# Patient Record
Sex: Female | Born: 1980 | Race: Black or African American | Hispanic: No | Marital: Single | Smoking: Never smoker
Health system: Southern US, Community
[De-identification: ages and names within clinical notes are randomized; demographics above are authoritative.]

## PROBLEM LIST (undated history)

## (undated) DIAGNOSIS — R011 Cardiac murmur, unspecified: Secondary | ICD-10-CM

## (undated) DIAGNOSIS — F329 Major depressive disorder, single episode, unspecified: Secondary | ICD-10-CM

## (undated) DIAGNOSIS — O159 Eclampsia, unspecified as to time period: Secondary | ICD-10-CM

## (undated) DIAGNOSIS — I10 Essential (primary) hypertension: Secondary | ICD-10-CM

## (undated) DIAGNOSIS — E669 Obesity, unspecified: Secondary | ICD-10-CM

## (undated) DIAGNOSIS — R7989 Other specified abnormal findings of blood chemistry: Secondary | ICD-10-CM

## (undated) DIAGNOSIS — F419 Anxiety disorder, unspecified: Secondary | ICD-10-CM

## (undated) DIAGNOSIS — T8859XA Other complications of anesthesia, initial encounter: Secondary | ICD-10-CM

## (undated) DIAGNOSIS — R682 Dry mouth, unspecified: Secondary | ICD-10-CM

## (undated) DIAGNOSIS — N62 Hypertrophy of breast: Secondary | ICD-10-CM

## (undated) DIAGNOSIS — E559 Vitamin D deficiency, unspecified: Secondary | ICD-10-CM

## (undated) DIAGNOSIS — J45909 Unspecified asthma, uncomplicated: Secondary | ICD-10-CM

## (undated) DIAGNOSIS — B9689 Other specified bacterial agents as the cause of diseases classified elsewhere: Secondary | ICD-10-CM

## (undated) DIAGNOSIS — M549 Dorsalgia, unspecified: Secondary | ICD-10-CM

## (undated) DIAGNOSIS — F32A Depression, unspecified: Secondary | ICD-10-CM

## (undated) DIAGNOSIS — D649 Anemia, unspecified: Secondary | ICD-10-CM

## (undated) DIAGNOSIS — Z789 Other specified health status: Secondary | ICD-10-CM

## (undated) DIAGNOSIS — T7840XA Allergy, unspecified, initial encounter: Secondary | ICD-10-CM

## (undated) DIAGNOSIS — Z91410 Personal history of adult physical and sexual abuse: Secondary | ICD-10-CM

## (undated) DIAGNOSIS — I499 Cardiac arrhythmia, unspecified: Secondary | ICD-10-CM

## (undated) DIAGNOSIS — R945 Abnormal results of liver function studies: Secondary | ICD-10-CM

## (undated) DIAGNOSIS — N760 Acute vaginitis: Secondary | ICD-10-CM

## (undated) DIAGNOSIS — R569 Unspecified convulsions: Secondary | ICD-10-CM

## (undated) HISTORY — DX: Allergy, unspecified, initial encounter: T78.40XA

## (undated) HISTORY — DX: Obesity, unspecified: E66.9

## (undated) HISTORY — DX: Major depressive disorder, single episode, unspecified: F32.9

## (undated) HISTORY — DX: Hypertrophy of breast: N62

## (undated) HISTORY — DX: Other specified bacterial agents as the cause of diseases classified elsewhere: B96.89

## (undated) HISTORY — DX: Vitamin D deficiency, unspecified: E55.9

## (undated) HISTORY — DX: Abnormal results of liver function studies: R94.5

## (undated) HISTORY — DX: Other specified bacterial agents as the cause of diseases classified elsewhere: N76.0

## (undated) HISTORY — DX: Dry mouth, unspecified: R68.2

## (undated) HISTORY — DX: Eclampsia, unspecified as to time period: O15.9

## (undated) HISTORY — DX: Essential (primary) hypertension: I10

## (undated) HISTORY — DX: Anxiety disorder, unspecified: F41.9

## (undated) HISTORY — DX: Unspecified asthma, uncomplicated: J45.909

## (undated) HISTORY — PX: EYE SURGERY: SHX253

## (undated) HISTORY — DX: Other specified health status: Z78.9

## (undated) HISTORY — DX: Depression, unspecified: F32.A

## (undated) HISTORY — DX: Other specified abnormal findings of blood chemistry: R79.89

## (undated) HISTORY — DX: Dorsalgia, unspecified: M54.9

## (undated) HISTORY — PX: TUBAL LIGATION: SHX77

## (undated) HISTORY — DX: Cardiac murmur, unspecified: R01.1

## (undated) HISTORY — DX: Personal history of adult physical and sexual abuse: Z91.410

---

## 2002-02-10 HISTORY — PX: EYE SURGERY: SHX253

## 2004-05-08 ENCOUNTER — Emergency Department: Payer: Self-pay | Admitting: Emergency Medicine

## 2005-04-15 ENCOUNTER — Ambulatory Visit: Payer: Self-pay

## 2005-04-16 ENCOUNTER — Ambulatory Visit: Payer: Self-pay

## 2008-10-09 ENCOUNTER — Emergency Department: Payer: Self-pay | Admitting: Emergency Medicine

## 2009-03-13 ENCOUNTER — Emergency Department: Payer: Self-pay | Admitting: Emergency Medicine
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
## 2010-01-13 ENCOUNTER — Ambulatory Visit: Payer: Self-pay | Admitting: Internal Medicine

## 2010-01-16 ENCOUNTER — Ambulatory Visit: Payer: Self-pay | Admitting: Family Medicine

## 2010-01-16 IMAGING — CR DG LUMBAR SPINE COMPLETE 4+V
1 series · 5 of 5 positions shown · non-contrast
Comparison: none

REASON FOR EXAM: low back pain
COMMENTS:

[Series 2: view not recorded · 0.17mm/px · 5 of 5 slices shown]
[im 1/5]
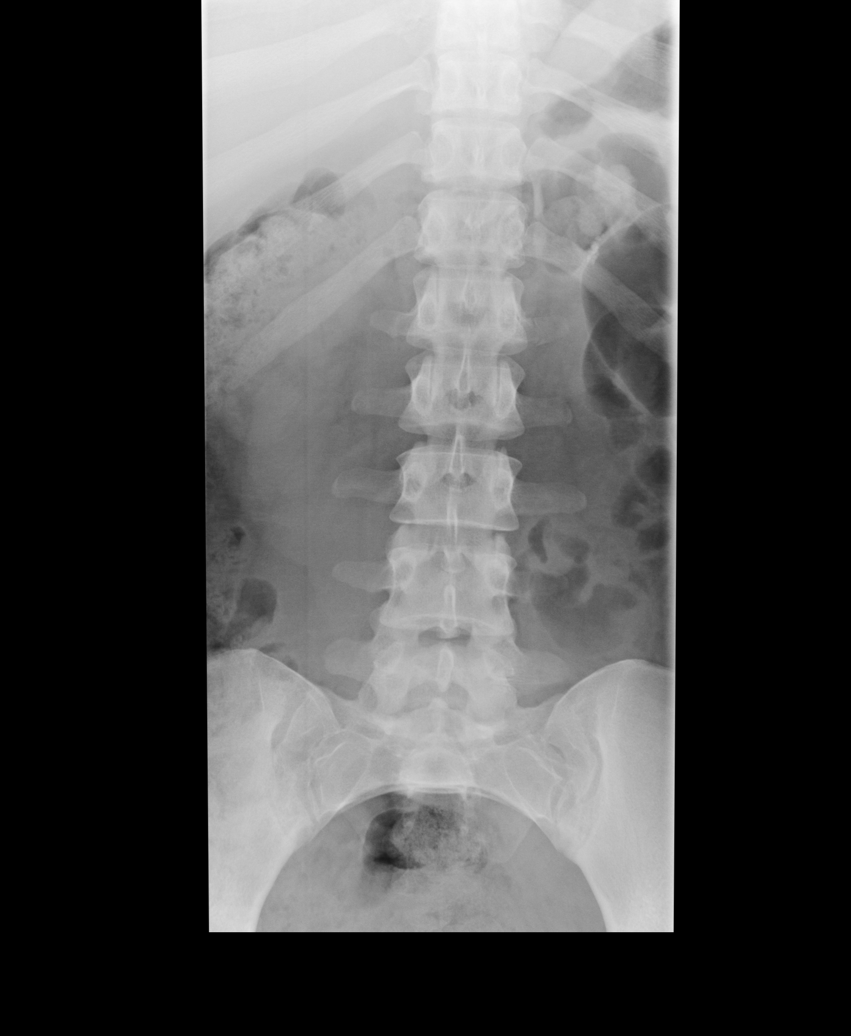
[im 2/5]
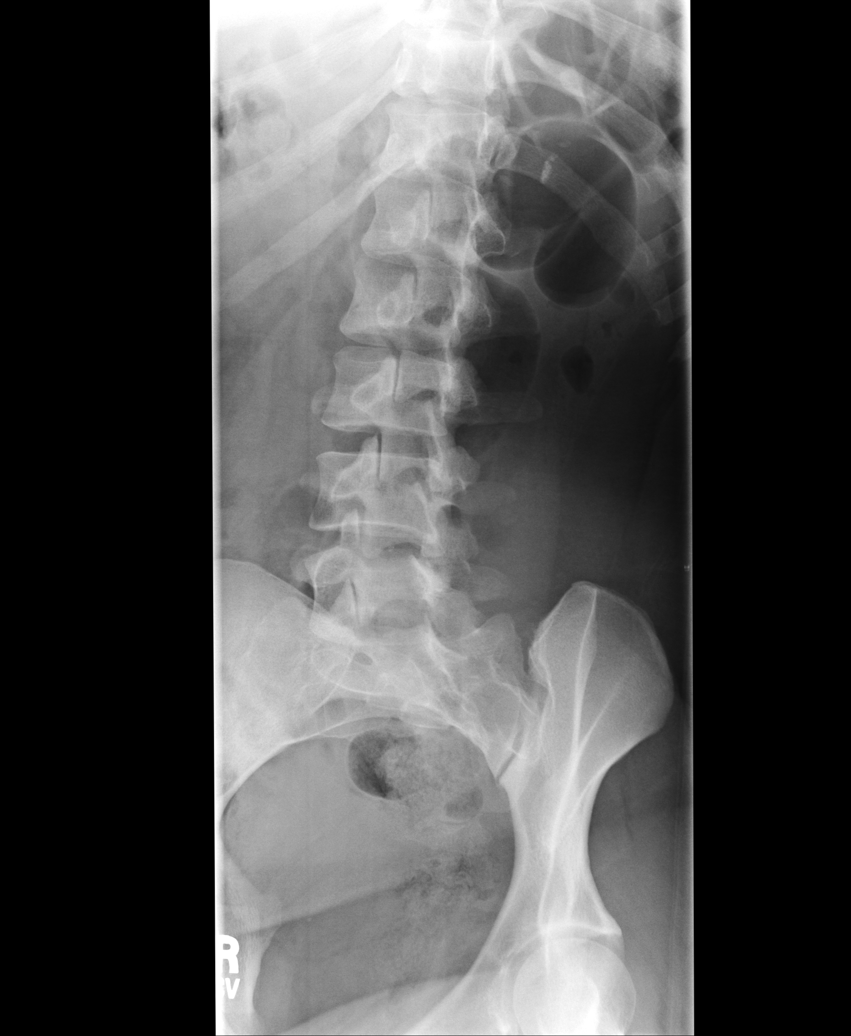
[im 3/5]
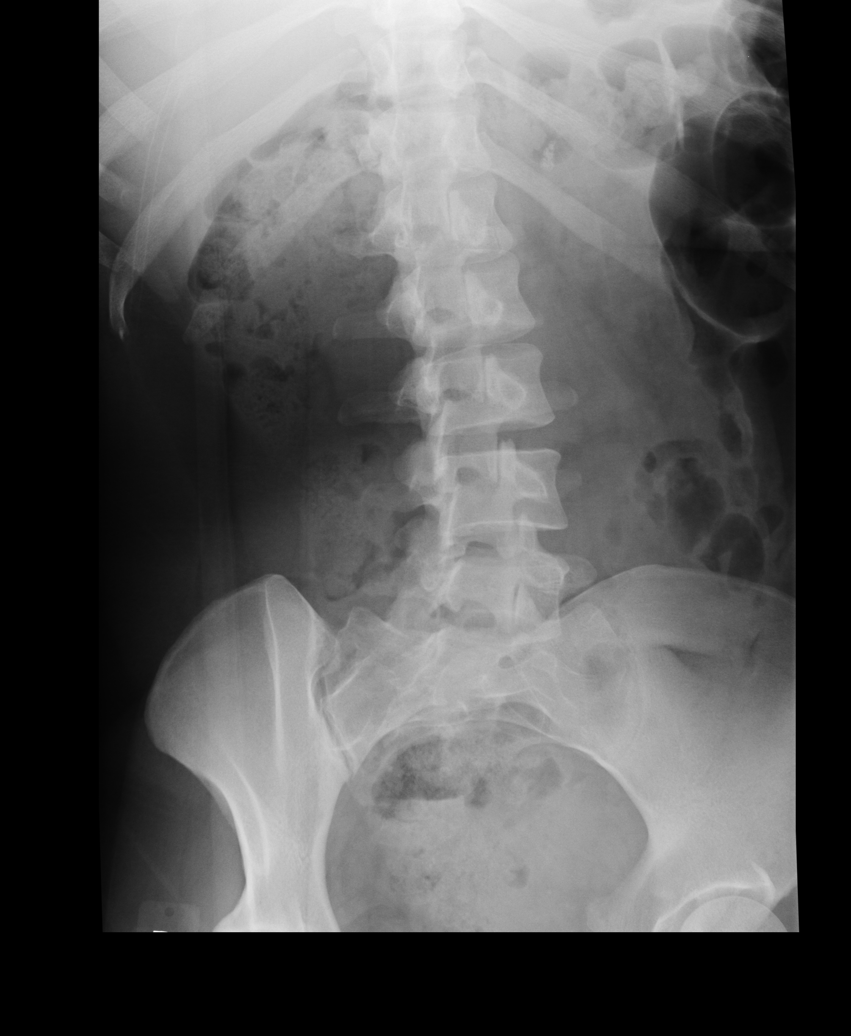
[im 4/5]
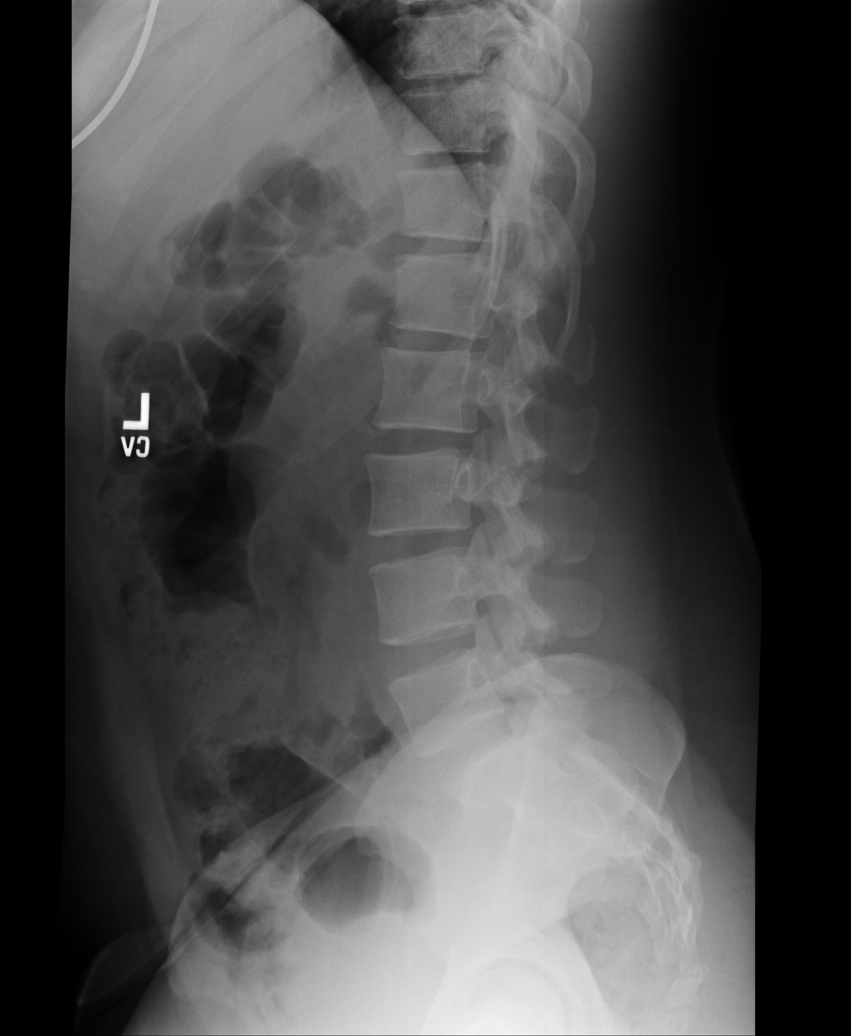
[im 5/5]
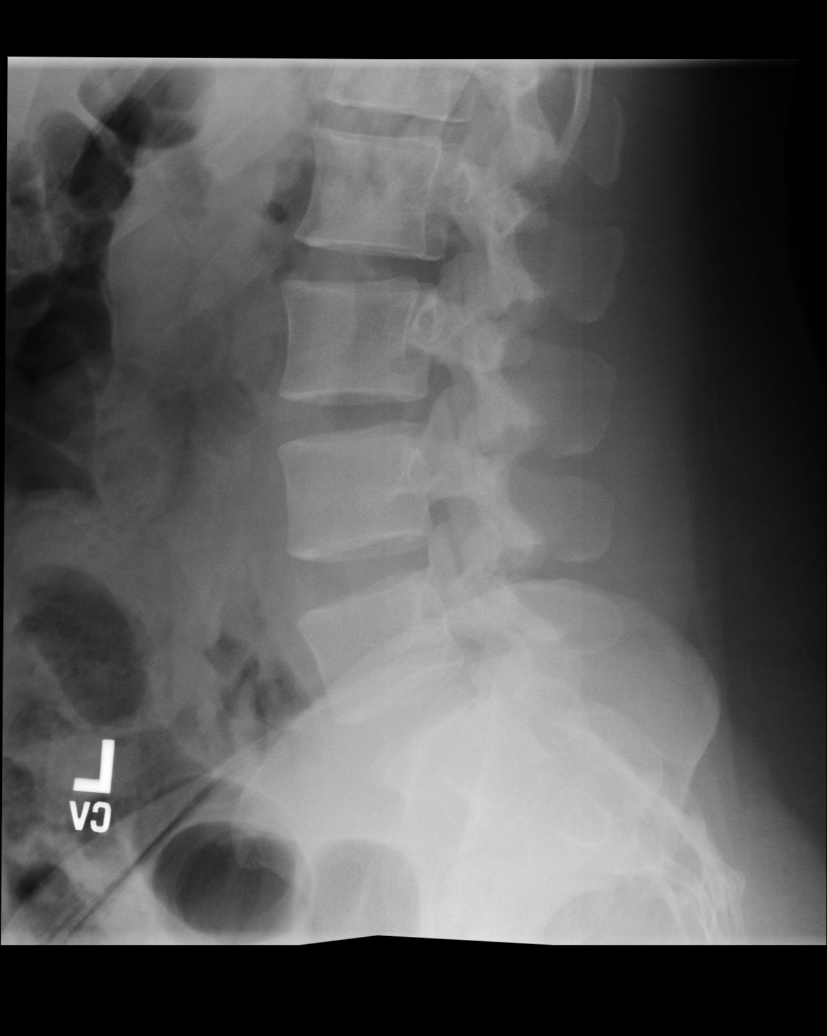

[5 of 5 positions shown; findings below may reference images not displayed]

PROCEDURE:     KDR - KDXR LUMBAR SPINE WITH OBLIQUES  - [DATE] [DATE]

RESULT:     The vertebral body heights and the intervertebral disc spaces
are well maintained. The vertebral body alignment is normal. Oblique views
show no significant abnormalities of the articular facets. The pedicles and
transverse processes are normal in appearance.
IMPRESSION: 1.     No significant abnormalities are noted.

## 2010-01-23 ENCOUNTER — Ambulatory Visit: Payer: Self-pay | Admitting: Family Medicine

## 2012-02-26 LAB — LIPID PANEL
CHOLESTEROL: 143 mg/dL (ref 0–200)
HDL: 44 mg/dL (ref 35–70)
LDL Cholesterol: 86 mg/dL
Triglycerides: 63 mg/dL (ref 40–160)

## 2012-02-26 LAB — HM PAP SMEAR: HM PAP: NORMAL

## 2012-09-29 ENCOUNTER — Emergency Department: Payer: Self-pay | Admitting: Internal Medicine
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
## 2014-12-15 ENCOUNTER — Encounter: Payer: Self-pay | Admitting: Family Medicine

## 2014-12-15 ENCOUNTER — Ambulatory Visit (INDEPENDENT_AMBULATORY_CARE_PROVIDER_SITE_OTHER): Payer: PRIVATE HEALTH INSURANCE | Admitting: Family Medicine

## 2014-12-15 VITALS — BP 132/80 | HR 98 | Temp 98.7°F | Resp 16 | Ht 60.0 in | Wt 193.5 lb

## 2014-12-15 DIAGNOSIS — N62 Hypertrophy of breast: Secondary | ICD-10-CM | POA: Diagnosis not present

## 2014-12-15 DIAGNOSIS — E669 Obesity, unspecified: Secondary | ICD-10-CM | POA: Diagnosis not present

## 2014-12-15 DIAGNOSIS — M546 Pain in thoracic spine: Secondary | ICD-10-CM

## 2014-12-15 DIAGNOSIS — R945 Abnormal results of liver function studies: Secondary | ICD-10-CM

## 2014-12-15 DIAGNOSIS — E559 Vitamin D deficiency, unspecified: Secondary | ICD-10-CM

## 2014-12-15 DIAGNOSIS — Z0000 Encounter for general adult medical examination without abnormal findings: Secondary | ICD-10-CM

## 2014-12-15 DIAGNOSIS — Z124 Encounter for screening for malignant neoplasm of cervix: Secondary | ICD-10-CM

## 2014-12-15 DIAGNOSIS — Z23 Encounter for immunization: Secondary | ICD-10-CM

## 2014-12-15 DIAGNOSIS — Z01419 Encounter for gynecological examination (general) (routine) without abnormal findings: Secondary | ICD-10-CM

## 2014-12-15 DIAGNOSIS — Z8759 Personal history of other complications of pregnancy, childbirth and the puerperium: Secondary | ICD-10-CM | POA: Insufficient documentation

## 2014-12-15 DIAGNOSIS — R7989 Other specified abnormal findings of blood chemistry: Secondary | ICD-10-CM | POA: Diagnosis not present

## 2014-12-15 DIAGNOSIS — I10 Essential (primary) hypertension: Secondary | ICD-10-CM | POA: Insufficient documentation

## 2014-12-15 NOTE — Progress Notes (Signed)
Name: Anita Newton   MRN: [id number]    DOB: May 05, 1980   Date:12/15/2014       Progress Note  Subjective  Chief Complaint  Chief Complaint  Patient presents with  . Annual Exam    HPI  Well woman : she states BV has been under control. S/p tubal ligation, no dyspareunia.   Large Breast: she is now working as a Corporate treasurer and her breast has been causing her to have a lot of back pain. Bra size is a G. She is wearing sports bra with under-wire and adjustable straps to control her symptoms, and at the end of the day she has indentation on both shoulder. Pain goes from mid back to her neck, pain is described as burning/pull sensation and feels tight.    Obesity: weight has been stable, working days and night shifts. Unable to lose weight. She tries to eat healthy.   Patient Active Problem List   Diagnosis Date Noted  . History of eclampsia 12/15/2014  . Elevated liver function tests 12/15/2014  . History of hypertension 12/15/2014  . Large breasts 12/15/2014  . Obesity (BMI 35.0-39.9 without comorbidity) ([place]) 12/15/2014  . Thoracic back pain 12/15/2014  . Vitamin D deficiency 12/15/2014    Past Surgical History  Procedure Laterality Date  . Tubal ligation    . Eye surgery      Family History  Problem Relation Age of Onset  . Cancer Mother     eye tumor  . Diabetes Mother   . Lupus Mother   . Alcohol abuse Father   . Drug abuse Father   . Eczema Daughter   . ADD / ADHD Daughter   . ADD / ADHD Son   . Allergic Disorder Son   . Lupus Sister   . Depression Sister   . Hypertension Brother     Social History   Social History  . Marital Status: Single    Spouse Name: N/A  . Number of Children: N/A  . Years of Education: N/A   Occupational History  . Not on file.   Social History Main Topics  . Smoking status: Never Smoker   . Smokeless tobacco: Never Used  . Alcohol Use: No  . Drug Use: No  . Sexual Activity: Not Currently   Other Topics  Concern  . Not on file   Social History Narrative  . No narrative on file     Current outpatient prescriptions:  Marland Kitchen  Multiple Vitamin (MULTIVITAMIN) tablet, Take 1 tablet by mouth daily., Disp: , Rfl:  .  vitamin C (ASCORBIC ACID) 500 MG tablet, Take 500 mg by mouth daily., Disp: , Rfl:   Allergies  Allergen Reactions  . Cherry Hives and Swelling     ROS  Constitutional: Negative for fever or weight change.  Respiratory: Negative for cough and shortness of breath.   Cardiovascular: Negative for chest pain or palpitations.  Gastrointestinal: Negative for abdominal pain, no bowel changes.  Musculoskeletal: Negative for gait problem or joint swelling.  Skin: Negative for rash.  Neurological: Negative for dizziness or headache.  No other specific complaints in a complete review of systems (except as listed in HPI above).  Objective  Filed Vitals:   12/15/14 1431  BP: 132/80  Pulse: 98  Temp: 98.7 F (37.1 C)  TempSrc: Oral  Resp: 16  Height: 5' (1.524 m)  Weight: 193 lb 8 oz (87.771 kg)  SpO2: 95%    Body mass index is 37.79 kg/(m^2).  Physical Exam  Constitutional: Patient appears well-developed and well-nourished, obese No distress.  HENT: Head: Normocephalic and atraumatic. Ears: B TMs ok, no erythema or effusion; Nose: Nose normal. Mouth/Throat: Oropharynx is clear and moist. No oropharyngeal exudate.  Eyes: Conjunctivae and EOM are normal. Pupils are equal, round, and reactive to light. No scleral icterus.  Neck: Normal range of motion. Neck supple. No JVD present. No thyromegaly present.  Cardiovascular: Normal rate, regular rhythm and normal heart sounds.  No murmur heard. No BLE edema. Pulmonary/Chest: Effort normal and breath sounds normal. No respiratory distress. Abdominal: Soft. Bowel sounds are normal, no distension. There is no tenderness. no masses Breast: no lumps or masses, no nipple discharge or rashes, large pendulous breast FEMALE GENITALIA:   External genitalia normal External urethra normal Vaginal vault normal without discharge or lesions Cervix normal without discharge or lesions Bimanual exam normal without masses RECTAL: no external hemorrhoids Musculoskeletal: Normal range of motion, no joint effusions. Indentation from bra on both shoulders, pain during palpation of paraspinal muscles of her thoracic spine, trapezium muscle also tender to touch Neurological: he is alert and oriented to person, place, and time. No cranial nerve deficit. Coordination, balance, strength, speech and gait are normal.  Skin: Skin is warm and dry. No rash noted. No erythema.  Psychiatric: Patient has a normal mood and affect. behavior is normal. Judgment and thought content normal.   PHQ2/9: Depression screen PHQ 2/9 12/15/2014  Decreased Interest 0  Down, Depressed, Hopeless 0  PHQ - 2 Score 0    Fall Risk: Fall Risk  12/15/2014  Falls in the past year? No    Functional Status Survey: Is the patient deaf or have difficulty hearing?: No Does the patient have difficulty seeing, even when wearing glasses/contacts?: Yes (glasses) Does the patient have difficulty concentrating, remembering, or making decisions?: No Does the patient have difficulty walking or climbing stairs?: No Does the patient have difficulty dressing or bathing?: No Does the patient have difficulty doing errands alone such as visiting a doctor's office or shopping?: No   Assessment & Plan  1. Well woman exam  Discussed importance of 150 minutes of physical activity weekly, eat two servings of fish weekly, eat one serving of tree nuts ( cashews, pistachios, pecans, almonds.Marland Kitchen) every other day, eat 6 servings of fruit/vegetables daily and drink plenty of water and avoid sweet beverages.   - HIV antibody - RPR - Comprehensive metabolic panel - CBC with Differential/Platelet - Lipid panel  2. Needs flu shot  - Flu Vaccine QUAD 36+ mos PF IM (Fluarix & Fluzone Quad  PF) -refused  3. Elevated liver function tests  Recheck labs  4. Large breasts  Discussed checking with insurance about coverage for mammoplasty, some insurances requires PT  5. Obesity (BMI 35.0-39.9 without comorbidity) ([place])  Discussed with the patient the risk posed by an increased BMI. Discussed importance of portion control, calorie counting and at least 150 minutes of physical activity weekly. Avoid sweet beverages and drink more water. Eat at least 6 servings of fruit and vegetables daily   6. Bilateral thoracic back pain  Likely from large breast, refer to PT  7. Vitamin D deficiency  - Vit D  25 hydroxy (rtn osteoporosis monitoring)  8. Cervical cancer screening  - Pap IG, CT/NG NAA, and HPV (high risk)

## 2014-12-18 ENCOUNTER — Telehealth: Payer: Self-pay | Admitting: Family Medicine

## 2014-12-18 NOTE — Telephone Encounter (Signed)
Pt needs a call back about a referral for breast reduction. Pt was told to call her insurance and that the Dr has to submit a prior approval first.

## 2014-12-18 NOTE — Telephone Encounter (Signed)
Please call patient, she will likely need to give me the name and number of the plastic surgeon  that she will see, and we can send our notes

## 2014-12-20 ENCOUNTER — Other Ambulatory Visit: Payer: Self-pay

## 2014-12-20 DIAGNOSIS — E559 Vitamin D deficiency, unspecified: Secondary | ICD-10-CM

## 2014-12-20 DIAGNOSIS — Z299 Encounter for prophylactic measures, unspecified: Secondary | ICD-10-CM

## 2014-12-20 LAB — LIPID PANEL
Cholesterol: 168 mg/dL (ref 0–200)
HDL: 55 mg/dL (ref 35–70)
LDL Cholesterol: 100 mg/dL
Triglycerides: 63 mg/dL (ref 40–160)

## 2014-12-20 NOTE — Addendum Note (Signed)
Addended by: Rudene Anda T on: 12/20/2014 08:41 AM   Modules accepted: Orders

## 2014-12-20 NOTE — Telephone Encounter (Signed)
Pt notified and will call back once she confirms who will be doing this procedure for her.

## 2014-12-20 NOTE — Progress Notes (Signed)
Patient came in to have blood drawn per her physician, Dr. Ancil Boozer.  Blood was drawn from right arm without any incident.  Pt wants results sent to Dr. Steele Sizer.

## 2014-12-20 NOTE — Telephone Encounter (Signed)
Pt called back and states her plastic surgeon Dr will be Dr Nicholaus Bloom at [facility]. Fax # is [phone]. Phone # is [phone].

## 2014-12-20 NOTE — Telephone Encounter (Signed)
Please send notes as requested. Thank you

## 2014-12-21 ENCOUNTER — Encounter: Payer: Self-pay | Admitting: Family Medicine

## 2014-12-21 DIAGNOSIS — F329 Major depressive disorder, single episode, unspecified: Secondary | ICD-10-CM | POA: Insufficient documentation

## 2014-12-21 DIAGNOSIS — F419 Anxiety disorder, unspecified: Secondary | ICD-10-CM | POA: Insufficient documentation

## 2014-12-21 LAB — CMP12+LP+TP+TSH+6AC+CBC/D/PLT
A/G RATIO: 1.4 (ref 1.1–2.5)
ALK PHOS: 73 IU/L (ref 39–117)
ALT: 24 IU/L (ref 0–32)
AST: 21 IU/L (ref 0–40)
Albumin: 4.2 g/dL (ref 3.5–5.5)
BASOS: 1 %
BILIRUBIN TOTAL: 0.3 mg/dL (ref 0.0–1.2)
BUN / CREAT RATIO: 17 (ref 8–20)
BUN: 12 mg/dL (ref 6–20)
Basophils Absolute: 0 10*3/uL (ref 0.0–0.2)
CHOL/HDL RATIO: 3.1 ratio (ref 0.0–4.4)
CREATININE: 0.72 mg/dL (ref 0.57–1.00)
Calcium: 9.2 mg/dL (ref 8.7–10.2)
Chloride: 99 mmol/L (ref 97–106)
Cholesterol, Total: 168 mg/dL (ref 100–199)
EOS (ABSOLUTE): 0.2 10*3/uL (ref 0.0–0.4)
EOS: 2 %
Estimated CHD Risk: <0.5 times avg. (ref 0.0–1.0)
Free Thyroxine Index: 2.6 (ref 1.2–4.9)
GFR calc Af Amer: 125 mL/min/{1.73_m2} (ref >59)
GFR, EST NON AFRICAN AMERICAN: 109 mL/min/{1.73_m2} (ref >59)
GGT: 30 IU/L (ref 0–60)
GLOBULIN, TOTAL: 3 g/dL (ref 1.5–4.5)
Glucose: 85 mg/dL (ref 65–99)
HDL: 55 mg/dL (ref >39)
HEMATOCRIT: 32.7 % — AB (ref 34.0–46.6)
Hemoglobin: 11 g/dL — ABNORMAL LOW (ref 11.1–15.9)
Immature Grans (Abs): 0 10*3/uL (ref 0.0–0.1)
Immature Granulocytes: 0 %
Iron: 62 ug/dL (ref 27–159)
LDH: 166 IU/L (ref 119–226)
LDL CALC: 100 mg/dL — AB (ref 0–99)
LYMPHS ABS: 2.5 10*3/uL (ref 0.7–3.1)
Lymphs: 39 %
MCH: 28.1 pg (ref 26.6–33.0)
MCHC: 33.6 g/dL (ref 31.5–35.7)
MCV: 84 fL (ref 79–97)
MONOS ABS: 0.5 10*3/uL (ref 0.1–0.9)
Monocytes: 8 %
NEUTROS PCT: 50 %
Neutrophils Absolute: 3.3 10*3/uL (ref 1.4–7.0)
PLATELETS: 365 10*3/uL (ref 150–379)
Phosphorus: 3.5 mg/dL (ref 2.5–4.5)
Potassium: 4.4 mmol/L (ref 3.5–5.2)
RBC: 3.91 x10E6/uL (ref 3.77–5.28)
RDW: 13.8 % (ref 12.3–15.4)
SODIUM: 136 mmol/L (ref 136–144)
T3 Uptake Ratio: 26 % (ref 24–39)
T4 TOTAL: 10.1 ug/dL (ref 4.5–12.0)
TRIGLYCERIDES: 63 mg/dL (ref 0–149)
TSH: 2.2 u[IU]/mL (ref 0.450–4.500)
Total Protein: 7.2 g/dL (ref 6.0–8.5)
Uric Acid: 5.6 mg/dL (ref 2.5–7.1)
VLDL Cholesterol Cal: 13 mg/dL (ref 5–40)
WBC: 6.5 10*3/uL (ref 3.4–10.8)

## 2014-12-21 LAB — VITAMIN D 25 HYDROXY (VIT D DEFICIENCY, FRACTURES): VIT D 25 HYDROXY: 16.1 ng/mL — AB (ref 30.0–100.0)

## 2014-12-21 LAB — HIV ANTIBODY (ROUTINE TESTING W REFLEX): HIV SCREEN 4TH GENERATION: NONREACTIVE

## 2014-12-21 LAB — RPR QUALITATIVE: RPR: NONREACTIVE

## 2014-12-21 NOTE — Telephone Encounter (Signed)
Notes have been faxed to the # below

## 2014-12-22 ENCOUNTER — Ambulatory Visit (INDEPENDENT_AMBULATORY_CARE_PROVIDER_SITE_OTHER): Payer: PRIVATE HEALTH INSURANCE | Admitting: Family Medicine

## 2014-12-22 ENCOUNTER — Encounter: Payer: Self-pay | Admitting: Family Medicine

## 2014-12-22 VITALS — BP 134/92 | HR 91 | Temp 98.5°F | Resp 18 | Ht 60.0 in | Wt 194.5 lb

## 2014-12-22 DIAGNOSIS — I10 Essential (primary) hypertension: Secondary | ICD-10-CM

## 2014-12-22 DIAGNOSIS — N920 Excessive and frequent menstruation with regular cycle: Secondary | ICD-10-CM

## 2014-12-22 DIAGNOSIS — D649 Anemia, unspecified: Secondary | ICD-10-CM

## 2014-12-22 DIAGNOSIS — E559 Vitamin D deficiency, unspecified: Secondary | ICD-10-CM

## 2014-12-22 LAB — PAP IG, CT-NG NAA, HPV HIGH-RISK
HPV, HIGH-RISK: NEGATIVE
PAP Smear Comment: 0

## 2014-12-22 MED ORDER — NORGESTIMATE-ETH ESTRADIOL 0.25-35 MG-MCG PO TABS: 1.0000 | ORAL_TABLET | Freq: Every day | ORAL | Status: DC

## 2014-12-22 MED ORDER — VITAMIN D (ERGOCALCIFEROL) 1.25 MG (50000 UNIT) PO CAPS: 50000.0000 [IU] | ORAL_CAPSULE | ORAL | Status: DC

## 2014-12-22 MED ORDER — FERROUS SULFATE 325 (65 FE) MG PO TABS: 325.0000 mg | ORAL_TABLET | Freq: Two times a day (BID) | ORAL | Status: DC

## 2014-12-22 NOTE — Progress Notes (Signed)
Lab results were faxed to Dr. Ancil Boozer at [facility] per patient's request.

## 2014-12-22 NOTE — Progress Notes (Signed)
Name: Anita Newton   MRN: [id number]    DOB: [birth year]   Date:12/22/2014       Progress Note  Subjective  Chief Complaint  Chief Complaint  Patient presents with  . Labs Only    discuss labs low vitamin D    HPI  Vitamin D deficiency: level was 16, she is tired all the time, no bone pains.  Anemia: hgb was 11, she has fatigue. She has an iron rich diet  Menorrhagia: she had tubal ligation in 2003 and stopped using contraception. The following year she developed heavy cycles, with clots for 5 days and light for a couple more days. She is tired of bleeding and would like to consider going back on ocp's.   Large Breast: having back pain, referral made for PT, will start on Monday, will likely benefit from breast reduction surgery  Patient Active Problem List   Diagnosis Date Noted  . Adiposity 12/21/2014  . Anxiety and depression 12/21/2014  . History of eclampsia 12/15/2014  . Hypertension, benign 12/15/2014  . Large breasts 12/15/2014  . Obesity (BMI 35.0-39.9 without comorbidity) ([place]) 12/15/2014  . Thoracic back pain 12/15/2014  . Vitamin D deficiency 12/15/2014    Past Surgical History  Procedure Laterality Date  . Tubal ligation    . Eye surgery      Family History  Problem Relation Age of Onset  . Cancer Mother     eye tumor  . Diabetes Mother   . Lupus Mother   . Alcohol abuse Father   . Drug abuse Father   . Eczema Daughter   . ADD / ADHD Daughter   . ADD / ADHD Son   . Allergic Disorder Son   . Lupus Sister   . Depression Sister   . Hypertension Brother     Social History   Social History  . Marital Status: Single    Spouse Name: N/A  . Number of Children: N/A  . Years of Education: N/A   Occupational History  . Not on file.   Social History Main Topics  . Smoking status: Never Smoker   . Smokeless tobacco: Never Used  . Alcohol Use: No  . Drug Use: No  . Sexual Activity: Not Currently   Other Topics Concern  . Not on file    Social History Narrative     Current outpatient prescriptions:  .  ibuprofen (ADVIL,MOTRIN) 600 MG tablet, Take by mouth., Disp: , Rfl:  .  ferrous sulfate 325 (65 FE) MG tablet, Take 1 tablet (325 mg total) by mouth 2 (two) times daily with a meal., Disp: 60 tablet, Rfl: 2 .  Multiple Vitamin (MULTIVITAMIN) tablet, Take 1 tablet by mouth daily., Disp: , Rfl:  .  norgestimate-ethinyl estradiol (ORTHO-CYCLEN, 28,) 0.25-35 MG-MCG tablet, Take 1 tablet by mouth daily., Disp: 1 Package, Rfl: 11 .  vitamin C (ASCORBIC ACID) 500 MG tablet, Take 500 mg by mouth daily., Disp: , Rfl:  .  Vitamin D, Ergocalciferol, (DRISDOL) 50000 UNITS CAPS capsule, Take 1 capsule (50,000 Units total) by mouth every 7 (seven) days., Disp: 12 capsule, Rfl: 0  Allergies  Allergen Reactions  . Cherry Hives and Swelling     ROS  Ten systems reviewed and is negative except as mentioned in HPI   Objective  Filed Vitals:   12/22/14 1145  BP: 136/98  Pulse: 91  Temp: 98.5 F (36.9 C)  TempSrc: Oral  Resp: 18  Height: 5' (1.524 m)  Weight: 194  lb 8 oz (88.225 kg)  SpO2: 95%    Body mass index is 37.99 kg/(m^2).  Physical Exam  Constitutional: Patient appears well-developed and well-nourished. ObeseNo distress.  HEENT: head atraumatic, normocephalic, pupils equal and reactive to light,aneck supple, but tender to palpation posteriorly , throat within normal limits Cardiovascular: Normal rate, regular rhythm and normal heart sounds.  No murmur heard. No BLE edema. Pulmonary/Chest: Effort normal and breath sounds normal. No respiratory distress. Abdominal: Soft.  There is no tenderness. Psychiatric: Patient has a normal mood and affect. behavior is normal. Judgment and thought content normal.  Recent Results (from the past 2160 hour(s))  Pap IG, CT/NG NAA, and HPV (high risk)     Status: None   Collection Time: 12/18/14 12:00 AM  Result Value Ref Range   DIAGNOSIS: Comment     Comment: NEGATIVE FOR  INTRAEPITHELIAL LESION AND MALIGNANCY.   Specimen adequacy: Comment     Comment: Satisfactory for evaluation. Endocervical and/or squamous metaplastic cells (endocervical component) are present.    CLINICIAN PROVIDED ICD10: Comment     Comment: Z12.4   Performed by: Comment     Comment: Windell Norfolk, Cytotechnologist (ASCP)   PAP SMEAR COMMENT .    Note: Comment     Comment: The Pap smear is a screening test designed to aid in the detection of premalignant and malignant conditions of the uterine cervix.  It is not a diagnostic procedure and should not be used as the sole means of detecting cervical cancer.  Both false-positive and false-negative reports do occur.    Test Methodology Comment     Comment: This liquid based ThinPrep(R) pap test was screened with the use of an image guided system.    HPV, high-risk Negative Negative    Comment: This high-risk HPV test detects thirteen high-risk types (16/18/31/33/35/39/45/51/52/56/58/59/68) without differentiation.   Executive Panel     Status: Abnormal   Collection Time: 12/20/14  8:15 AM  Result Value Ref Range   Glucose 85 65 - 99 mg/dL   Uric Acid 5.6 2.5 - 7.1 mg/dL    Comment:            Therapeutic target for gout patients: <6.0   BUN 12 6 - 20 mg/dL   Creatinine, Ser 0.72 0.57 - 1.00 mg/dL   GFR calc non Af Amer 109 >59 mL/min/1.73   GFR calc Af Amer 125 >59 mL/min/1.73   BUN/Creatinine Ratio 17 8 - 20   Sodium 136 136 - 144 mmol/L   Potassium 4.4 3.5 - 5.2 mmol/L   Chloride 99 97 - 106 mmol/L   Calcium 9.2 8.7 - 10.2 mg/dL   Phosphorus 3.5 2.5 - 4.5 mg/dL   Total Protein 7.2 6.0 - 8.5 g/dL   Albumin 4.2 3.5 - 5.5 g/dL   Globulin, Total 3.0 1.5 - 4.5 g/dL   Albumin/Globulin Ratio 1.4 1.1 - 2.5   Bilirubin Total 0.3 0.0 - 1.2 mg/dL   Alkaline Phosphatase 73 39 - 117 IU/L   LDH 166 119 - 226 IU/L   AST 21 0 - 40 IU/L   ALT 24 0 - 32 IU/L   GGT 30 0 - 60 IU/L   Iron 62 27 - 159 ug/dL   Cholesterol, Total 168 100 -  199 mg/dL   Triglycerides 63 0 - 149 mg/dL   HDL 55 >39 mg/dL    Comment: According to ATP-III Guidelines, HDL-C >59 mg/dL is considered a negative risk factor for CHD.    VLDL Cholesterol  Cal 13 5 - 40 mg/dL   LDL Calculated 100 (H) 0 - 99 mg/dL   Chol/HDL Ratio 3.1 0.0 - 4.4 ratio units    Comment:                                   T. Chol/HDL Ratio                                             Men  Women                               1/2 Avg.Risk  3.4    3.3                                   Avg.Risk  5.0    4.4                                2X Avg.Risk  9.6    7.1                                3X Avg.Risk 23.4   11.0    Estimated CHD Risk  < 0.5 0.0 - 1.0  times avg.    Comment:                                   T. Chol/HDL Ratio                                             Men  Women                               1/2 Avg.Risk  3.4    3.3                                   Avg.Risk  5.0    4.4                                2X Avg.Risk  9.6    7.1                                3X Avg.Risk 23.4   11.0 The CHD Risk is based on the T. Chol/HDL ratio.  Other factors affect CHD Risk such as hypertension, smoking, diabetes, severe obesity, and family history of pre- mature CHD.    TSH 2.200 0.450 - 4.500 uIU/mL   T4, Total 10.1 4.5 - 12.0 ug/dL   T3 Uptake Ratio 26 24 - 39 %   Free Thyroxine Index 2.6 1.2 - 4.9   WBC 6.5 3.4 -  10.8 x10E3/uL   RBC 3.91 3.77 - 5.28 x10E6/uL   Hemoglobin 11.0 (L) 11.1 - 15.9 g/dL   Hematocrit 32.7 (L) 34.0 - 46.6 %   MCV 84 79 - 97 fL   MCH 28.1 26.6 - 33.0 pg   MCHC 33.6 31.5 - 35.7 g/dL   RDW 13.8 12.3 - 15.4 %   Platelets 365 150 - 379 x10E3/uL   Neutrophils 50 %   Lymphs 39 %   Monocytes 8 %   Eos 2 %   Basos 1 %   Neutrophils Absolute 3.3 1.4 - 7.0 x10E3/uL   Lymphocytes Absolute 2.5 0.7 - 3.1 x10E3/uL   Monocytes Absolute 0.5 0.1 - 0.9 x10E3/uL   EOS (ABSOLUTE) 0.2 0.0 - 0.4 x10E3/uL   Basophils Absolute 0.0 0.0 - 0.2 x10E3/uL    Immature Granulocytes 0 %   Immature Grans (Abs) 0.0 0.0 - 0.1 x10E3/uL  Vitamin D (25 hydroxy)     Status: Abnormal   Collection Time: 12/20/14  8:15 AM  Result Value Ref Range   Vit D, 25-Hydroxy 16.1 (L) 30.0 - 100.0 ng/mL    Comment: Vitamin D deficiency has been defined by the [place] and an Endocrine Society practice guideline as a level of serum 25-OH vitamin D less than 20 ng/mL (1,2). The Endocrine Society went on to further define vitamin D insufficiency as a level between 21 and 29 ng/mL (2). 1. IOM (Institute of Medicine). 2010. Dietary reference    intakes for calcium and D. [place]: The    [employer]. 2. Holick MF, Binkley [place], Bischoff-Ferrari HA, et al.    Evaluation, treatment, and prevention of vitamin D    deficiency: an Endocrine Society clinical practice    guideline. JCEM. 2011 Jul; 96(7):1911-30.   RPR     Status: None   Collection Time: 12/20/14  8:15 AM  Result Value Ref Range   RPR Ser Ql Non Reactive Non Reactive  HIV antibody     Status: None   Collection Time: 12/20/14  8:15 AM  Result Value Ref Range   HIV Screen 4th Generation wRfx Non Reactive Non Reactive   PHQ2/9: Depression screen PHQ 2/9 12/15/2014  Decreased Interest 0  Down, Depressed, Hopeless 0  PHQ - 2 Score 0     Fall Risk: Fall Risk  12/15/2014  Falls in the past year? No     Assessment & Plan  1. Hypertension, benign  She wants to hold off on resuming medication. She will monitor her bp at home and return sooner for follow up if needed.  2. Vitamin D deficiency  - Vitamin D, Ergocalciferol, (DRISDOL) 50000 UNITS CAPS capsule; Take 1 capsule (50,000 Units total) by mouth every 7 (seven) days.  Dispense: 12 capsule; Refill: 0  3. Anemia, unspecified anemia type  - Ferritin - Iron - ferrous sulfate 325 (65 FE) MG tablet; Take 1 tablet (325 mg total) by mouth 2 (two) times daily with a meal.  Dispense: 60 tablet; Refill: 2  4. Menorrhagia  with regular cycle  - norgestimate-ethinyl estradiol (ORTHO-CYCLEN, 28,) 0.25-35 MG-MCG tablet; Take 1 tablet by mouth daily.  Dispense: 1 Package; Refill: 11

## 2014-12-25 ENCOUNTER — Ambulatory Visit: Payer: Self-pay | Admitting: Physical Therapy

## 2014-12-25 ENCOUNTER — Ambulatory Visit: Payer: PRIVATE HEALTH INSURANCE | Attending: Family Medicine | Admitting: Physical Therapy

## 2014-12-25 ENCOUNTER — Encounter: Payer: Self-pay | Admitting: Physical Therapy

## 2014-12-25 DIAGNOSIS — M2560 Stiffness of unspecified joint, not elsewhere classified: Secondary | ICD-10-CM | POA: Insufficient documentation

## 2014-12-25 DIAGNOSIS — M546 Pain in thoracic spine: Secondary | ICD-10-CM | POA: Diagnosis not present
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
## 2015-01-01 ENCOUNTER — Other Ambulatory Visit: Payer: Self-pay

## 2015-01-01 DIAGNOSIS — D649 Anemia, unspecified: Secondary | ICD-10-CM

## 2015-01-03 ENCOUNTER — Encounter: Payer: Self-pay | Admitting: Physical Therapy

## 2015-01-03 ENCOUNTER — Ambulatory Visit: Payer: PRIVATE HEALTH INSURANCE | Admitting: Physical Therapy

## 2015-01-03 DIAGNOSIS — M2560 Stiffness of unspecified joint, not elsewhere classified: Secondary | ICD-10-CM

## 2015-01-03 DIAGNOSIS — M546 Pain in thoracic spine: Secondary | ICD-10-CM | POA: Diagnosis not present
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
## 2015-01-08 ENCOUNTER — Ambulatory Visit: Payer: PRIVATE HEALTH INSURANCE | Admitting: Physical Therapy

## 2015-01-08 DIAGNOSIS — M546 Pain in thoracic spine: Secondary | ICD-10-CM

## 2015-01-08 DIAGNOSIS — M2560 Stiffness of unspecified joint, not elsewhere classified: Secondary | ICD-10-CM
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
## 2015-01-17 ENCOUNTER — Ambulatory Visit: Payer: PRIVATE HEALTH INSURANCE | Attending: Family Medicine | Admitting: Physical Therapy

## 2015-01-17 ENCOUNTER — Encounter: Payer: Self-pay | Admitting: Physical Therapy

## 2015-01-17 DIAGNOSIS — M546 Pain in thoracic spine: Secondary | ICD-10-CM | POA: Diagnosis not present

## 2015-01-17 DIAGNOSIS — M2560 Stiffness of unspecified joint, not elsewhere classified: Secondary | ICD-10-CM | POA: Insufficient documentation
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
## 2015-01-22 ENCOUNTER — Ambulatory Visit: Payer: PRIVATE HEALTH INSURANCE | Admitting: Physical Therapy

## 2015-01-22 ENCOUNTER — Encounter: Payer: PRIVATE HEALTH INSURANCE | Admitting: Physical Therapy

## 2015-01-22 ENCOUNTER — Encounter: Payer: Self-pay | Admitting: Physical Therapy

## 2015-01-22 DIAGNOSIS — M546 Pain in thoracic spine: Secondary | ICD-10-CM | POA: Diagnosis not present

## 2015-01-22 DIAGNOSIS — M2560 Stiffness of unspecified joint, not elsewhere classified: Secondary | ICD-10-CM
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
## 2015-01-28 ENCOUNTER — Emergency Department: Payer: PRIVATE HEALTH INSURANCE

## 2015-01-28 ENCOUNTER — Emergency Department
Admission: EM | Admit: 2015-01-28 | Discharge: 2015-01-29 | Disposition: A | Discharge status: Home / Self Care | Payer: PRIVATE HEALTH INSURANCE | Attending: Emergency Medicine | Admitting: Emergency Medicine

## 2015-01-28 DIAGNOSIS — I10 Essential (primary) hypertension: Secondary | ICD-10-CM | POA: Diagnosis not present

## 2015-01-28 DIAGNOSIS — J189 Pneumonia, unspecified organism: Secondary | ICD-10-CM

## 2015-01-28 DIAGNOSIS — J159 Unspecified bacterial pneumonia: Secondary | ICD-10-CM | POA: Diagnosis not present

## 2015-01-28 DIAGNOSIS — J9801 Acute bronchospasm: Secondary | ICD-10-CM | POA: Insufficient documentation

## 2015-01-28 DIAGNOSIS — Z79899 Other long term (current) drug therapy: Secondary | ICD-10-CM | POA: Diagnosis not present

## 2015-01-28 DIAGNOSIS — J069 Acute upper respiratory infection, unspecified: Secondary | ICD-10-CM | POA: Diagnosis not present

## 2015-01-28 DIAGNOSIS — Z791 Long term (current) use of non-steroidal anti-inflammatories (NSAID): Secondary | ICD-10-CM | POA: Insufficient documentation

## 2015-01-28 DIAGNOSIS — R05 Cough: Secondary | ICD-10-CM | POA: Diagnosis present

## 2015-01-28 LAB — BASIC METABOLIC PANEL
Anion gap: 7 (ref 5–15)
BUN: 8 mg/dL (ref 6–20)
CALCIUM: 9.3 mg/dL (ref 8.9–10.3)
CO2: 26 mmol/L (ref 22–32)
CREATININE: 0.9 mg/dL (ref 0.44–1.00)
Chloride: 104 mmol/L (ref 101–111)
GFR calc non Af Amer: >60 mL/min (ref >60)
Glucose, Bld: 94 mg/dL (ref 65–99)
Potassium: 3.1 mmol/L — ABNORMAL LOW (ref 3.5–5.1)
SODIUM: 137 mmol/L (ref 135–145)

## 2015-01-28 LAB — CBC
HCT: 37.5 % (ref 35.0–47.0)
Hemoglobin: 12.7 g/dL (ref 12.0–16.0)
MCH: 29.1 pg (ref 26.0–34.0)
MCHC: 33.8 g/dL (ref 32.0–36.0)
MCV: 86 fL (ref 80.0–100.0)
PLATELETS: 286 10*3/uL (ref 150–440)
RBC: 4.36 MIL/uL (ref 3.80–5.20)
RDW: 13.6 % (ref 11.5–14.5)
WBC: 6.2 10*3/uL (ref 3.6–11.0)

## 2015-01-28 LAB — INFLUENZA PANEL BY PCR (TYPE A & B)
H1N1 flu by pcr: NOT DETECTED
INFLBPCR: NEGATIVE
Influenza A By PCR: NEGATIVE

## 2015-01-28 LAB — FIBRIN DERIVATIVES D-DIMER (ARMC ONLY): Fibrin derivatives D-dimer (ARMC): 1836 — ABNORMAL HIGH (ref 0–499)

## 2015-01-28 LAB — TROPONIN I

## 2015-01-28 IMAGING — CR DG CHEST 2V
2 series · 2 of 2 positions shown · non-contrast
Comparison: Chest radiograph dated [DATE]

CLINICAL DATA: 34-year-old female with fever cough and chills x3
days

EXAM:
CHEST  2 VIEW

[chest pa]
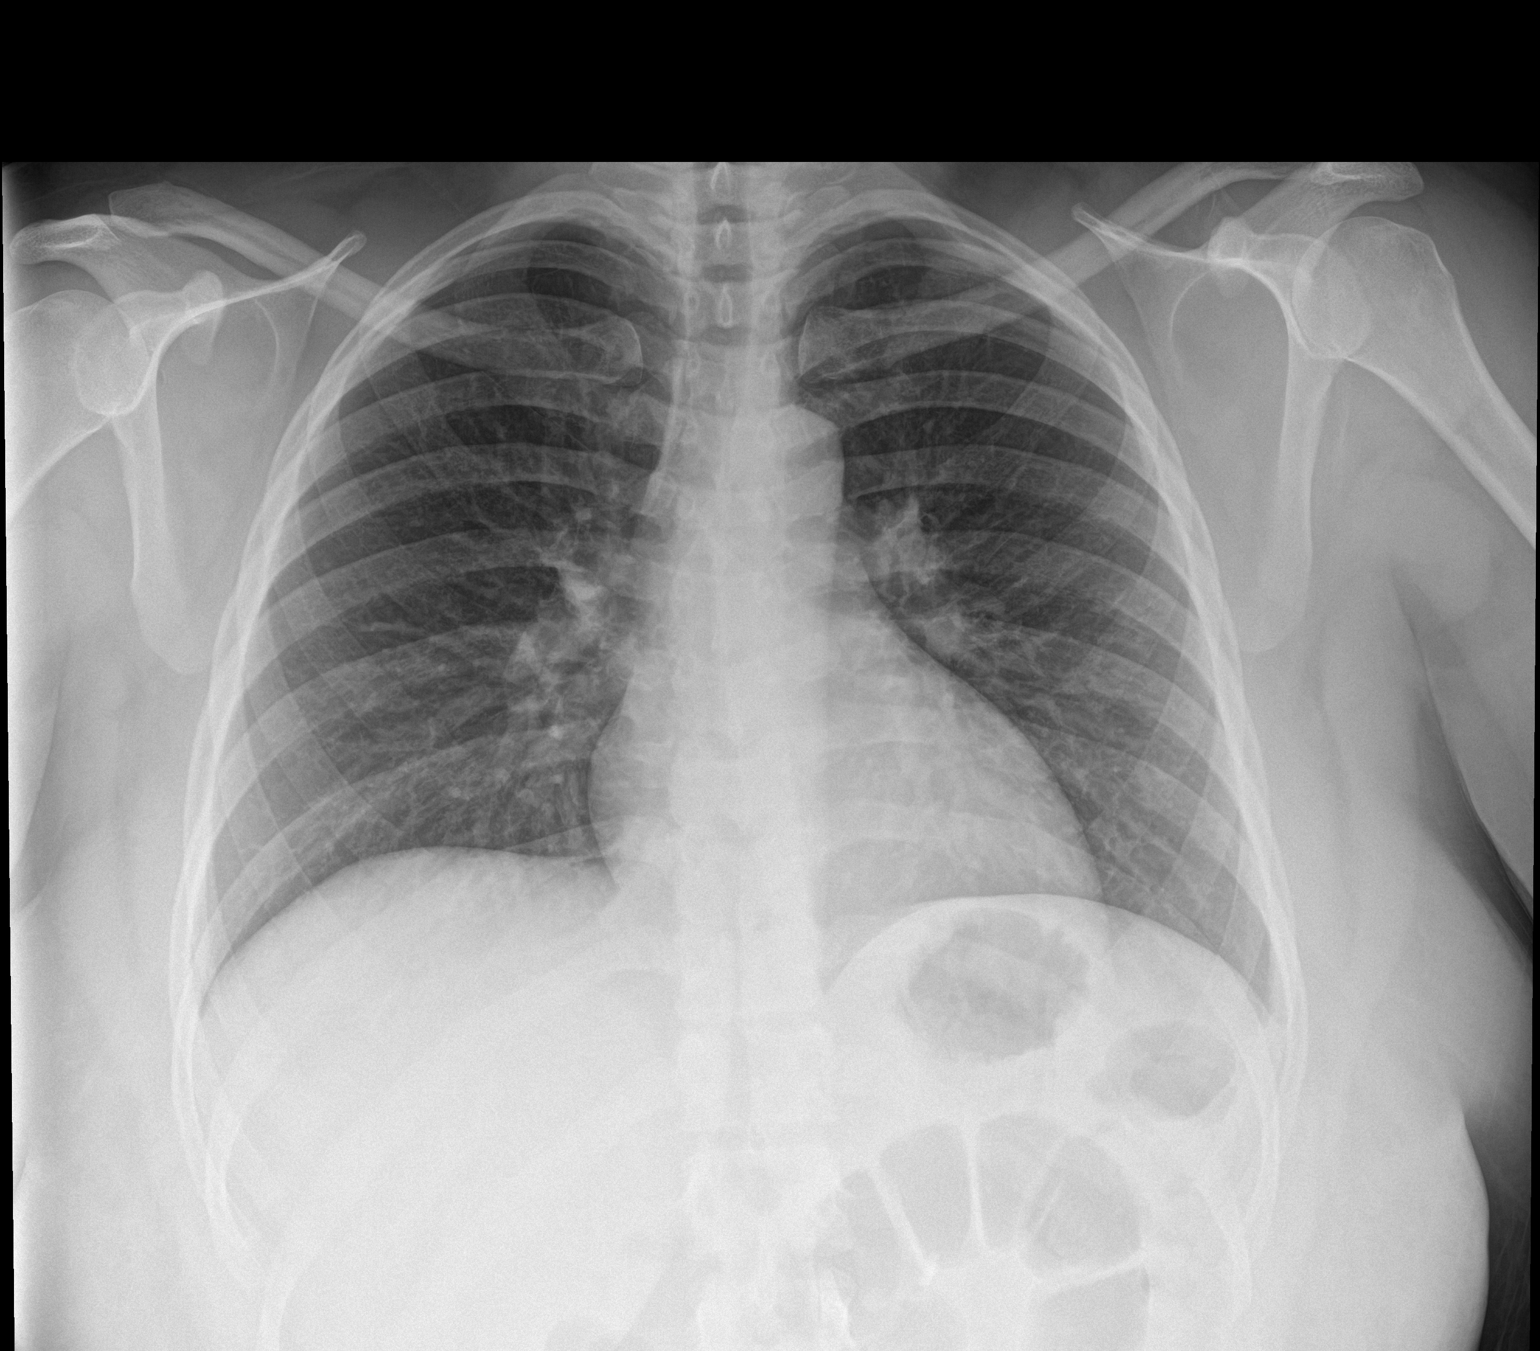

[chest lat]
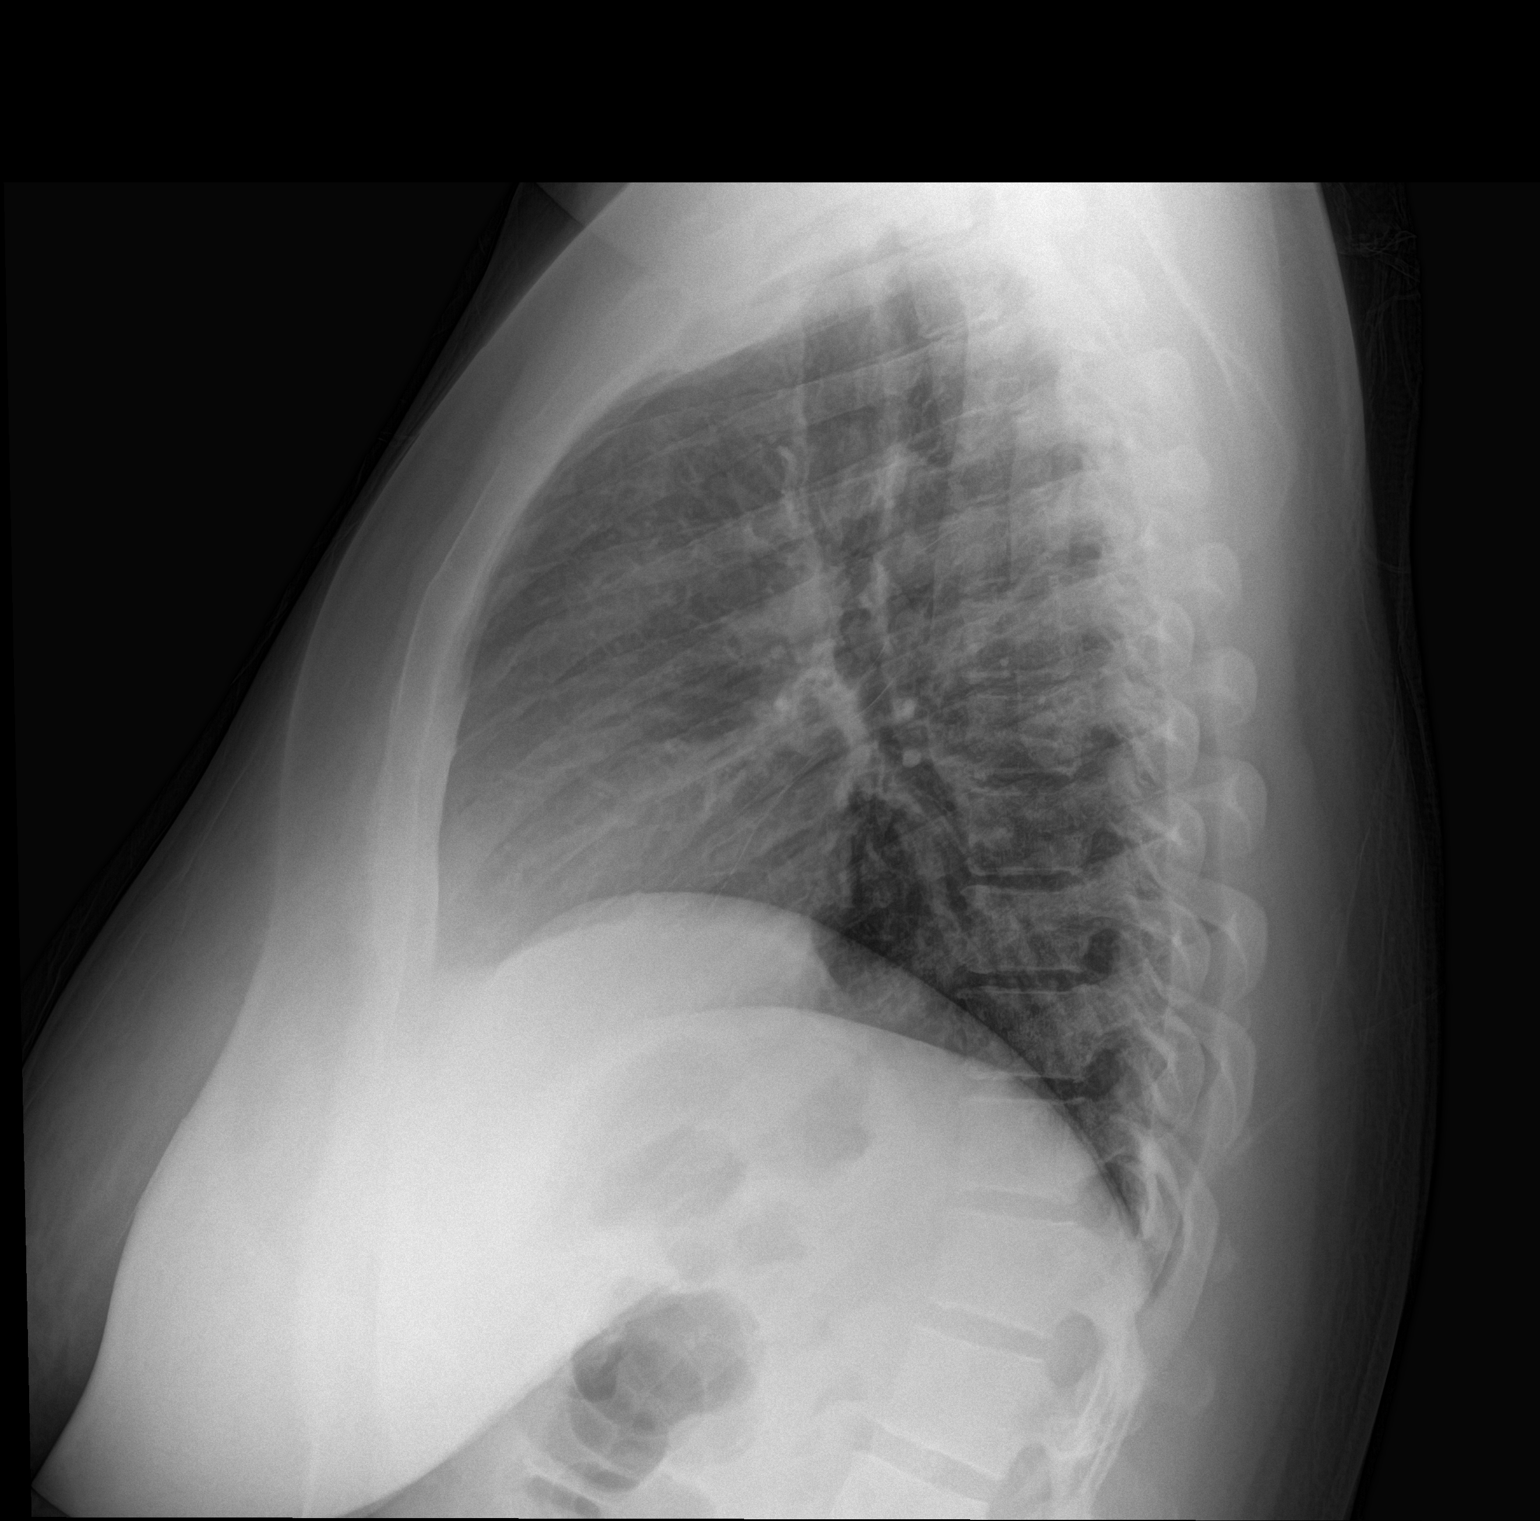

[2 of 2 positions shown; findings below may reference images not displayed]

FINDINGS: The heart size and mediastinal contours are within normal limits.
Both lungs are clear. The visualized skeletal structures are
unremarkable.
IMPRESSION: No active cardiopulmonary disease.

## 2015-01-28 MED ORDER — POTASSIUM CHLORIDE CRYS ER 20 MEQ PO TBCR
40.0000 meq | EXTENDED_RELEASE_TABLET | Freq: Once | ORAL | Status: AC
  Administered 2015-01-29: 40 meq via ORAL
  Filled 2015-01-28: qty 2

## 2015-01-28 MED ORDER — ALBUTEROL SULFATE HFA 108 (90 BASE) MCG/ACT IN AERS: 2.0000 | INHALATION_SPRAY | Freq: Four times a day (QID) | RESPIRATORY_TRACT | Status: DC | PRN

## 2015-01-28 MED ORDER — IPRATROPIUM-ALBUTEROL 0.5-2.5 (3) MG/3ML IN SOLN
3.0000 mL | Freq: Once | RESPIRATORY_TRACT | Status: AC
  Administered 2015-01-28: 3 mL via RESPIRATORY_TRACT
  Filled 2015-01-28 (×2): qty 3

## 2015-01-28 NOTE — ED Notes (Signed)
Patient transported to CT 
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
## 2015-01-28 NOTE — ED Notes (Signed)
Pt with 3 days of multiple complaints-fever, cough, congestion, shortness of breath, pain to the center of her chest, decreased appetite; talking in complete coherent sentences

## 2015-01-28 NOTE — Discharge Instructions (Signed)
You were evaluated for muscle aches, cough congestion, and found to have wheezing and treated with albuterol emergency department. You're being discharged with albuterol inhaler.  Return to the emergency department for any worsening condition including any trouble breathing, chest pain, dizziness or passing out, concern for dehydration such as not making urine, or any other symptoms concerning to you.   Bronchospasm, Adult A bronchospasm is a spasm or tightening of the airways going into the lungs. During a bronchospasm breathing becomes more difficult because the airways get smaller. When this happens there can be coughing, a whistling sound when breathing (wheezing), and difficulty breathing. Bronchospasm is often associated with asthma, but not all patients who experience a bronchospasm have asthma. CAUSES  A bronchospasm is caused by inflammation or irritation of the airways. The inflammation or irritation may be triggered by:   Allergies (such as to animals, pollen, food, or mold). Allergens that cause bronchospasm may cause wheezing immediately after exposure or many hours later.   Infection. Viral infections are believed to be the most common cause of bronchospasm.   Exercise.   Irritants (such as pollution, cigarette smoke, strong odors, aerosol sprays, and paint fumes).   Weather changes. Winds increase molds and pollens in the air. Rain refreshes the air by washing irritants out. Cold air may cause inflammation.   Stress and emotional upset.  SIGNS AND SYMPTOMS   Wheezing.   Excessive nighttime coughing.   Frequent or severe coughing with a simple cold.   Chest tightness.   Shortness of breath.  DIAGNOSIS  Bronchospasm is usually diagnosed through a history and physical exam. Tests, such as chest X-rays, are sometimes done to look for other conditions. TREATMENT   Inhaled medicines can be given to open up your airways and help you breathe. The medicines can be  given using either an inhaler or a nebulizer machine.  Corticosteroid medicines may be given for severe bronchospasm, usually when it is associated with asthma. HOME CARE INSTRUCTIONS   Always have a plan prepared for seeking medical care. Know when to call your health care provider and local emergency services (911 in the [place] Know where you can access local emergency care.  Only take medicines as directed by your health care provider.  If you were prescribed an inhaler or nebulizer machine, ask your health care provider to explain how to use it correctly. Always use a spacer with your inhaler if you were given one.  It is necessary to remain calm during an attack. Try to relax and breathe more slowly.  Control your home environment in the following ways:   Change your heating and air conditioning filter at least once a month.   Limit your use of fireplaces and wood stoves.  Do not smoke and do not allow smoking in your home.   Avoid exposure to perfumes and fragrances.   Get rid of pests (such as roaches and mice) and their droppings.   Throw away plants if you see mold on them.   Keep your house clean and dust free.   Replace carpet with wood, tile, or vinyl flooring. Carpet can trap dander and dust.   Use allergy-proof pillows, mattress covers, and box spring covers.   Wash bed sheets and blankets every week in hot water and dry them in a dryer.   Use blankets that are made of polyester or cotton.   Wash hands frequently. SEEK MEDICAL CARE IF:   You have muscle aches.   You have chest pain.  The sputum changes from clear or white to yellow, green, gray, or bloody.   The sputum you cough up gets thicker.   There are problems that may be related to the medicine you are given, such as a rash, itching, swelling, or trouble breathing.  SEEK IMMEDIATE MEDICAL CARE IF:   You have worsening wheezing and coughing even after taking your prescribed  medicines.   You have increased difficulty breathing.   You develop severe chest pain. MAKE SURE YOU:   Understand these instructions.  Will watch your condition.  Will get help right away if you are not doing well or get worse.   This information is not intended to replace advice given to you by your health care provider. Make sure you discuss any questions you have with your health care provider.   Document Released: 01/30/2003 Document Revised: 02/17/2014 Document Reviewed: 07/19/2012 Elsevier Interactive Patient Education 2016 Elsevier Inc.  Viral Infections A virus is a type of germ. Viruses can cause:  Minor sore throats.  Aches and pains.  Headaches.  Runny nose.  Rashes.  Watery eyes.  Tiredness.  Coughs.  Loss of appetite.  Feeling sick to your stomach (nausea).  Throwing up (vomiting).  Watery poop (diarrhea). HOME CARE   Only take medicines as told by your doctor.  Drink enough water and fluids to keep your pee (urine) clear or pale yellow. Sports drinks are a good choice.  Get plenty of rest and eat healthy. Soups and broths with crackers or rice are fine. GET HELP RIGHT AWAY IF:   You have a very bad headache.  You have shortness of breath.  You have chest pain or neck pain.  You have an unusual rash.  You cannot stop throwing up.  You have watery poop that does not stop.  You cannot keep fluids down.  You or your child has a temperature by mouth above 102 F (38.9 C), not controlled by medicine.  Your baby is older than 3 months with a rectal temperature of 102 F (38.9 C) or higher.  Your baby is 32 months old or younger with a rectal temperature of 100.4 F (38 C) or higher. MAKE SURE YOU:   Understand these instructions.  Will watch this condition.  Will get help right away if you are not doing well or get worse.   This information is not intended to replace advice given to you by your health care provider. Make  sure you discuss any questions you have with your health care provider.   Document Released: 01/10/2008 Document Revised: 04/21/2011 Document Reviewed: 07/05/2014 Elsevier Interactive Patient Education [employer].

## 2015-01-29 ENCOUNTER — Emergency Department: Payer: PRIVATE HEALTH INSURANCE

## 2015-01-29 IMAGING — CT CT ANGIO CHEST
1 of 2 series · 18 of 30 positions shown · IV contrast (APPLIED)
Comparison: None.

CLINICAL DATA: Upper respiratory congestion for 3 days with fever.
Tachycardia, shortness of breath, elevated D-dimer

EXAM:
CT ANGIOGRAPHY CHEST WITH CONTRAST
TECHNIQUE: Multidetector CT imaging of the chest was performed using the
standard protocol during bolus administration of intravenous
contrast. Multiplanar CT image reconstructions and MIPs were
obtained to evaluate the vascular anatomy.
CONTRAST:  100mL OMNIPAQUE IOHEXOL 350 MG/ML SOLN

[Series 5: pe 1.0 thins · axial · 0.65mm/px · z∈[-340,-142]mm · 18 of 225 slices shown]
[im 13/225  lung]
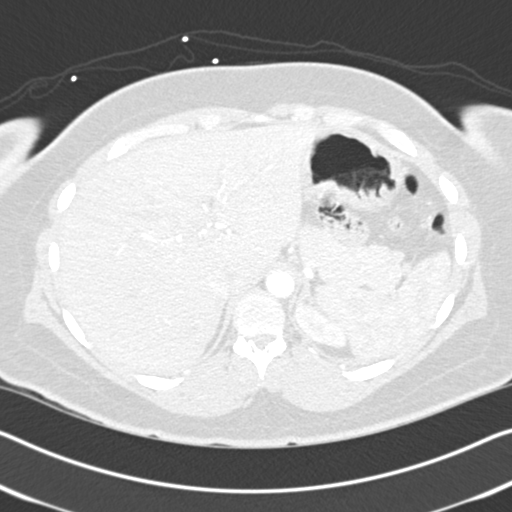
[im 25/225  mediastinal]
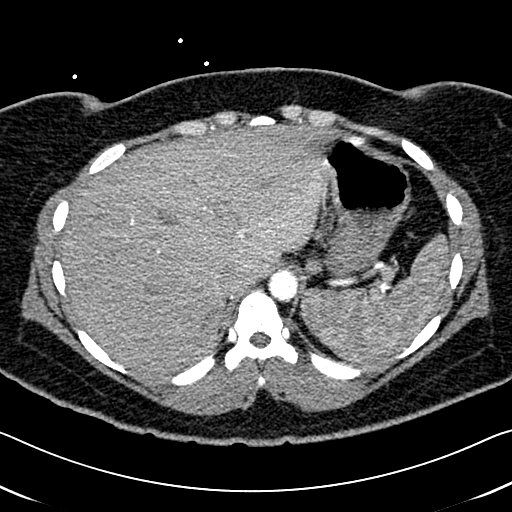
[im 38/225  lung]
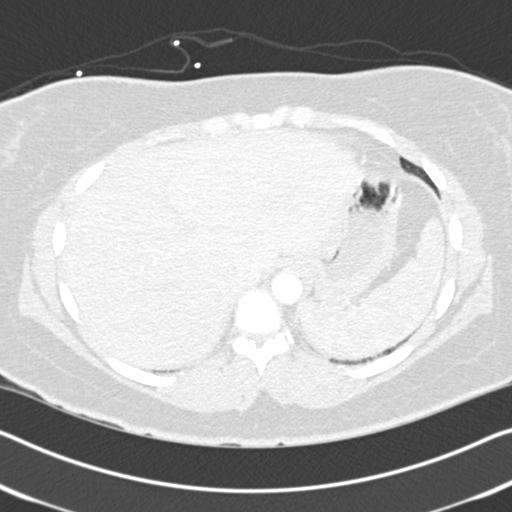
[im 50/225  mediastinal]
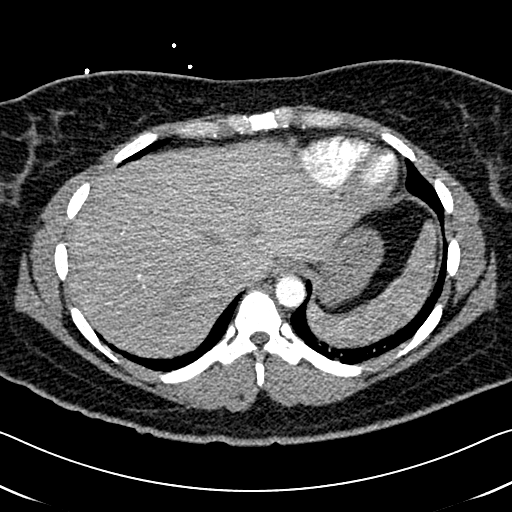
[im 63/225  lung]
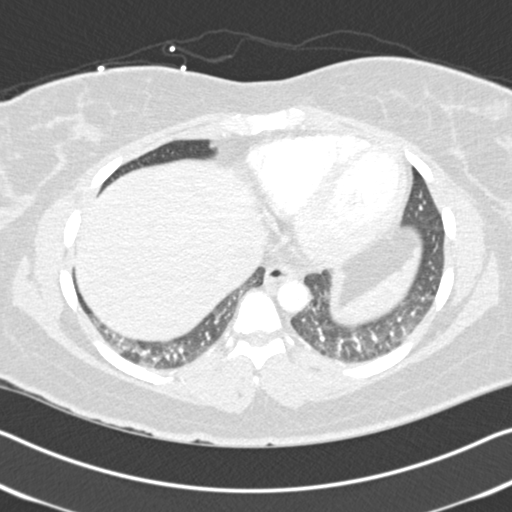
[im 75/225  mediastinal]
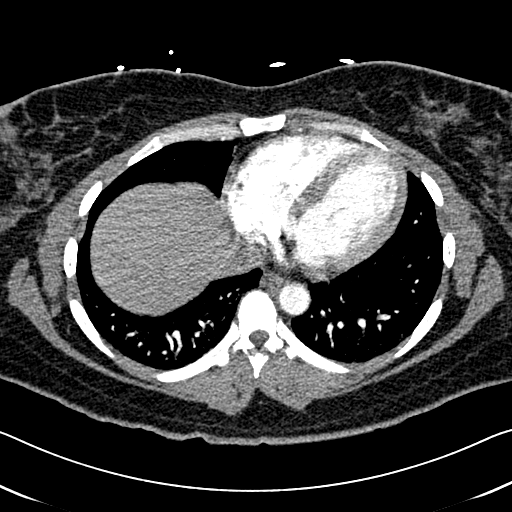
[im 88/225  lung]
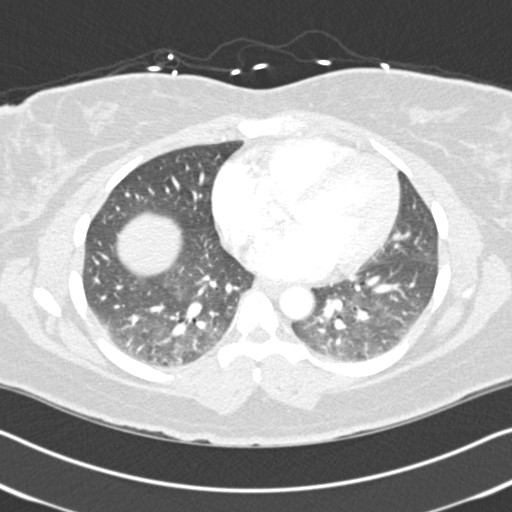
[im 100/225  mediastinal]
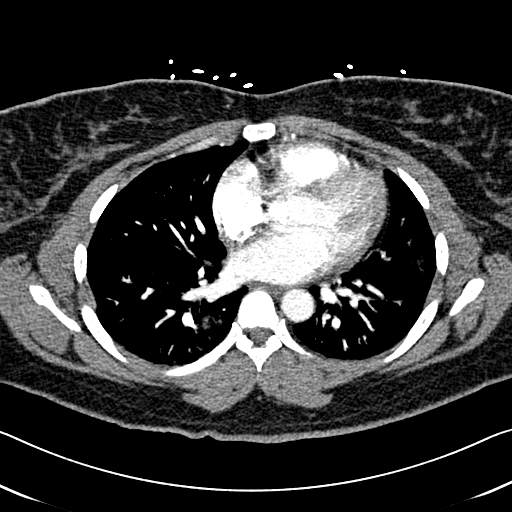
[im 103/225  lung]
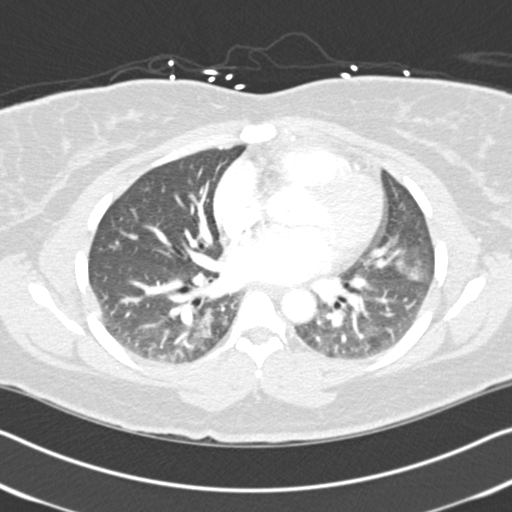
[im 113/225  mediastinal]
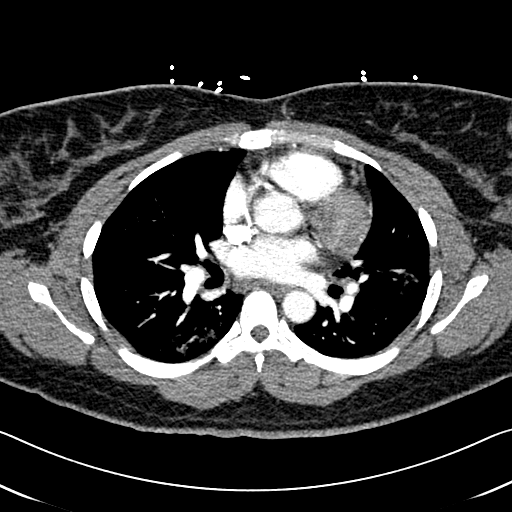
[im 125/225  lung]
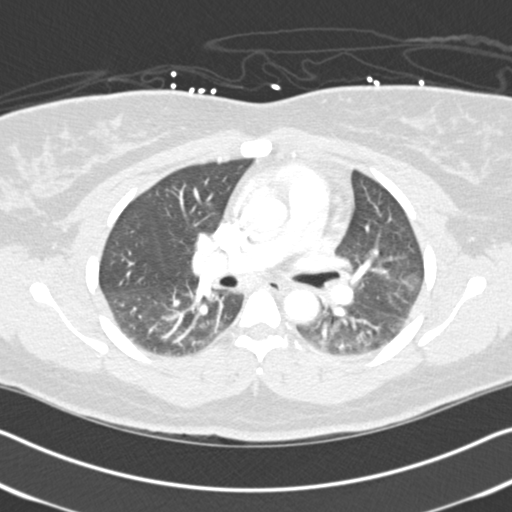
[im 137/225  mediastinal]
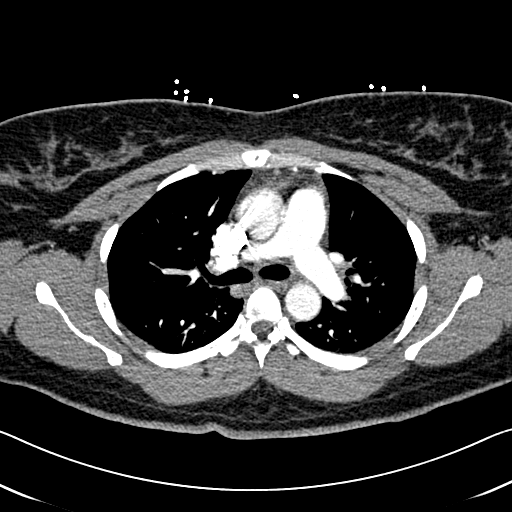
[im 150/225  lung]
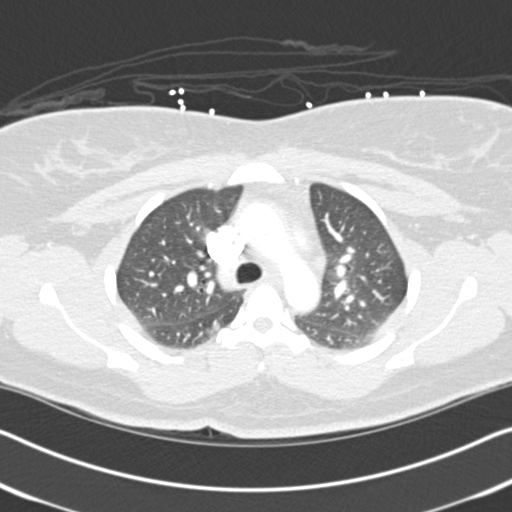
[im 162/225  mediastinal]
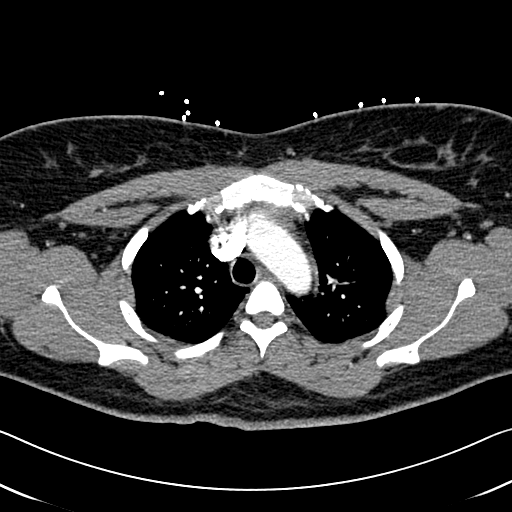
[im 175/225  lung]
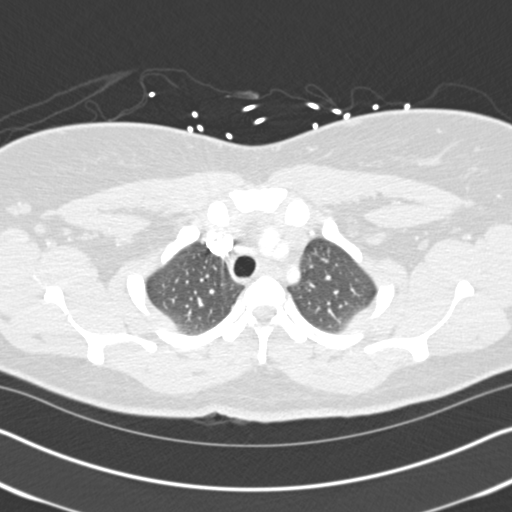
[im 187/225  mediastinal]
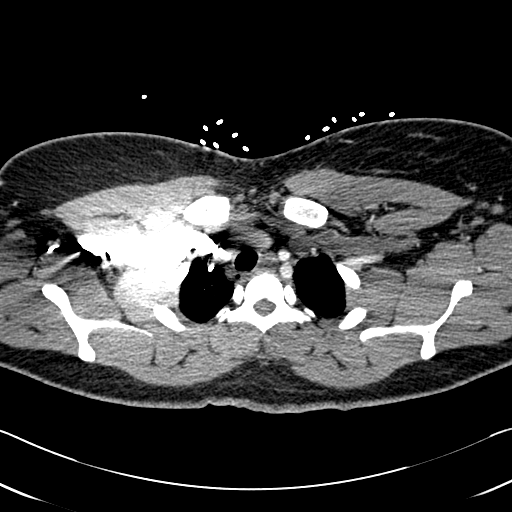
[im 200/225  lung]
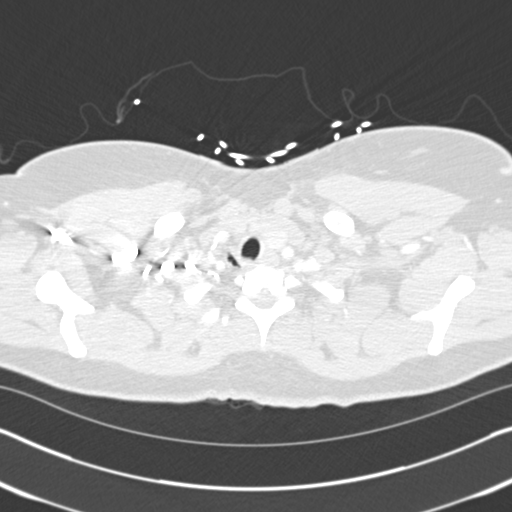
[im 212/225  mediastinal]
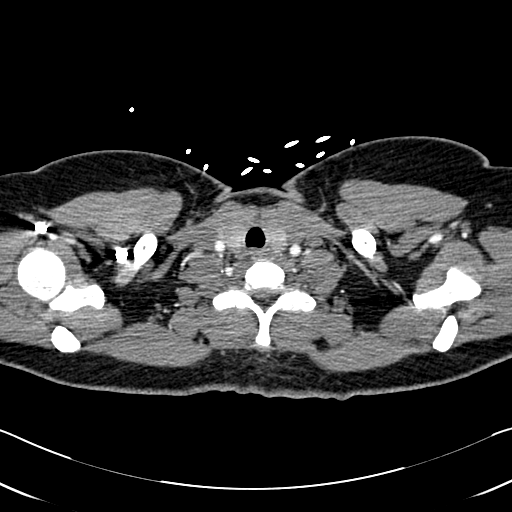

[18 of 30 positions shown; findings below may reference images not displayed]

FINDINGS: THORACIC INLET/BODY WALL:

Incidental tracheal gas-filled diverticulum on the right at the
thoracic inlet.

MEDIASTINUM:

Normal heart size. No pericardial effusion. CTA of the pulmonary
arteries is limited by intermittent respiratory motion and patient
size. No evidence of pulmonary embolism.

LUNG WINDOWS:

Patchy consolidative opacities in the lingula and apical segment
right lower lobe. No cavitation, pleural effusion, or air leak.

UPPER ABDOMEN:

No acute findings.

OSSEOUS:

No acute fracture.  No suspicious lytic or blastic lesions.

Review of the MIP images confirms the above findings.
IMPRESSION: 1. Bilateral pneumonia.
2. No evidence of pulmonary embolism.

## 2015-01-29 MED ORDER — AZITHROMYCIN 250 MG PO TABS: ORAL_TABLET | ORAL | Status: DC

## 2015-01-29 MED ORDER — IOHEXOL 350 MG/ML SOLN
75.0000 mL | Freq: Once | INTRAVENOUS | Status: AC | PRN
  Administered 2015-01-29: 100 mL via INTRAVENOUS

## 2015-01-29 NOTE — ED Provider Notes (Signed)
-----------------------------------------   1:46 AM on 01/29/2015 -----------------------------------------  CT angiogram negative for PE. Does reveal mild bilateral pneumonia. We'll start on azithromycin. Remains hemodynamically stable, vital signs are normal, patient is comfortable and not in distress. Follow-up with primary care in a week.  Carrie Mew, MD 01/29/15 [phone]

## 2015-01-31 LAB — POC HEMOCCULT BLD/STL (HOME/3-CARD/SCREEN)
Card #2 Fecal Occult Blod, POC: NEGATIVE
FECAL OCCULT BLD: NEGATIVE
FECAL OCCULT BLD: NEGATIVE

## 2015-02-01 ENCOUNTER — Encounter: Payer: Self-pay | Admitting: Physician Assistant

## 2015-02-01 ENCOUNTER — Ambulatory Visit: Payer: Self-pay | Admitting: Physician Assistant

## 2015-02-01 VITALS — BP 120/82 | HR 80 | Temp 98.1°F

## 2015-02-01 DIAGNOSIS — J189 Pneumonia, unspecified organism: Secondary | ICD-10-CM

## 2015-02-01 MED ORDER — FLUCONAZOLE 150 MG PO TABS: 150.0000 mg | ORAL_TABLET | Freq: Once | ORAL | Status: DC

## 2015-02-01 MED ORDER — LEVOFLOXACIN 500 MG PO TABS: 500.0000 mg | ORAL_TABLET | Freq: Every day | ORAL | Status: DC

## 2015-02-01 MED ORDER — HYDROCOD POLST-CPM POLST ER 10-8 MG/5ML PO SUER: 5.0000 mL | Freq: Two times a day (BID) | ORAL | Status: DC | PRN

## 2015-02-01 NOTE — Progress Notes (Signed)
S: here for recheck, was seen in ER Sunday , dx with b/l pneumonia, still feels weak and having fever/chills, cough; cough keeping her awake, ? If she can go back to work yet, denies cp/sob  O: vitals wnl, nad, pt appears tired, lungs c t a, cv rrr  A: cap  P: levaquin 500mg  qd x 10d, d/c zpack, tussionex qhs, diflucan if needed, recommend pt not return to work until after 12/26 as works in the [employer]

## 2015-02-06 ENCOUNTER — Encounter: Payer: Self-pay | Admitting: Physical Therapy

## 2015-02-06 ENCOUNTER — Ambulatory Visit: Payer: PRIVATE HEALTH INSURANCE | Admitting: Physical Therapy

## 2015-02-06 DIAGNOSIS — M2560 Stiffness of unspecified joint, not elsewhere classified: Secondary | ICD-10-CM

## 2015-02-06 DIAGNOSIS — M546 Pain in thoracic spine: Secondary | ICD-10-CM
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
## 2015-02-15 ENCOUNTER — Other Ambulatory Visit: Payer: Self-pay

## 2015-02-15 ENCOUNTER — Encounter: Payer: Self-pay | Admitting: Family Medicine

## 2015-02-15 ENCOUNTER — Ambulatory Visit (INDEPENDENT_AMBULATORY_CARE_PROVIDER_SITE_OTHER): Payer: Managed Care, Other (non HMO) | Admitting: Family Medicine

## 2015-02-15 VITALS — BP 118/88 | HR 72 | Temp 98.1°F | Resp 16 | Wt 190.5 lb

## 2015-02-15 DIAGNOSIS — N62 Hypertrophy of breast: Secondary | ICD-10-CM | POA: Diagnosis not present

## 2015-02-15 DIAGNOSIS — E559 Vitamin D deficiency, unspecified: Secondary | ICD-10-CM

## 2015-02-15 DIAGNOSIS — N920 Excessive and frequent menstruation with regular cycle: Secondary | ICD-10-CM | POA: Diagnosis not present

## 2015-02-15 DIAGNOSIS — M546 Pain in thoracic spine: Secondary | ICD-10-CM

## 2015-02-15 DIAGNOSIS — J189 Pneumonia, unspecified organism: Secondary | ICD-10-CM | POA: Diagnosis not present

## 2015-02-15 DIAGNOSIS — D509 Iron deficiency anemia, unspecified: Secondary | ICD-10-CM

## 2015-02-15 DIAGNOSIS — E876 Hypokalemia: Secondary | ICD-10-CM

## 2015-02-15 NOTE — Progress Notes (Signed)
Patient came in to have blood drawn per her physician, Dr. Ancil Boozer at [facility].  Blood was drawn from the right arm without any incident.

## 2015-02-15 NOTE — Progress Notes (Signed)
Name: Anita Newton   MRN: [id number]    DOB: 06-10-1980   Date:02/15/2015       Progress Note  Subjective  Chief Complaint  Chief Complaint  Patient presents with  . Follow-up    patient is here for a 64-monthf/u. patient has completed physical therapy. patient states that she is better. she has not had her labs done due to a recent episode fo pneumonia.    HPI  Vitamin D deficiency: level was 178 she is due for repeat test. She states since she started supplementation she still feels tired  Anemia: hgb was 11, but improved - [facility],  She still  has fatigue. She is taking ferrous sulfate as recommended  Menorrhagia: she had tubal ligation in 2003 and stopped using contraception. The following year she developed heavy cycles, with clots for 5 days and light for a couple more days. She was re-started on ocp two months ago but cycles are still heavy, she will try a couple more months before changing therapy   Large Breast: having back pain,finished PT, pain is described as a constant pain on mid back, sometimes aching, other times sharp, worse over trapezium muscle. She is ready to see a surgeon, considering breast reduction surgery.   [facility], SOB, chills, fatigue. She took levaquin and is feeling better now. D-dimer was positive but CT scan chest showed bilateral pneumonia.   Patient Active Problem List   Diagnosis Date Noted  . Adiposity 12/21/2014  . Anxiety and depression 12/21/2014  . History of eclampsia 12/15/2014  . Hypertension, benign 12/15/2014  . Large breasts 12/15/2014  . Obesity (BMI 35.0-39.9 without comorbidity) ([place] 12/15/2014  . Thoracic back pain 12/15/2014  . Vitamin D deficiency 12/15/2014    Past Surgical History  Procedure Laterality Date  . Tubal ligation    . Eye surgery      Family History  Problem Relation Age of Onset  . Cancer Mother     eye tumor  . Diabetes  Mother   . Lupus Mother   . Alcohol abuse Father   . Drug abuse Father   . Eczema Daughter   . ADD / ADHD Daughter   . ADD / ADHD Son   . Allergic Disorder Son   . Lupus Sister   . Depression Sister   . Hypertension Brother     Social History   Social History  . Marital Status: Single    Spouse Name: N/A  . Number of Children: N/A  . Years of Education: N/A   Occupational History  . Not on file.   Social History Main Topics  . Smoking status: Never Smoker   . Smokeless tobacco: Never Used  . Alcohol Use: No  . Drug Use: No  . Sexual Activity: Not Currently   Other Topics Concern  . Not on file   Social History Narrative     Current outpatient prescriptions:  .  albuterol (PROVENTIL HFA;VENTOLIN HFA) 108 (90 BASE) MCG/ACT inhaler, Inhale 2 puffs into the lungs every 6 (six) hours as needed for wheezing or shortness of breath., Disp: 1 Inhaler, Rfl: 0 .  ferrous sulfate 325 (65 FE) MG tablet, Take 1 tablet (325 mg total) by mouth 2 (two) times daily with a meal., Disp: 60 tablet, Rfl: 2 .  ibuprofen (ADVIL,MOTRIN) 600 MG tablet, Take by mouth., Disp: , Rfl:  .  Multiple Vitamin (  MULTIVITAMIN) tablet, Take 1 tablet by mouth daily., Disp: , Rfl:  .  norgestimate-ethinyl estradiol (ORTHO-CYCLEN, 28,) 0.25-35 MG-MCG tablet, Take 1 tablet by mouth daily., Disp: 1 Package, Rfl: 11 .  vitamin C (ASCORBIC ACID) 500 MG tablet, Take 500 mg by mouth daily. Reported on 02/01/2015, Disp: , Rfl:  .  Vitamin D, Ergocalciferol, (DRISDOL) 50000 UNITS CAPS capsule, Take 1 capsule (50,000 Units total) by mouth every 7 (seven) days., Disp: 12 capsule, Rfl: 0  Allergies  Allergen Reactions  . Cherry Hives and Swelling     ROS  Constitutional: Negative for fever , positive for  weight change - gain.  Respiratory: Negative for cough and shortness of breath.   Cardiovascular: Negative for chest pain or palpitations.  Gastrointestinal: Negative for abdominal pain, no bowel changes.   Musculoskeletal: Negative for gait problem or joint swelling.  Skin: Negative for rash.  Neurological: Negative for dizziness or headache.  No other specific complaints in a complete review of systems (except as listed in HPI above).  Objective  Filed Vitals:   02/15/15 0912  BP: 118/88  Pulse: 72  Temp: 98.1 F (36.7 C)  TempSrc: Oral  Resp: 16  Weight: 190 lb 8 oz (86.41 kg)  SpO2: 97%    Body mass index is 37.2 kg/(m^2).  Physical Exam  Constitutional: Patient appears well-developed and well-nourished. Obese  No distress.  HEENT: head atraumatic, normocephalic, pupils equal and reactive to light,neck supple, throat within normal limits Cardiovascular: Normal rate, regular rhythm and normal heart sounds.  No murmur heard. No BLE edema. Pulmonary/Chest: Effort normal and breath sounds normal. No respiratory distress. Abdominal: Soft.  There is no tenderness. Psychiatric: Patient has a normal mood and affect. behavior is normal. Judgment and thought content normal. Muscular Skeletal: tender during palpation of trapezium muscle  Recent Results (from the past 2160 hour(s))  Pap IG, CT/NG NAA, and HPV (high risk)     Status: None   Collection Time: 12/18/14 12:00 AM  Result Value Ref Range   DIAGNOSIS: Comment     Comment: NEGATIVE FOR INTRAEPITHELIAL LESION AND MALIGNANCY.   Specimen adequacy: Comment     Comment: Satisfactory for evaluation. Endocervical and/or squamous metaplastic cells (endocervical component) are present.    CLINICIAN PROVIDED ICD10: Comment     Comment: Z12.4   Performed by: Comment     Comment: Windell Norfolk, Cytotechnologist (ASCP)   PAP SMEAR COMMENT .    Note: Comment     Comment: The Pap smear is a screening test designed to aid in the detection of premalignant and malignant conditions of the uterine cervix.  It is not a diagnostic procedure and should not be used as the sole means of detecting cervical cancer.  Both false-positive and  false-negative reports do occur.    Test Methodology Comment     Comment: This liquid based ThinPrep(R) pap test was screened with the use of an image guided system.    HPV, high-risk Negative Negative    Comment: This high-risk HPV test detects thirteen high-risk types (16/18/31/33/35/39/45/51/52/56/58/59/68) without differentiation.   Lipid panel     Status: None   Collection Time: 12/20/14 12:00 AM  Result Value Ref Range   Triglycerides 63 40 - 160 mg/dL   Cholesterol 168 0 - 200 mg/dL   HDL 55 35 - 70 mg/dL   LDL Cholesterol 100 mg/dL  Executive Panel     Status: Abnormal   Collection Time: 12/20/14  8:15 AM  Result Value Ref Range  Glucose 85 65 - 99 mg/dL   Uric Acid 5.6 2.5 - 7.1 mg/dL    Comment:            Therapeutic target for gout patients: <6.0   BUN 12 6 - 20 mg/dL   Creatinine, Ser 0.72 0.57 - 1.00 mg/dL   GFR calc non Af Amer 109 >59 mL/min/1.73   GFR calc Af Amer 125 >59 mL/min/1.73   BUN/Creatinine Ratio 17 8 - 20   Sodium 136 136 - 144 mmol/L   Potassium 4.4 3.5 - 5.2 mmol/L   Chloride 99 97 - 106 mmol/L   Calcium 9.2 8.7 - 10.2 mg/dL   Phosphorus 3.5 2.5 - 4.5 mg/dL   Total Protein 7.2 6.0 - 8.5 g/dL   Albumin 4.2 3.5 - 5.5 g/dL   Globulin, Total 3.0 1.5 - 4.5 g/dL   Albumin/Globulin Ratio 1.4 1.1 - 2.5   Bilirubin Total 0.3 0.0 - 1.2 mg/dL   Alkaline Phosphatase 73 39 - 117 IU/L   LDH 166 119 - 226 IU/L   AST 21 0 - 40 IU/L   ALT 24 0 - 32 IU/L   GGT 30 0 - 60 IU/L   Iron 62 27 - 159 ug/dL   Cholesterol, Total 168 100 - 199 mg/dL   Triglycerides 63 0 - 149 mg/dL   HDL 55 >39 mg/dL    Comment: According to ATP-III Guidelines, HDL-C >59 mg/dL is considered a negative risk factor for CHD.    VLDL Cholesterol Cal 13 5 - 40 mg/dL   LDL Calculated 100 (H) 0 - 99 mg/dL   Chol/HDL Ratio 3.1 0.0 - 4.4 ratio units    Comment:                                   T. Chol/HDL Ratio                                             Men  Women                                1/2 Avg.Risk  3.4    3.3                                   Avg.Risk  5.0    4.4                                2X Avg.Risk  9.6    7.1                                3X Avg.Risk 23.4   11.0    Estimated CHD Risk  < 0.5 0.0 - 1.0  times avg.    Comment:                                   T. Chol/HDL Ratio  Men  Women                               1/2 Avg.Risk  3.4    3.3                                   Avg.Risk  5.0    4.4                                2X Avg.Risk  9.6    7.1                                3X Avg.Risk 23.4   11.0 The CHD Risk is based on the T. Chol/HDL ratio.  Other factors affect CHD Risk such as hypertension, smoking, diabetes, severe obesity, and family history of pre- mature CHD.    TSH 2.200 0.450 - 4.500 uIU/mL   T4, Total 10.1 4.5 - 12.0 ug/dL   T3 Uptake Ratio 26 24 - 39 %   Free Thyroxine Index 2.6 1.2 - 4.9   WBC 6.5 3.4 - 10.8 x10E3/uL   RBC 3.91 3.77 - 5.28 x10E6/uL   Hemoglobin 11.0 (L) 11.1 - 15.9 g/dL   Hematocrit 32.7 (L) 34.0 - 46.6 %   MCV 84 79 - 97 fL   MCH 28.1 26.6 - 33.0 pg   MCHC 33.6 31.5 - 35.7 g/dL   RDW 13.8 12.3 - 15.4 %   Platelets 365 150 - 379 x10E3/uL   Neutrophils 50 %   Lymphs 39 %   Monocytes 8 %   Eos 2 %   Basos 1 %   Neutrophils Absolute 3.3 1.4 - 7.0 x10E3/uL   Lymphocytes Absolute 2.5 0.7 - 3.1 x10E3/uL   Monocytes Absolute 0.5 0.1 - 0.9 x10E3/uL   EOS (ABSOLUTE) 0.2 0.0 - 0.4 x10E3/uL   Basophils Absolute 0.0 0.0 - 0.2 x10E3/uL   Immature Granulocytes 0 %   Immature Grans (Abs) 0.0 0.0 - 0.1 x10E3/uL  Vitamin D (25 hydroxy)     Status: Abnormal   Collection Time: 12/20/14  8:15 AM  Result Value Ref Range   Vit D, 25-Hydroxy 16.1 (L) 30.0 - 100.0 ng/mL    Comment: Vitamin D deficiency has been defined by the Institute of Medicine and an Endocrine Society practice guideline as a level of serum 25-OH vitamin D less than 20 ng/mL (1,2). The Endocrine Society  went on to further define vitamin D insufficiency as a level between 21 and 29 ng/mL (2). 1. IOM (Institute of Medicine). 2010. Dietary reference    intakes for calcium and D. [place]: The    [employer]. 2. Holick MF, Binkley [place], Bischoff-Ferrari HA, et al.    Evaluation, treatment, and prevention of vitamin D    deficiency: an Endocrine Society clinical practice    guideline. JCEM. 2011 Jul; 96(7):1911-30.   RPR     Status: None   Collection Time: 12/20/14  8:15 AM  Result Value Ref Range   RPR Ser Ql Non Reactive Non Reactive  HIV antibody     Status: None   Collection Time: 12/20/14  8:15 AM  Result Value Ref Range   HIV Screen 4th Generation wRfx Non Reactive Non Reactive  Fibrin derivatives D-Dimer ([facility] only)     Status: Abnormal   Collection Time: 01/27/15 10:00 PM  Result Value Ref Range   Fibrin derivatives D-dimer ([facility]) 1836 (H) 0 - 499    Comment: <> Exclusion of Venous Thromboembolism (VTE) - OUTPATIENTS ONLY        (Emergency Department or [place])             0-499 ng/ml (FEU)  : With a low to intermediate pretest                                        probability for VTE this test result                                        excludes the diagnosis of VTE.           > 499 ng/ml (FEU)  : VTE not excluded.  Additional work up                                   for VTE is required.   <>  Testing on Inpatients and Evaluation of Disseminated Intravascular        Coagulation (DIC)             Reference Range:   0-499 ng/ml (FEU)   Basic metabolic panel     Status: Abnormal   Collection Time: 01/28/15  8:21 PM  Result Value Ref Range   Sodium 137 135 - 145 mmol/L   Potassium 3.1 (L) 3.5 - 5.1 mmol/L   Chloride 104 101 - 111 mmol/L   CO2 26 22 - 32 mmol/L   Glucose, Bld 94 65 - 99 mg/dL   BUN 8 6 - 20 mg/dL   Creatinine, Ser 0.90 0.44 - 1.00 mg/dL   Calcium 9.3 8.9 - 10.3 mg/dL   GFR calc non Af Amer >60 >60 mL/min   GFR calc Af Amer >60 >60  mL/min    Comment: (NOTE) The eGFR has been calculated using the CKD EPI equation. This calculation has not been validated in all clinical situations. eGFR's persistently <60 mL/min signify possible Chronic Kidney Disease.    Anion gap 7 5 - 15  CBC     Status: None   Collection Time: 01/28/15  8:21 PM  Result Value Ref Range   WBC 6.2 3.6 - 11.0 K/uL   RBC 4.36 3.80 - 5.20 MIL/uL   Hemoglobin 12.7 12.0 - 16.0 g/dL   HCT 37.5 35.0 - 47.0 %   MCV 86.0 80.0 - 100.0 fL   MCH 29.1 26.0 - 34.0 pg   MCHC 33.8 32.0 - 36.0 g/dL   RDW 13.6 11.5 - 14.5 %   Platelets 286 150 - 440 K/uL  Troponin I     Status: None   Collection Time: 01/28/15  8:21 PM  Result Value Ref Range   Troponin I <0.03 <0.031 ng/mL    Comment:        NO INDICATION OF MYOCARDIAL INJURY.   Influenza panel by PCR (type A & B, H1N1)     Status: None   Collection Time: 01/28/15 10:05 PM  Result Value Ref Range   Influenza A By PCR NEGATIVE NEGATIVE  Influenza B By PCR NEGATIVE NEGATIVE   H1N1 flu by pcr NOT DETECTED NOT DETECTED    Comment:        The Xpert Flu assay (FDA approved for nasal aspirates or washes and nasopharyngeal swab specimens), is intended as an aid in the diagnosis of influenza and should not be used as a sole basis for treatment.   POC Hemoccult Bld/Stl (3-Cd Home Screen)     Status: Normal   Collection Time: 01/31/15  8:25 AM  Result Value Ref Range   Card #1 Date 01/29/15    Fecal Occult Blood, POC Negative Negative   Card #2 Date 01/29/15    Card #2 Fecal Occult Blod, POC Negative    Card #3 Date 01/29/15    Card #3 Fecal Occult Blood, POC Negative      PHQ2/9: Depression screen [facility] 2/9 02/15/2015 12/15/2014  Decreased Interest 0 0  Down, Depressed, Hopeless 0 0  PHQ - 2 Score 0 0    Fall Risk: Fall Risk  02/15/2015 12/15/2014  Falls in the past year? No No    Functional Status Survey: Is the patient deaf or have difficulty hearing?: No Does the patient have difficulty  seeing, even when wearing glasses/contacts?: No Does the patient have difficulty concentrating, remembering, or making decisions?: No Does the patient have difficulty walking or climbing stairs?: Yes (due to back issues) Does the patient have difficulty dressing or bathing?: No Does the patient have difficulty doing errands alone such as visiting a doctor's office or shopping?: No    Assessment & Plan  1. Bilateral thoracic back pain  Still present secondary to large breast, referring her for possible breast reduction surgery   2. Large breasts  - Consult to plastic surgery  3. Iron deficiency anemia  - Ferritin  4. Menorrhagia with regular cycle  Continue ocp  5. CAP (community acquired pneumonia)  Treated with levaquin 01/28/2015, doing well now, no cough, no wheezing , she has occasional SOB but is chronic  6. Hypokalemia  - Potassium  7. Vitamin D deficiency  - VITAMIN D 25 Hydroxy (Vit-D Deficiency, Fractures)

## 2015-02-16 LAB — SPECIMEN STATUS REPORT

## 2015-02-16 LAB — VITAMIN D 25 HYDROXY (VIT D DEFICIENCY, FRACTURES): Vit D, 25-Hydroxy: 32.6 ng/mL (ref 30.0–100.0)

## 2015-02-16 LAB — POTASSIUM: Potassium: 4.4 mmol/L (ref 3.5–5.2)

## 2015-02-16 LAB — FERRITIN: FERRITIN: 55 ng/mL (ref 15–150)

## 2015-02-22 NOTE — Progress Notes (Signed)
Lab results were faxed to Dr. Ancil Boozer at [facility] per patient's request.

## 2015-03-02 ENCOUNTER — Encounter: Payer: Self-pay | Admitting: Family Medicine

## 2015-05-16 ENCOUNTER — Ambulatory Visit (INDEPENDENT_AMBULATORY_CARE_PROVIDER_SITE_OTHER): Payer: Managed Care, Other (non HMO) | Admitting: Family Medicine

## 2015-05-16 ENCOUNTER — Encounter: Payer: Self-pay | Admitting: Family Medicine

## 2015-05-16 VITALS — BP 124/88 | HR 79 | Temp 98.0°F | Resp 16 | Ht 60.0 in | Wt 186.1 lb

## 2015-05-16 DIAGNOSIS — Z8639 Personal history of other endocrine, nutritional and metabolic disease: Secondary | ICD-10-CM

## 2015-05-16 DIAGNOSIS — I10 Essential (primary) hypertension: Secondary | ICD-10-CM

## 2015-05-16 DIAGNOSIS — J302 Other seasonal allergic rhinitis: Secondary | ICD-10-CM | POA: Diagnosis not present

## 2015-05-16 DIAGNOSIS — N62 Hypertrophy of breast: Secondary | ICD-10-CM

## 2015-05-16 DIAGNOSIS — M546 Pain in thoracic spine: Secondary | ICD-10-CM

## 2015-05-16 DIAGNOSIS — E559 Vitamin D deficiency, unspecified: Secondary | ICD-10-CM

## 2015-05-16 DIAGNOSIS — E669 Obesity, unspecified: Secondary | ICD-10-CM

## 2015-05-16 DIAGNOSIS — R062 Wheezing: Secondary | ICD-10-CM | POA: Diagnosis not present

## 2015-05-16 MED ORDER — VITAMIN D 50 MCG (2000 UT) PO CAPS: 1.0000 | ORAL_CAPSULE | Freq: Every day | ORAL | Status: DC

## 2015-05-16 MED ORDER — CETIRIZINE HCL 10 MG PO TABS: 10.0000 mg | ORAL_TABLET | Freq: Every day | ORAL | Status: DC

## 2015-05-16 NOTE — Progress Notes (Signed)
Name: Anita Newton   MRN: [id number]    DOB: November 25, 1980   Date:05/16/2015       Progress Note  Subjective  Chief Complaint  Chief Complaint  Patient presents with  . Bilateral Thoracic Back Pain    Unchanged, has done the physical therapy with no relief. Gave patient stretch bands and exercises to do at home, having knots in the back of her neck from over exerting her self during strength workouts.  . Iron Deficiency Anemia    Taking iron supplements along with Fish Oil and B12. Patient states she is still fatigue and sluggish.   . Menorrhagia    with regular cycle, still passing big clots and having heavy cycles. Start birth control and has not had a period since beginning of March 2017  . Hypokalemia  . Vitamin D Deficiency  . Breast Pain    pain prsist even after taking physical therapy. order was put in on 02/15/15, but the referral was never made. new referral was placed today    HPI   Vitamin D deficiency: level was 16, last labs were at goal, taking otc vitamin D, still feels a little tired.   Anemia: hgb was 11, but back to normal on her last labs. She still has fatigue. She is taking ferrous sulfate as recommended  Menorrhagia: she had tubal ligation in 2003 and stopped using contraception (used to be on the Depo). The following year she developed heavy cycles, with clots for 5 days and light for a couple more days. She was re-started on ocp November 2016  but cycles are still heavy with clots, but only lasting 3-4 days. Discussed other options, such as going back on Depo, Nexplanon or IUD, but she does not want to change regiment at this time.   Large Breast: having back pain,finished PT, pain is described as a constant pain on mid back, sometimes aching, other times sharp, worse over trapezium muscle. Referral was made for her to see surgeon and have breast reduction surgery. She is still using her bands and trying to lose weight to improve symptoms  Obesity: she has been  walking more, also change her diet, packing her own meals for work, or eating take out salad. Sometimes eats from the jail menu. She has lost 5 lbs since last visit  Patient Active Problem List   Diagnosis Date Noted  . Seasonal allergic rhinitis 05/16/2015  . Anxiety and depression 12/21/2014  . History of eclampsia 12/15/2014  . Hypertension, benign 12/15/2014  . Large breasts 12/15/2014  . Obesity (BMI 35.0-39.9 without comorbidity) ([place]) 12/15/2014  . Thoracic back pain 12/15/2014  . Vitamin D deficiency 12/15/2014    Past Surgical History  Procedure Laterality Date  . Tubal ligation    . Eye surgery      Family History  Problem Relation Age of Onset  . Cancer Mother     eye tumor  . Diabetes Mother   . Lupus Mother   . Alcohol abuse Father   . Drug abuse Father   . Eczema Daughter   . ADD / ADHD Daughter   . ADD / ADHD Son   . Allergic Disorder Son   . Lupus Sister   . Depression Sister   . Hypertension Brother     Social History   Social History  . Marital Status: Single    Spouse Name: N/A  . Number of Children: N/A  . Years of Education: N/A   Occupational History  .  Not on file.   Social History Main Topics  . Smoking status: Never Smoker   . Smokeless tobacco: Never Used  . Alcohol Use: No  . Drug Use: No  . Sexual Activity: Not Currently   Other Topics Concern  . Not on file   Social History Narrative     Current outpatient prescriptions:  .  albuterol (PROVENTIL HFA;VENTOLIN HFA) 108 (90 BASE) MCG/ACT inhaler, Inhale 2 puffs into the lungs every 6 (six) hours as needed for wheezing or shortness of breath., Disp: 1 Inhaler, Rfl: 0 .  ferrous sulfate 325 (65 FE) MG tablet, Take 1 tablet (325 mg total) by mouth 2 (two) times daily with a meal., Disp: 60 tablet, Rfl: 2 .  ibuprofen (ADVIL,MOTRIN) 600 MG tablet, Take by mouth., Disp: , Rfl:  .  Multiple Vitamin (MULTIVITAMIN) tablet, Take 1 tablet by mouth daily., Disp: , Rfl:  .   norgestimate-ethinyl estradiol (ORTHO-CYCLEN, 28,) 0.25-35 MG-MCG tablet, Take 1 tablet by mouth daily., Disp: 1 Package, Rfl: 11 .  Omega-3 Fatty Acids (FISH OIL) 1000 MG CAPS, Take 1 capsule by mouth once a week., Disp: , Rfl:  .  vitamin C (ASCORBIC ACID) 500 MG tablet, Take 500 mg by mouth daily. Reported on 02/01/2015, Disp: , Rfl:  .  cetirizine (ZYRTEC) 10 MG tablet, Take 1 tablet (10 mg total) by mouth daily., Disp: 30 tablet, Rfl: 0 .  Cholecalciferol (VITAMIN D) 2000 units CAPS, Take 1 capsule (2,000 Units total) by mouth daily., Disp: 30 capsule, Rfl: 0  Allergies  Allergen Reactions  . Cherry Hives and Swelling     ROS  Ten systems reviewed and is negative except as mentioned in HPI   Objective  Filed Vitals:   05/16/15 0911  BP: 124/88  Pulse: 79  Temp: 98 F (36.7 C)  TempSrc: Oral  Resp: 16  Height: 5' (1.524 m)  Weight: 186 lb 1.6 oz (84.414 kg)  SpO2: 98%    Body mass index is 36.34 kg/(m^2).  Physical Exam  Constitutional: Patient appears well-developed and well-nourished. Obese  No distress.  HEENT: head atraumatic, normocephalic, pupils equal and reactive to light, neck supple, throat within normal limits Cardiovascular: Normal rate, regular rhythm and normal heart sounds.  No murmur heard. No BLE edema. Pulmonary/Chest: Effort normal and breath sounds normal. No respiratory distress. Abdominal: Soft.  There is no tenderness. Breast: large Psychiatric: Patient has a normal mood and affect. behavior is normal. Judgment and thought content normal. Muscular Skeletal pain during palpation of trapezium bilaterally and mid upper back, pain when she rotates her neck to the right  Recent Results (from the past 2160 hour(s))  Potassium     Status: None   Collection Time: 02/15/15  1:55 PM  Result Value Ref Range   Potassium 4.4 3.5 - 5.2 mmol/L  Vitamin D (25 hydroxy)     Status: None   Collection Time: 02/15/15  1:55 PM  Result Value Ref Range   Vit D,  25-Hydroxy 32.6 30.0 - 100.0 ng/mL    Comment: Vitamin D deficiency has been defined by the [place] and an Endocrine Society practice guideline as a level of serum 25-OH vitamin D less than 20 ng/mL (1,2). The Endocrine Society went on to further define vitamin D insufficiency as a level between 21 and 29 ng/mL (2). 1. IOM (Institute of Medicine). 2010. Dietary reference    intakes for calcium and D. [place]: The    [employer]. 2. Holick MF,  [place], Bischoff-Ferrari HA, et al.    Evaluation, treatment, and prevention of vitamin D    deficiency: an Endocrine Society clinical practice    guideline. JCEM. 2011 Jul; 96(7):1911-30.   Ferritin     Status: None   Collection Time: 02/15/15  1:55 PM  Result Value Ref Range   Ferritin 55 15 - 150 ng/mL  Specimen status report     Status: None   Collection Time: 02/15/15  1:55 PM  Result Value Ref Range   specimen status report Comment     Comment: No Test Indicated A lavender top tube was received with no test indicated Dear Doctor, The requisition we received for the above patient has no test indicated on the request form for one or more of the specimens submitted.  The [place] Code of [employer] requires a written and signed request be forwarded to the testing laboratory following the verbal order of a laboratory test. Date:_________________________________ ICD-9/10 Diagnosis Code(s):___________________________________________ Physician or Authorized Designee Signature: ______________________________________________________________________ Your signature confirms your order of the test(s) listed Required test name(s):________________________________________________ Required test number(s):______________________________________________ Please provide requested information and fax to [phone] or [phone] to expedite testing.       PHQ2/9: Depression screen [facility] 2/9  05/16/2015 02/15/2015 12/15/2014  Decreased Interest 0 0 0  Down, Depressed, Hopeless 0 0 0  PHQ - 2 Score 0 0 0    Fall Risk: Fall Risk  05/16/2015 02/15/2015 12/15/2014  Falls in the past year? No No No     Functional Status Survey: Is the patient deaf or have difficulty hearing?: No Does the patient have difficulty seeing, even when wearing glasses/contacts?: No Does the patient have difficulty concentrating, remembering, or making decisions?: No Does the patient have difficulty walking or climbing stairs?: No Does the patient have difficulty dressing or bathing?: No Does the patient have difficulty doing errands alone such as visiting a doctor's office or shopping?: No   Assessment & Plan  1. Large breasts  - Ambulatory referral to Plastic Surgery  2. Bilateral thoracic back pain  Continue exercise at home, that she learned from PT and hopefully symptoms will resolve with breast reduction surgery   3. History of iron deficiency  Continue otc ferrous sulfate, but should double dose during her cycle, can also colace 100 mg daily   4. History of Hypertension, benign  BP has been at goal, it was secondary to stree  5. Obesity (BMI 35.0-39.9 without comorbidity) ([place])  Discussed with the patient the risk posed by an increased BMI. Discussed importance of portion control, calorie counting and at least 150 minutes of physical activity weekly. Avoid sweet beverages and drink more water. Eat at least 6 servings of fruit and vegetables daily   6. Vitamin D deficiency  - Cholecalciferol (VITAMIN D) 2000 units CAPS; Take 1 capsule (2,000 Units total) by mouth daily.  Dispense: 30 capsule; Refill: 0  7. Seasonal allergic rhinitis  - cetirizine (ZYRTEC) 10 MG tablet; Take 1 tablet (10 mg total) by mouth daily.  Dispense: 30 tablet; Refill: 0   8. Wheezing  It happens during Spring, also had to use inhaler during recent episode of CAP ( Dec 2016 ) -Spirometry

## 2015-07-04 ENCOUNTER — Encounter: Payer: Self-pay | Admitting: Family Medicine

## 2015-07-04 ENCOUNTER — Ambulatory Visit (INDEPENDENT_AMBULATORY_CARE_PROVIDER_SITE_OTHER): Payer: Managed Care, Other (non HMO) | Admitting: Family Medicine

## 2015-07-04 VITALS — BP 126/82 | HR 89 | Temp 98.1°F | Resp 16 | Ht 60.0 in | Wt 188.1 lb

## 2015-07-04 DIAGNOSIS — N62 Hypertrophy of breast: Secondary | ICD-10-CM

## 2015-07-04 DIAGNOSIS — Z124 Encounter for screening for malignant neoplasm of cervix: Secondary | ICD-10-CM

## 2015-07-04 DIAGNOSIS — A499 Bacterial infection, unspecified: Secondary | ICD-10-CM | POA: Diagnosis not present

## 2015-07-04 DIAGNOSIS — Z0000 Encounter for general adult medical examination without abnormal findings: Secondary | ICD-10-CM

## 2015-07-04 DIAGNOSIS — D509 Iron deficiency anemia, unspecified: Secondary | ICD-10-CM | POA: Diagnosis not present

## 2015-07-04 DIAGNOSIS — N760 Acute vaginitis: Secondary | ICD-10-CM | POA: Diagnosis not present

## 2015-07-04 DIAGNOSIS — Z01419 Encounter for gynecological examination (general) (routine) without abnormal findings: Secondary | ICD-10-CM

## 2015-07-04 DIAGNOSIS — E559 Vitamin D deficiency, unspecified: Secondary | ICD-10-CM | POA: Diagnosis not present

## 2015-07-04 DIAGNOSIS — B9689 Other specified bacterial agents as the cause of diseases classified elsewhere: Secondary | ICD-10-CM

## 2015-07-04 MED ORDER — METRONIDAZOLE 500 MG PO TABS: 500.0000 mg | ORAL_TABLET | Freq: Three times a day (TID) | ORAL | Status: DC

## 2015-07-04 NOTE — Progress Notes (Signed)
Name: Anita Newton   MRN: [id number]    DOB: 11-26-1980   Date:07/04/2015       Progress Note  Subjective  Chief Complaint  Chief Complaint  Patient presents with  . Annual Exam  . Vaginal Discharge    hx of bv    HPI   Well Woman: she has not been sexually active since her last pap smear, when sexually active she uses condoms. She needs a pap smear to get a discount with her insurance. Last one was Nov 2016 but she states she was on a different insurance. She has mild urinary frequency and urgency when off work, but while at work for 12 hours she can void only once  Menorrhagia: she had tubal ligation in 2003 and stopped using contraception. The following year she developed heavy cycles, with clots for 5 days and light for a couple more days. She is back on ocp since Nov 2016 and cycles are not as heavy, but still has intermittent clotting.   Well Woman: she has not been sexually active since her last pap smear, when sexually active she uses condoms. She needs a pap smear to get a discount with her insurance. Last one was Nov 2016 but she states she was on a different insurance. She has mild urinary frequency and urgency when off work, but while at work for 12 hours she can void only once  Large Breast: having back pain, she finished PT, she continues to have pain, we will send notes to Dr. Tula Nakayama to be evaluated for breast reduction surgery. Pain right now is 8/10  Recurrent BV: she states she has noticed some odor and increase in vaginal discharge, no itching.   Patient Active Problem List   Diagnosis Date Noted  . Seasonal allergic rhinitis 05/16/2015  . Anxiety and depression 12/21/2014  . History of eclampsia 12/15/2014  . Hypertension, benign 12/15/2014  . Large breasts 12/15/2014  . Obesity (BMI 35.0-39.9 without comorbidity) ([employer]) 12/15/2014  . Thoracic back pain 12/15/2014  . Vitamin D deficiency 12/15/2014    Past Surgical History  Procedure Laterality Date  .  Tubal ligation    . Eye surgery      Family History  Problem Relation Age of Onset  . Cancer Mother     eye tumor  . Diabetes Mother   . Lupus Mother   . Alcohol abuse Father   . Drug abuse Father   . Eczema Daughter   . ADD / ADHD Daughter   . ADD / ADHD Son   . Allergic Disorder Son   . Lupus Sister   . Depression Sister   . Hypertension Brother     Social History   Social History  . Marital Status: Single    Spouse Name: N/A  . Number of Children: N/A  . Years of Education: N/A   Occupational History  . Not on file.   Social History Main Topics  . Smoking status: Never Smoker   . Smokeless tobacco: Never Used  . Alcohol Use: No  . Drug Use: No  . Sexual Activity: Not Currently   Other Topics Concern  . Not on file   Social History Narrative     Current outpatient prescriptions:  .  albuterol (PROVENTIL HFA;VENTOLIN HFA) 108 (90 BASE) MCG/ACT inhaler, Inhale 2 puffs into the lungs every 6 (six) hours as needed for wheezing or shortness of breath., Disp: 1 Inhaler, Rfl: 0 .  cetirizine (ZYRTEC) 10 MG tablet, Take 1  tablet (10 mg total) by mouth daily., Disp: 30 tablet, Rfl: 0 .  Cholecalciferol (VITAMIN D) 2000 units CAPS, Take 1 capsule (2,000 Units total) by mouth daily., Disp: 30 capsule, Rfl: 0 .  ferrous sulfate 325 (65 FE) MG tablet, Take 1 tablet (325 mg total) by mouth 2 (two) times daily with a meal., Disp: 60 tablet, Rfl: 2 .  ibuprofen (ADVIL,MOTRIN) 600 MG tablet, Take by mouth., Disp: , Rfl:  .  metroNIDAZOLE (FLAGYL) 500 MG tablet, Take 1 tablet (500 mg total) by mouth 3 (three) times daily., Disp: 21 tablet, Rfl: 0 .  Multiple Vitamin (MULTIVITAMIN) tablet, Take 1 tablet by mouth daily., Disp: , Rfl:  .  norgestimate-ethinyl estradiol (ORTHO-CYCLEN, 28,) 0.25-35 MG-MCG tablet, Take 1 tablet by mouth daily., Disp: 1 Package, Rfl: 11 .  Omega-3 Fatty Acids (FISH OIL) 1000 MG CAPS, Take 1 capsule by mouth once a week., Disp: , Rfl:  .  vitamin C  (ASCORBIC ACID) 500 MG tablet, Take 500 mg by mouth daily. Reported on 02/01/2015, Disp: , Rfl:   Allergies  Allergen Reactions  . Cherry Hives and Swelling     ROS  Constitutional: Negative for fever or weight change.  Respiratory: Negative for cough and shortness of breath.   Cardiovascular: Negative for chest pain or palpitations.  Gastrointestinal: Negative for abdominal pain, no bowel changes.  Musculoskeletal: Negative for gait problem or joint swelling.  Skin: Negative for rash.  Neurological: Negative for dizziness or headache.  No other specific complaints in a complete review of systems (except as listed in HPI above).  Objective  Filed Vitals:   07/04/15 1127  BP: 126/82  Pulse: 89  Temp: 98.1 F (36.7 C)  TempSrc: Oral  Resp: 16  Height: 5' (1.524 m)  Weight: 188 lb 1.6 oz (85.322 kg)  SpO2: 98%    Body mass index is 36.74 kg/(m^2).  Physical Exam    Constitutional: Patient appears well-developed and obese. No distress.  HENT: Head: Normocephalic and atraumatic. Ears: B TMs ok, no erythema or effusion; Nose: Nose normal. Mouth/Throat: Oropharynx is clear and moist. No oropharyngeal exudate.  Eyes: Conjunctivae and EOM are normal. Pupils are equal, round, and reactive to light. No scleral icterus.  Neck: Normal range of motion. Neck supple. No JVD present. No thyromegaly present.  Cardiovascular: Normal rate, regular rhythm and normal heart sounds.  No murmur heard. No BLE edema. Pulmonary/Chest: Effort normal and breath sounds normal. No respiratory distress. Abdominal: Soft. Bowel sounds are normal, no distension. There is no tenderness. no masses Breast: no lumps or masses, no nipple discharge or rashes, large pendulous breast  FEMALE GENITALIA:  External genitalia normal External urethra normal Vaginal vault normal without discharge or lesions Cervix normal without discharge or lesions Bimanual exam normal without masses RECTAL: not  done Musculoskeletal: Normal range of motion, no joint effusions. No gross deformities Neurological: he is alert and oriented to person, place, and time. No cranial nerve deficit. Coordination, balance, strength, speech and gait are normal.  Skin: Skin is warm and dry. No rash noted. No erythema.  Psychiatric: Patient has a normal mood and affect. behavior is normal. Judgment and thought content normal.   PHQ2/9: Depression screen [facility] 2/9 07/04/2015 05/16/2015 02/15/2015 12/15/2014  Decreased Interest 0 0 0 0  Down, Depressed, Hopeless 0 0 0 0  PHQ - 2 Score 0 0 0 0     Fall Risk: Fall Risk  07/04/2015 05/16/2015 02/15/2015 12/15/2014  Falls in the past year? No No  No No     Functional Status Survey: Is the patient deaf or have difficulty hearing?: No Does the patient have difficulty seeing, even when wearing glasses/contacts?: No Does the patient have difficulty concentrating, remembering, or making decisions?: No Does the patient have difficulty walking or climbing stairs?: No Does the patient have difficulty dressing or bathing?: No Does the patient have difficulty doing errands alone such as visiting a doctor's office or shopping?: No    Assessment & Plan  1. Well woman exam  Discussed importance of 150 minutes of physical activity weekly, eat two servings of fish weekly, eat one serving of tree nuts ( cashews, pistachios, pecans, almonds.Marland Kitchen) every other day, eat 6 servings of fruit/vegetables daily and drink plenty of water and avoid sweet beverages.   2. Cervical cancer screening  - Pap IG, CT/NG NAA, and HPV (high risk)  3. Bacterial vaginosis  - metroNIDAZOLE (FLAGYL) 500 MG tablet; Take 1 tablet (500 mg total) by mouth 3 (three) times daily.  Dispense: 21 tablet; Refill: 0  4. Large breasts  Refer to Dr. Tula Nakayama [phone]  5. Vitamin D deficiency  - VITAMIN D 25 Hydroxy (Vit-D Deficiency, Fractures)  6. Iron deficiency anemia  - CBC with Differential/Platelet -  Iron - Iron and TIBC - Ferritin

## 2015-07-06 LAB — PAP IG, CT-NG NAA, HPV HIGH-RISK
Chlamydia, Nuc. Acid Amp: NEGATIVE
GONOCOCCUS BY NUCLEIC ACID AMP: NEGATIVE
HPV, high-risk: NEGATIVE
PAP Smear Comment: 0

## 2015-07-16 ENCOUNTER — Encounter: Payer: Managed Care, Other (non HMO) | Admitting: Family Medicine

## 2015-10-29 ENCOUNTER — Other Ambulatory Visit: Payer: Self-pay

## 2015-10-29 DIAGNOSIS — Z299 Encounter for prophylactic measures, unspecified: Secondary | ICD-10-CM

## 2015-10-29 NOTE — Progress Notes (Signed)
Patient came in to have blood drawn for testing per Dr. Ancil Boozer request.

## 2015-10-30 LAB — IRON AND TIBC
Iron Saturation: 11 % — ABNORMAL LOW (ref 15–55)
Iron: 50 ug/dL (ref 27–159)
TIBC: 442 ug/dL (ref 250–450)
UIBC: 392 ug/dL (ref 131–425)

## 2015-10-30 LAB — CBC WITH DIFFERENTIAL/PLATELET
BASOS ABS: 0 10*3/uL (ref 0.0–0.2)
Basos: 1 %
EOS (ABSOLUTE): 0.1 10*3/uL (ref 0.0–0.4)
Eos: 1 %
Hematocrit: 33.5 % — ABNORMAL LOW (ref 34.0–46.6)
Hemoglobin: 11.1 g/dL (ref 11.1–15.9)
Immature Grans (Abs): 0 10*3/uL (ref 0.0–0.1)
Immature Granulocytes: 0 %
LYMPHS ABS: 2.5 10*3/uL (ref 0.7–3.1)
Lymphs: 40 %
MCH: 29.5 pg (ref 26.6–33.0)
MCHC: 33.1 g/dL (ref 31.5–35.7)
MCV: 89 fL (ref 79–97)
MONOS ABS: 0.4 10*3/uL (ref 0.1–0.9)
Monocytes: 6 %
NEUTROS PCT: 52 %
Neutrophils Absolute: 3.3 10*3/uL (ref 1.4–7.0)
PLATELETS: 326 10*3/uL (ref 150–379)
RBC: 3.76 x10E6/uL — AB (ref 3.77–5.28)
RDW: 13.1 % (ref 12.3–15.4)
WBC: 6.3 10*3/uL (ref 3.4–10.8)

## 2015-10-30 LAB — FERRITIN: Ferritin: 17 ng/mL (ref 15–150)

## 2015-10-30 LAB — VITAMIN D 25 HYDROXY (VIT D DEFICIENCY, FRACTURES): VIT D 25 HYDROXY: 20.7 ng/mL — AB (ref 30.0–100.0)

## 2015-11-01 ENCOUNTER — Other Ambulatory Visit: Payer: Self-pay | Admitting: Family Medicine

## 2015-11-01 MED ORDER — VITAMIN D (ERGOCALCIFEROL) 1.25 MG (50000 UNIT) PO CAPS: 50000.0000 [IU] | ORAL_CAPSULE | ORAL | 0 refills | Status: DC

## 2015-11-01 NOTE — Progress Notes (Unsigned)
vit

## 2015-11-08 IMAGING — US US BREAST*L* LIMITED INC AXILLA
1 series · 13 of 18 positions shown · non-contrast
Comparison: Images from [DATE]

CLINICAL DATA: 35-year-old patient presents for delayed follow-up
after [place] mamm[place] were performed in [[place] At that time, no
suspicious findings were seen, and a six-month follow-up was
recommended, but the patient was unable to return. She is
asymptomatic currently.

EXAM:
2D DIGITAL DIAGNOSTIC BILATERAL MAMMOGRAM WITH CAD AND ADJUNCT TOMO
ULTRASOUND LEFT BREAST

[Series 1: us breast*left* limited inc axilla · 0.06mm/px · 13 of 18 slices shown]
[im 1/18]
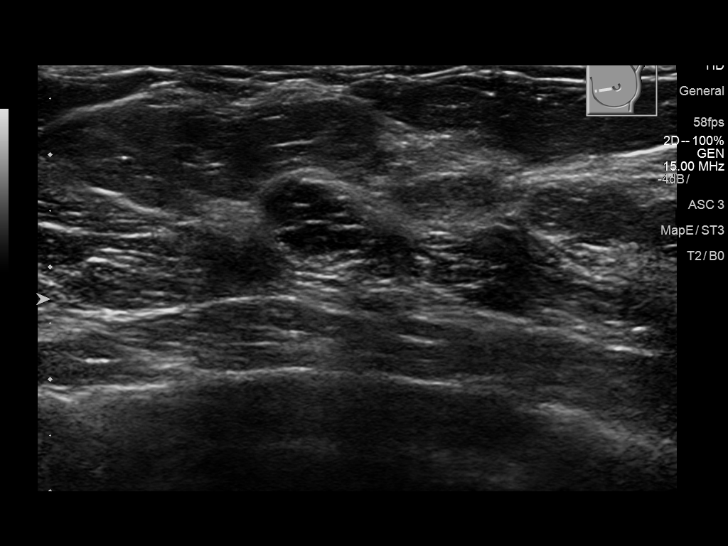
[im 3/18]
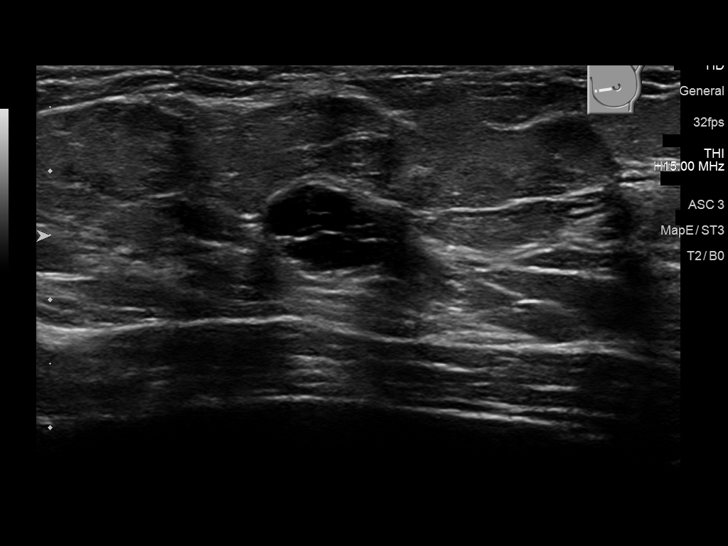
[im 4/18]
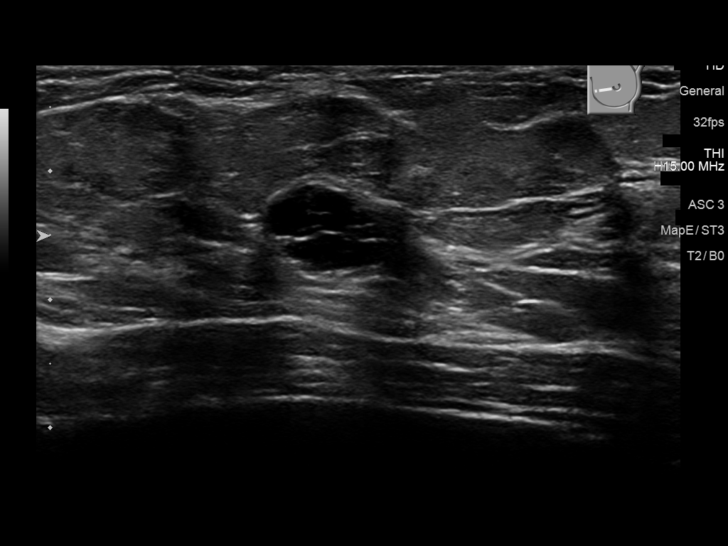
[im 5/18]
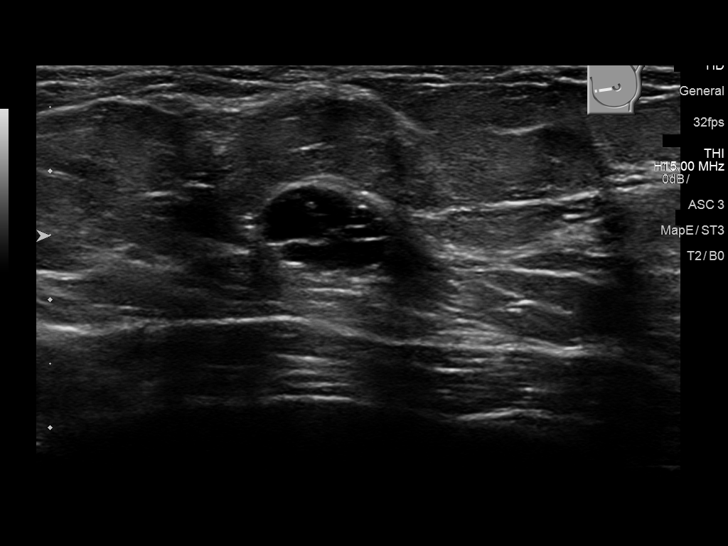
[im 7/18]
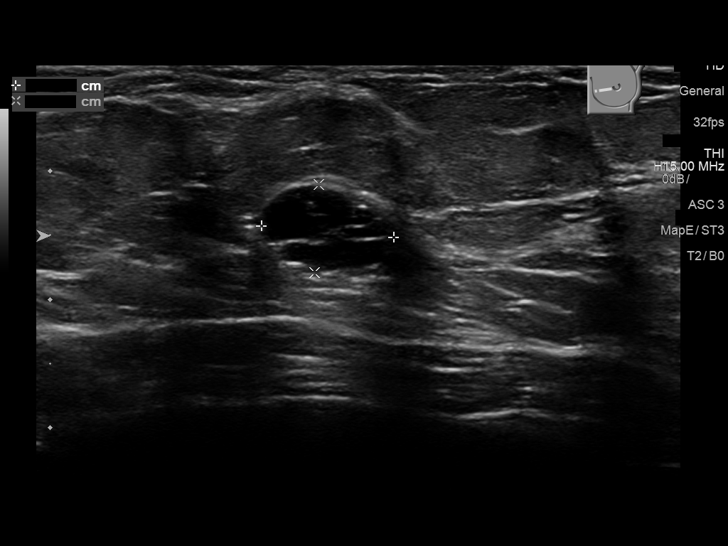
[im 8/18]
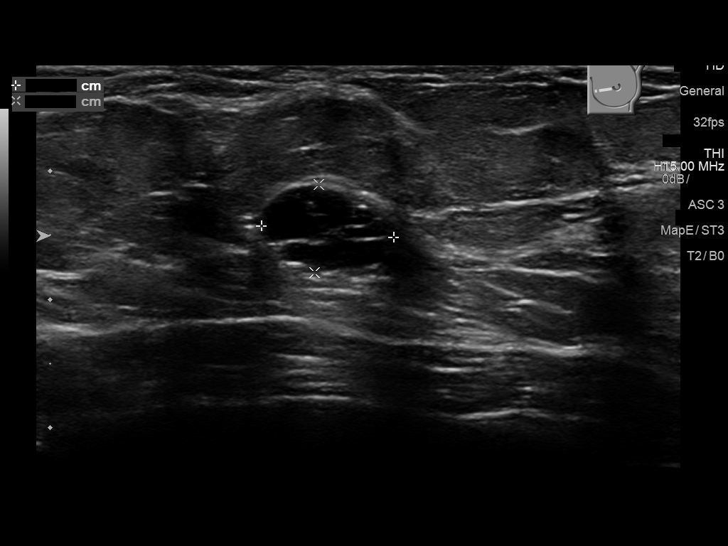
[im 10/18]
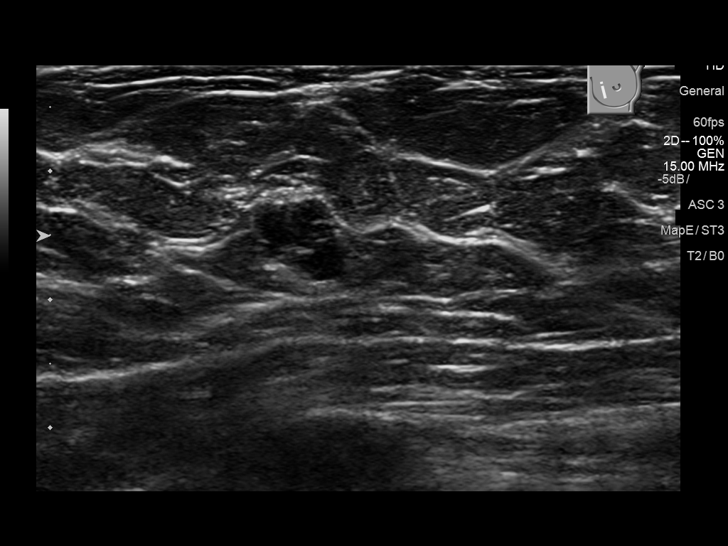
[im 11/18]
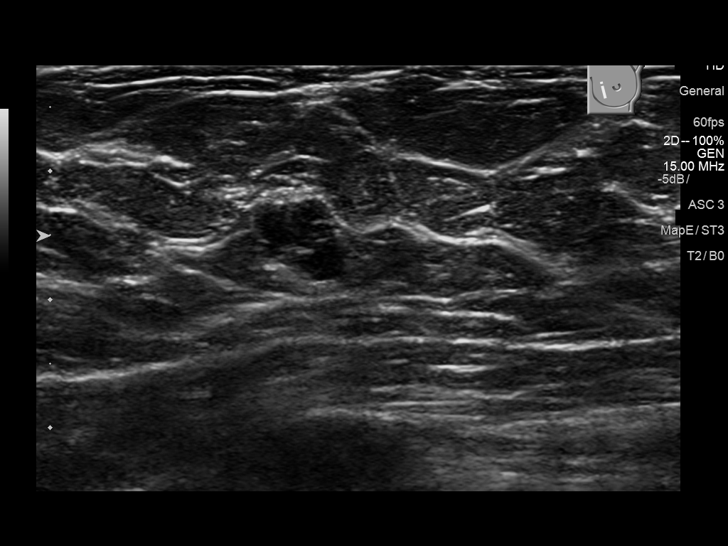
[im 12/18]
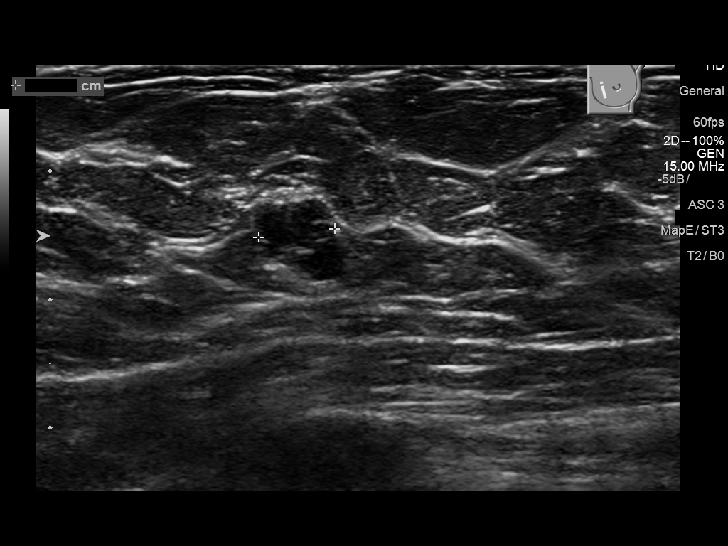
[im 14/18]
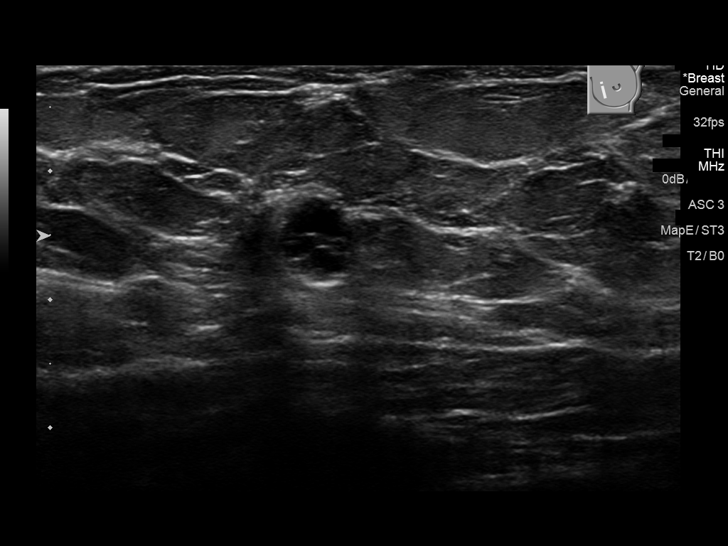
[im 15/18]
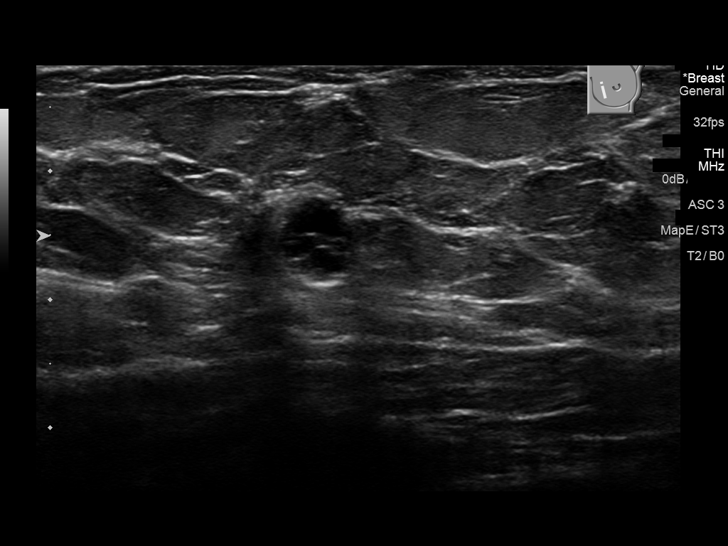
[im 16/18]
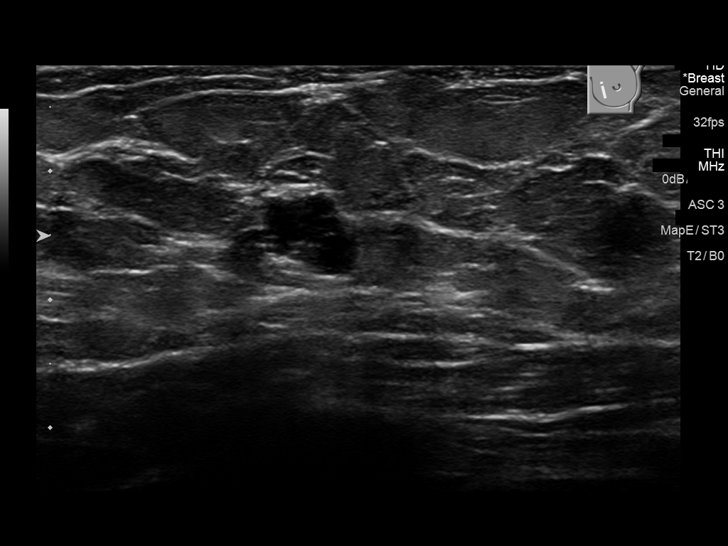
[im 18/18]
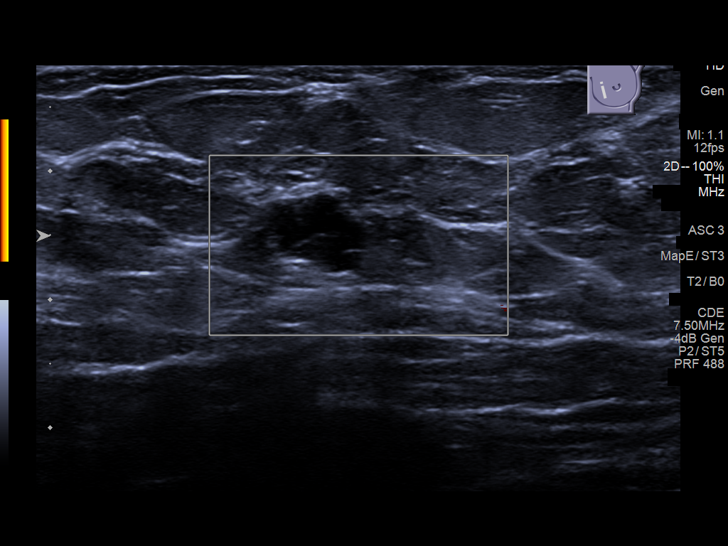

[13 of 18 positions shown; findings below may reference images not displayed]

ACR Breast Density Category c: The breast tissue is heterogeneously
dense, which may obscure small masses.
FINDINGS: There is a circumscribed oval mass in the deep upper inner left
breast. Otherwise, no suspicious mass, distortion, or suspicious
microcalcification is identified in either breast.

Mammographic images were processed with CAD.

Targeted ultrasound is performed, showing cluster of cysts at 9
o'clock position 7 cm from the nipple measuring 1.0 x 0.7 x 0.6 cm.
There is no associated vascular flow.
IMPRESSION: No evidence of malignancy in either breast. Benign cluster of cysts
in the 9 o'clock position the left breast.

RECOMMENDATION:
Screening mammogram at age 39 unless there are persistent or
intervening clinical concerns. (Code:[[place]])

I have discussed the findings and recommendations with the patient.
Results were also provided in writing at the conclusion of the
visit. If applicable, a reminder letter will be sent to the patient
regarding the next appointment.

BI-RADS CATEGORY  2: Benign.

## 2015-11-29 ENCOUNTER — Encounter: Payer: Self-pay | Admitting: Family Medicine

## 2015-11-29 ENCOUNTER — Ambulatory Visit (INDEPENDENT_AMBULATORY_CARE_PROVIDER_SITE_OTHER): Payer: Managed Care, Other (non HMO) | Admitting: Family Medicine

## 2015-11-29 VITALS — BP 138/88 | HR 99 | Temp 98.1°F | Resp 16 | Ht 60.0 in | Wt 190.1 lb

## 2015-11-29 DIAGNOSIS — I10 Essential (primary) hypertension: Secondary | ICD-10-CM | POA: Diagnosis not present

## 2015-11-29 DIAGNOSIS — F430 Acute stress reaction: Secondary | ICD-10-CM

## 2015-11-29 DIAGNOSIS — D500 Iron deficiency anemia secondary to blood loss (chronic): Secondary | ICD-10-CM | POA: Diagnosis not present

## 2015-11-29 DIAGNOSIS — N920 Excessive and frequent menstruation with regular cycle: Secondary | ICD-10-CM | POA: Diagnosis not present

## 2015-11-29 DIAGNOSIS — E559 Vitamin D deficiency, unspecified: Secondary | ICD-10-CM

## 2015-11-29 LAB — POCT URINE PREGNANCY: PREG TEST UR: NEGATIVE

## 2015-11-29 MED ORDER — MEDROXYPROGESTERONE ACETATE 150 MG/ML IM SUSP: 150.0000 mg | INTRAMUSCULAR | 3 refills | Status: DC
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
## 2015-12-06 ENCOUNTER — Ambulatory Visit (INDEPENDENT_AMBULATORY_CARE_PROVIDER_SITE_OTHER): Payer: Managed Care, Other (non HMO)

## 2015-12-06 DIAGNOSIS — N921 Excessive and frequent menstruation with irregular cycle: Secondary | ICD-10-CM

## 2015-12-06 MED ORDER — MEDROXYPROGESTERONE ACETATE 150 MG/ML IM SUSY
150.0000 mg | PREFILLED_SYRINGE | INTRAMUSCULAR | Status: AC
  Administered 2015-12-06: 150 mg via INTRAMUSCULAR

## 2016-03-03 ENCOUNTER — Ambulatory Visit (INDEPENDENT_AMBULATORY_CARE_PROVIDER_SITE_OTHER): Payer: Managed Care, Other (non HMO) | Admitting: Family Medicine

## 2016-03-03 ENCOUNTER — Encounter: Payer: Self-pay | Admitting: Family Medicine

## 2016-03-03 VITALS — BP 132/92 | HR 81 | Temp 98.0°F | Resp 16 | Ht 60.0 in | Wt 191.5 lb

## 2016-03-03 DIAGNOSIS — E559 Vitamin D deficiency, unspecified: Secondary | ICD-10-CM | POA: Diagnosis not present

## 2016-03-03 DIAGNOSIS — N921 Excessive and frequent menstruation with irregular cycle: Secondary | ICD-10-CM

## 2016-03-03 DIAGNOSIS — I10 Essential (primary) hypertension: Secondary | ICD-10-CM | POA: Diagnosis not present

## 2016-03-03 DIAGNOSIS — J302 Other seasonal allergic rhinitis: Secondary | ICD-10-CM | POA: Diagnosis not present

## 2016-03-03 DIAGNOSIS — N898 Other specified noninflammatory disorders of vagina: Secondary | ICD-10-CM

## 2016-03-03 MED ORDER — MEDROXYPROGESTERONE ACETATE 150 MG/ML IM SUSP
150.0000 mg | Freq: Once | INTRAMUSCULAR | Status: AC
  Administered 2016-03-03: 150 mg via INTRAMUSCULAR

## 2016-03-03 MED ORDER — VITAMIN D (ERGOCALCIFEROL) 1.25 MG (50000 UNIT) PO CAPS: 50000.0000 [IU] | ORAL_CAPSULE | ORAL | 0 refills | Status: DC

## 2016-03-03 MED ORDER — HYDROCHLOROTHIAZIDE 12.5 MG PO TABS: 12.5000 mg | ORAL_TABLET | Freq: Every day | ORAL | 3 refills | Status: DC

## 2016-03-03 MED ORDER — METRONIDAZOLE 500 MG PO TABS: 500.0000 mg | ORAL_TABLET | Freq: Two times a day (BID) | ORAL | 0 refills | Status: DC

## 2016-03-03 MED ORDER — MONTELUKAST SODIUM 10 MG PO TABS: 10.0000 mg | ORAL_TABLET | Freq: Every day | ORAL | 3 refills | Status: DC

## 2016-03-03 NOTE — Addendum Note (Signed)
Addended by: Lolita Rieger D on: 03/03/2016 10:06 AM   Modules accepted: Orders

## 2016-03-03 NOTE — Progress Notes (Signed)
Name: Anita Newton   MRN: [id number]    DOB: 08-May-1980   Date:03/03/2016       Progress Note  Subjective  Chief Complaint  Chief Complaint  Patient presents with  . Menorrhagia    depo shot   . Stress    geting better  . Hypertension    controlled with diet  . Vaginal Bleeding    pt stated that she is still having light bleeding and has to wear pads due to it and that she thinks that her BV is back due to that    HPI  Menorrhagia: she has a history of heavy cycles, she was on Depo and got pregnant with her son in 2002, so she went on ocp's, and got pregnant with her daughter ( now 12 yo ) and had tubal ligation, however last year went back ocp for cycle regulation and because cycles were long and heavy. She resumed Depo 11/2015, still spotting, but not heavy, second shot today  Anemia: iron deficiency, ferritin has dropped and still taking iron supplementation otc. On Depo now, we will recheck next visit  Vitamin D Deficiency: taking rx vitamin D now, we will send refill of rx vitamin D   HTN: on life style modification only now, no chest pain or palpitation, bp is higher than before, discussed importance of being treated now, she agrees.   BV: she has noticed that since she has been wearing a panty liner for spotting, she has developed vaginal odor and mild discharge - brown in color, she has a history of recurrent BV and would like to be treated.   Seasonal Allergies: she states worse during Spring and Summer, worse at work, makes her wheeze and we will try Singulair  Patient Active Problem List   Diagnosis Date Noted  . Seasonal allergic rhinitis 05/16/2015  . History of eclampsia 12/15/2014  . Hypertension, benign 12/15/2014  . Large breasts 12/15/2014  . Obesity (BMI 35.0-39.9 without comorbidity) 12/15/2014  . Thoracic back pain 12/15/2014  . Vitamin D deficiency 12/15/2014    Past Surgical History:  Procedure Laterality Date  . EYE SURGERY    . TUBAL  LIGATION      Family History  Problem Relation Age of Onset  . Cancer Mother     eye tumor  . Diabetes Mother   . Lupus Mother   . Alcohol abuse Father   . Drug abuse Father   . Eczema Daughter   . ADD / ADHD Daughter   . ADD / ADHD Son   . Allergic Disorder Son   . Lupus Sister   . Depression Sister   . Hypertension Brother     Social History   Social History  . Marital status: Single    Spouse name: N/A  . Number of children: N/A  . Years of education: N/A   Occupational History  . Not on file.   Social History Main Topics  . Smoking status: Never Smoker  . Smokeless tobacco: Never Used  . Alcohol use No  . Drug use: No  . Sexual activity: Not Currently   Other Topics Concern  . Not on file   Social History Narrative  . No narrative on file     Current Outpatient Prescriptions:  .  cetirizine (ZYRTEC) 10 MG tablet, Take 1 tablet (10 mg total) by mouth daily., Disp: 30 tablet, Rfl: 0 .  Cholecalciferol (VITAMIN D) 2000 units CAPS, Take 1 capsule (2,000 Units total) by mouth daily.,  Disp: 30 capsule, Rfl: 0 .  ferrous sulfate 325 (65 FE) MG tablet, Take 1 tablet (325 mg total) by mouth 2 (two) times daily with a meal., Disp: 60 tablet, Rfl: 2 .  hydrochlorothiazide (HYDRODIURIL) 12.5 MG tablet, Take 1 tablet (12.5 mg total) by mouth daily., Disp: 30 tablet, Rfl: 3 .  ibuprofen (ADVIL,MOTRIN) 600 MG tablet, Take by mouth., Disp: , Rfl:  .  medroxyPROGESTERone (DEPO-PROVERA) 150 MG/ML injection, Inject 1 mL (150 mg total) into the muscle every 3 (three) months., Disp: 1 mL, Rfl: 3 .  metroNIDAZOLE (FLAGYL) 500 MG tablet, Take 1 tablet (500 mg total) by mouth 2 (two) times daily., Disp: 14 tablet, Rfl: 0 .  montelukast (SINGULAIR) 10 MG tablet, Take 1 tablet (10 mg total) by mouth at bedtime., Disp: 30 tablet, Rfl: 3 .  Multiple Vitamin (MULTIVITAMIN) tablet, Take 1 tablet by mouth daily., Disp: , Rfl:  .  Omega-3 Fatty Acids (FISH OIL) 1000 MG CAPS, Take 1  capsule by mouth once a week., Disp: , Rfl:  .  vitamin C (ASCORBIC ACID) 500 MG tablet, Take 500 mg by mouth daily. Reported on 02/01/2015, Disp: , Rfl:  .  Vitamin D, Ergocalciferol, (DRISDOL) 50000 units CAPS capsule, Take 1 capsule (50,000 Units total) by mouth every 7 (seven) days., Disp: 12 capsule, Rfl: 0  Allergies  Allergen Reactions  . Cherry Hives and Swelling     ROS  Constitutional: Negative for fever or weight change.  Respiratory: Negative for cough and shortness of breath.   Cardiovascular: Negative for chest pain or palpitations.  Gastrointestinal: Negative for abdominal pain, no bowel changes.  Musculoskeletal: Negative for gait problem or joint swelling.  Skin: Negative for rash.  Neurological: Negative for dizziness or headache.  No other specific complaints in a complete review of systems (except as listed in HPI above).  Objective  Vitals:   03/03/16 0909  BP: (!) 132/92  Pulse: 81  Resp: 16  Temp: 98 F (36.7 C)  SpO2: 97%  Weight: 191 lb 8 oz (86.9 kg)  Height: 5' (1.524 m)    Body mass index is 37.4 kg/m.  Physical Exam  Constitutional: Patient appears well-developed and well-nourished. Obese  No distress.  HEENT: head atraumatic, normocephalic, pupils equal and reactive to light, neck supple, throat within normal limits Cardiovascular: Normal rate, regular rhythm and normal heart sounds.  No murmur heard. No BLE edema. Pulmonary/Chest: Effort normal and breath sounds normal. No respiratory distress. Abdominal: Soft.  There is no tenderness. Psychiatric: Patient has a normal mood and affect. behavior is normal. Judgment and thought content normal.  PHQ2/9: Depression screen [facility] 2/9 03/03/2016 11/29/2015 07/04/2015 05/16/2015 02/15/2015  Decreased Interest 0 0 0 0 0  Down, Depressed, Hopeless 0 0 0 0 0  PHQ - 2 Score 0 0 0 0 0     Fall Risk: Fall Risk  03/03/2016 11/29/2015 07/04/2015 05/16/2015 02/15/2015  Falls in the past year? No No No No No      Functional Status Survey: Is the patient deaf or have difficulty hearing?: No Does the patient have difficulty seeing, even when wearing glasses/contacts?: No Does the patient have difficulty concentrating, remembering, or making decisions?: No Does the patient have difficulty walking or climbing stairs?: No Does the patient have difficulty dressing or bathing?: No Does the patient have difficulty doing errands alone such as visiting a doctor's office or shopping?: No    Assessment & Plan  1. Menorrhagia with irregular cycle  She is getting  second Depo today, still spotting, but not heavy now, we will monitor for now  2. Vaginal odor  She is having some vaginal discharge, not sexually active, we will treat again with Flagyl and return if does not improve - metroNIDAZOLE (FLAGYL) 500 MG tablet; Take 1 tablet (500 mg total) by mouth 2 (two) times daily.  Dispense: 14 tablet; Refill: 0  3. Seasonal allergic rhinitis, unspecified chronicity, unspecified trigger  She has used Ventolin in the past at work ( [facility] ) we will try singulair  - montelukast (SINGULAIR) 10 MG tablet; Take 1 tablet (10 mg total) by mouth at bedtime.  Dispense: 30 tablet; Refill: 3  4. Hypertension, benign  bp is high again, we will resume HCTZ - hydrochlorothiazide (HYDRODIURIL) 12.5 MG tablet; Take 1 tablet (12.5 mg total) by mouth daily.  Dispense: 30 tablet; Refill: 3  5. Vitamin D deficiency  - Vitamin D, Ergocalciferol, (DRISDOL) 50000 units CAPS capsule; Take 1 capsule (50,000 Units total) by mouth every 7 (seven) days.  Dispense: 12 capsule; Refill: 0

## 2016-05-15 ENCOUNTER — Ambulatory Visit: Payer: Self-pay | Admitting: Physician Assistant

## 2016-05-15 ENCOUNTER — Encounter: Payer: Self-pay | Admitting: Physician Assistant

## 2016-05-15 ENCOUNTER — Other Ambulatory Visit: Payer: Self-pay | Admitting: Family Medicine

## 2016-05-15 VITALS — BP 139/90 | HR 106 | Temp 97.8°F

## 2016-05-15 DIAGNOSIS — J0100 Acute maxillary sinusitis, unspecified: Secondary | ICD-10-CM

## 2016-05-15 DIAGNOSIS — J301 Allergic rhinitis due to pollen: Secondary | ICD-10-CM

## 2016-05-15 DIAGNOSIS — E559 Vitamin D deficiency, unspecified: Secondary | ICD-10-CM

## 2016-05-15 MED ORDER — AZITHROMYCIN 250 MG PO TABS: ORAL_TABLET | ORAL | 0 refills | Status: DC

## 2016-05-15 MED ORDER — FLUTICASONE PROPIONATE 50 MCG/ACT NA SUSP
2.0000 | Freq: Every day | NASAL | 6 refills | Status: DC
Start: 2016-05-15 — End: 2020-07-28

## 2016-05-15 MED ORDER — LORATADINE 10 MG PO TABS: 10.0000 mg | ORAL_TABLET | Freq: Every day | ORAL | 11 refills | Status: DC

## 2016-05-15 NOTE — Progress Notes (Signed)
S: C/o 1. runny nose and congestion for 3 -4 days, no fever, chills, cp/sob, v/d; mucus is green and thick, mixed with blood,  cough is sporadic and dry, c/o of facial and dental pain. Hx of seasonal allergies and was outside a lot this weekend in the pollen, sx became worse after being outside; 2. Elevated bp over the weekend, was taking cold meds at that time, didn't feel well, had RN at the jail take her bp and it was 160/100  Using otc meds:   O: PE: vitals wnl, nad, perrl eomi, normocephalic, tms dull, nasal mucosa red and swollen, throat injected, neck supple no lymph, lungs c t a, cv rrr, neuro intact  A:  Acute sinusitis   P: drink fluids, continue regular meds , use otc meds of choice, return if not improving in 5 days, return earlier if worsening , zpack, flonase, claritin, continue rx meds, continue to monitor bp

## 2016-05-19 ENCOUNTER — Other Ambulatory Visit: Payer: Self-pay | Admitting: Family Medicine

## 2016-05-19 DIAGNOSIS — E559 Vitamin D deficiency, unspecified: Secondary | ICD-10-CM

## 2016-05-19 MED ORDER — VITAMIN D (ERGOCALCIFEROL) 1.25 MG (50000 UNIT) PO CAPS: 50000.0000 [IU] | ORAL_CAPSULE | ORAL | 0 refills | Status: DC

## 2016-06-02 ENCOUNTER — Ambulatory Visit (INDEPENDENT_AMBULATORY_CARE_PROVIDER_SITE_OTHER): Payer: Managed Care, Other (non HMO)

## 2016-06-02 DIAGNOSIS — Z3042 Encounter for surveillance of injectable contraceptive: Secondary | ICD-10-CM | POA: Diagnosis not present

## 2016-06-02 MED ORDER — MEDROXYPROGESTERONE ACETATE 150 MG/ML IM SUSY
150.0000 mg | PREFILLED_SYRINGE | INTRAMUSCULAR | Status: AC
  Administered 2016-06-02: 150 mg via INTRAMUSCULAR

## 2016-06-02 NOTE — Progress Notes (Signed)
[id number]  Lot Number: [id number]

## 2016-07-04 ENCOUNTER — Encounter: Payer: Managed Care, Other (non HMO) | Admitting: Family Medicine

## 2016-07-08 ENCOUNTER — Encounter: Payer: Self-pay | Admitting: Family Medicine

## 2016-07-08 ENCOUNTER — Other Ambulatory Visit: Payer: Self-pay | Admitting: Family Medicine

## 2016-07-08 ENCOUNTER — Other Ambulatory Visit: Payer: Self-pay

## 2016-07-08 ENCOUNTER — Ambulatory Visit (INDEPENDENT_AMBULATORY_CARE_PROVIDER_SITE_OTHER): Payer: Managed Care, Other (non HMO) | Admitting: Family Medicine

## 2016-07-08 VITALS — BP 124/72 | HR 105 | Temp 98.1°F | Resp 16 | Ht 60.0 in | Wt 191.3 lb

## 2016-07-08 DIAGNOSIS — Z6835 Body mass index (BMI) 35.0-35.9, adult: Secondary | ICD-10-CM

## 2016-07-08 DIAGNOSIS — Z299 Encounter for prophylactic measures, unspecified: Secondary | ICD-10-CM

## 2016-07-08 DIAGNOSIS — J302 Other seasonal allergic rhinitis: Secondary | ICD-10-CM | POA: Diagnosis not present

## 2016-07-08 DIAGNOSIS — D509 Iron deficiency anemia, unspecified: Secondary | ICD-10-CM | POA: Diagnosis not present

## 2016-07-08 DIAGNOSIS — I10 Essential (primary) hypertension: Secondary | ICD-10-CM

## 2016-07-08 DIAGNOSIS — E669 Obesity, unspecified: Secondary | ICD-10-CM

## 2016-07-08 DIAGNOSIS — E559 Vitamin D deficiency, unspecified: Secondary | ICD-10-CM

## 2016-07-08 DIAGNOSIS — Z1231 Encounter for screening mammogram for malignant neoplasm of breast: Secondary | ICD-10-CM

## 2016-07-08 DIAGNOSIS — E538 Deficiency of other specified B group vitamins: Secondary | ICD-10-CM

## 2016-07-08 DIAGNOSIS — Z862 Personal history of diseases of the blood and blood-forming organs and certain disorders involving the immune mechanism: Secondary | ICD-10-CM | POA: Insufficient documentation

## 2016-07-08 DIAGNOSIS — Z01419 Encounter for gynecological examination (general) (routine) without abnormal findings: Secondary | ICD-10-CM | POA: Diagnosis not present

## 2016-07-08 DIAGNOSIS — Z13220 Encounter for screening for lipoid disorders: Secondary | ICD-10-CM

## 2016-07-08 MED ORDER — LORATADINE 10 MG PO TABS: 10.0000 mg | ORAL_TABLET | Freq: Every day | ORAL | 11 refills | Status: AC | PRN

## 2016-07-08 NOTE — Progress Notes (Signed)
Patient came in to have blood drawn for testing per Raelyn Ensign, FNP orders.

## 2016-07-08 NOTE — Progress Notes (Addendum)
Name: Anita Newton   MRN: [id number]    DOB: March 11, 1980   Date:07/08/2016       Progress Note  Subjective  Chief Complaint  Chief Complaint  Patient presents with  . Annual Exam    plus paperwork    HPI  Pt presents for CPE and biometric screening for her job as a Corporate treasurer with the [employer]   Pt works rotating every two weeks nights and days, and remains fatigued, but is otherwise doing well. She needs biometric screening for her health insurance, and will obtain labs at the [facility] employment clinic.   She also presents with the following:  B12 Deficiency: She has a history of B12 deficiency, has ongoing fatigue and would like this level checked today.   Iron deficiency anemia: She takes supplements and uses Depo-Provera to control menstrual bleeding.  She would like this to be checked today.  Vitamin D deficiency:  Has history of Vitamin D deficiency and takes supplements. She endorses ongoing faitgue. Will check levels today.  Obesity: BMI 37.36 today, discussed lifestyle changes including healthy eating and exercising while working rotating shifts. Her job makes it difficult to maintain a regular schedule, but she does exercise each morning after working a night shift.   AR: Has seasonal AR and requests refill of Claritin, has been doing well on this, also uses Flonase and Singulair. No side effects.   HTN: BP at goal today, Pt takes HCTZ 12.92m and is doing well on this, no side effects. No chest pain, shortness of breath, or edema.  USPSTF grade A and B recommendations Depression:  [facility] 07/08/2016 03/03/2016 11/29/2015 07/04/2015 05/16/2015  Decreased Interest 0 0 0 0 0  Down, Depressed, Hopeless 0 0 0 0 0  PHQ - 2 Score 0 0 0 0 0  Altered sleeping 0 - - - -  Tired, decreased energy 0 - - - -  Change in appetite 0 - - - -  Feeling bad or failure about yourself  0 - - - -  Trouble concentrating 0 - - - -  Moving slowly or  fidgety/restless 0 - - - -  Suicidal thoughts 0 - - - -  PHQ-9 Score 0 - - - -   Hypertension: BP Readings from Last 3 Encounters:  07/08/16 124/72  05/15/16 139/90  03/03/16 (!) 132/92   Obesity: Wt Readings from Last 3 Encounters:  07/08/16 191 lb 5 oz (86.8 kg)  03/03/16 191 lb 8 oz (86.9 kg)  11/29/15 190 lb 2 oz (86.2 kg)   BMI Readings from Last 3 Encounters:  07/08/16 37.36 kg/m  03/03/16 37.40 kg/m  11/29/15 37.13 kg/m    Alcohol: Socially - 5 times a year, usually has 2 drinks at a time. Tobacco use: Not using, never used. HIV: Completed in 2016 STD testing and prevention (chl/gon/syphilis): Will do with Pap today.  One female partner in the last year. Intimate partner violence: Negative Breast cancer: History of dense tissue with Bi-rads Cat 3 findings in 2007, she doesn't know her family history and was recommended to have a follow up in [place] year back in 2007. Will order today. No results found for: HMMAMMO  BRCA gene screening: unknown family history Cervical cancer screening: will do Pap Today Osteoporosis: On Depo - is running/walking 3022m at the gym after work. Does light weight lifting for arms only. No results found for: HMDEXASCAN  Fall prevention/vitamin D: 2000 units once weekly Lipids: Will  recheck today. Lab Results  Component Value Date   CHOL 168 12/20/2014   CHOL 168 12/20/2014   CHOL 143 02/26/2012   Lab Results  Component Value Date   HDL 55 12/20/2014   HDL 55 12/20/2014   HDL 44 02/26/2012   Lab Results  Component Value Date   LDLCALC 100 (H) 12/20/2014   LDLCALC 100 12/20/2014   LDLCALC 86 02/26/2012   Lab Results  Component Value Date   TRIG 63 12/20/2014   TRIG 63 12/20/2014   TRIG 63 02/26/2012   Lab Results  Component Value Date   CHOLHDL 3.1 12/20/2014   No results found for: LDLDIRECT  Glucose:  Glucose  Date Value Ref Range Status  12/20/2014 85 65 - 99 mg/dL Final   Glucose, Bld  Date Value Ref Range  Status  01/28/2015 94 65 - 99 mg/dL Final   Colorectal cancer: None known Lung cancer: Non-smoker Diet: Eats between 2pm-10pm on night shift, eats small meals with a big dinner on day shift (rotates every two weeks) Exercise: 33mns cardio each day that she works night shift  Patient Active Problem List   Diagnosis Date Noted  . Seasonal allergic rhinitis 05/16/2015  . History of eclampsia 12/15/2014  . Hypertension, benign 12/15/2014  . Large breasts 12/15/2014  . Obesity (BMI 35.0-39.9 without comorbidity) 12/15/2014  . Thoracic back pain 12/15/2014  . Vitamin D deficiency 12/15/2014    Past Surgical History:  Procedure Laterality Date  . EYE SURGERY    . TUBAL LIGATION      Family History  Problem Relation Age of Onset  . Cancer Mother        eye tumor  . Diabetes Mother   . Lupus Mother   . Alcohol abuse Father   . Drug abuse Father   . Eczema Daughter   . ADD / ADHD Daughter   . ADD / ADHD Son   . Allergic Disorder Son   . Lupus Sister   . Depression Sister   . Hypertension Brother     Social History   Social History  . Marital status: Single    Spouse name: N/A  . Number of children: N/A  . Years of education: N/A   Occupational History  . Not on file.   Social History Main Topics  . Smoking status: Never Smoker  . Smokeless tobacco: Never Used  . Alcohol use No  . Drug use: No  . Sexual activity: Not Currently   Other Topics Concern  . Not on file   Social History Narrative  . No narrative on file     Current Outpatient Prescriptions:  .  cetirizine (ZYRTEC) 10 MG tablet, Take 1 tablet (10 mg total) by mouth daily., Disp: 30 tablet, Rfl: 0 .  Cholecalciferol (VITAMIN D) 2000 units CAPS, Take 1 capsule (2,000 Units total) by mouth daily., Disp: 30 capsule, Rfl: 0 .  ferrous sulfate 325 (65 FE) MG tablet, Take 1 tablet (325 mg total) by mouth 2 (two) times daily with a meal., Disp: 60 tablet, Rfl: 2 .  fluticasone (FLONASE) 50 MCG/ACT nasal  spray, Place 2 sprays into both nostrils daily., Disp: 16 g, Rfl: 6 .  hydrochlorothiazide (HYDRODIURIL) 12.5 MG tablet, Take 1 tablet (12.5 mg total) by mouth daily., Disp: 30 tablet, Rfl: 3 .  ibuprofen (ADVIL,MOTRIN) 600 MG tablet, Take by mouth., Disp: , Rfl:  .  loratadine (CLARITIN) 10 MG tablet, Take 1 tablet (10 mg total) by mouth daily., Disp: 30  tablet, Rfl: 11 .  medroxyPROGESTERone (DEPO-PROVERA) 150 MG/ML injection, Inject 1 mL (150 mg total) into the muscle every 3 (three) months., Disp: 1 mL, Rfl: 3 .  metroNIDAZOLE (FLAGYL) 500 MG tablet, Take 1 tablet (500 mg total) by mouth 2 (two) times daily., Disp: 14 tablet, Rfl: 0 .  montelukast (SINGULAIR) 10 MG tablet, Take 1 tablet (10 mg total) by mouth at bedtime., Disp: 30 tablet, Rfl: 3 .  Multiple Vitamin (MULTIVITAMIN) tablet, Take 1 tablet by mouth daily., Disp: , Rfl:  .  Omega-3 Fatty Acids (FISH OIL) 1000 MG CAPS, Take 1 capsule by mouth once a week., Disp: , Rfl:  .  vitamin C (ASCORBIC ACID) 500 MG tablet, Take 500 mg by mouth daily. Reported on 02/01/2015, Disp: , Rfl:  .  Vitamin D, Ergocalciferol, (DRISDOL) 50000 units CAPS capsule, Take 1 capsule (50,000 Units total) by mouth every 7 (seven) days., Disp: 12 capsule, Rfl: 0  Allergies  Allergen Reactions  . Cherry Hives and Swelling     ROS  Constitutional: Negative for fever or weight change.  Respiratory: Negative for cough and shortness of breath.   Cardiovascular: Negative for chest pain or palpitations.  Gastrointestinal: Negative for abdominal pain, no bowel changes.  Musculoskeletal: Negative for gait problem or joint swelling.  Skin: Negative for rash.  Neurological: Negative for dizziness or headache.  No other specific complaints in a complete review of systems (except as listed in HPI above).  Objective  Vitals:   07/08/16 1111  BP: 124/72  Pulse: (!) 124  Resp: 16  Temp: 98.1 F (36.7 C)  SpO2: 97%  Weight: 191 lb 5 oz (86.8 kg)  Height: 5'  (1.524 m)    Body mass index is 37.36 kg/m.  Physical Exam Constitutional: Patient appears well-developed and well-nourished. No distress.  Pt is obese. HENT: Head: Normocephalic and atraumatic. Ears: B TMs ok, no erythema or effusion; Nose: Nose normal. Mouth/Throat: Oropharynx is clear and moist. No oropharyngeal exudate.  Eyes: Conjunctivae and EOM are normal. Pupils are equal, round, and reactive to light. No scleral icterus.  Neck: Normal range of motion. Neck supple. No JVD present. No thyromegaly present.  Cardiovascular: Slightly elevated at 100bpm auscultated, regular rhythm and normal heart sounds.  No murmur heard. No BLE edema. Pulmonary/Chest: Effort normal and breath sounds normal. No respiratory distress. Abdominal: Soft. Bowel sounds are normal, no distension. There is no tenderness. no masses Breast: no lumps or masses, no nipple discharge or rashes FEMALE GENITALIA:  External genitalia normal External urethra normal Vaginal vault normal with small amount of brown/red discharge present; no lesions Cervix normal without discharge or lesions Bimanual exam normal without masses RECTAL: no rectal masses or hemorrhoids Musculoskeletal: Normal range of motion, no joint effusions. No gross deformities Neurological: he is alert and oriented to person, place, and time. No cranial nerve deficit. Coordination, balance, strength, speech and gait are normal.  Skin: Skin is warm and dry. No rash noted. No erythema.  Psychiatric: Patient has a normal mood and affect. behavior is normal. Judgment and thought content normal.  No results found for this or any previous visit (from the past 2160 hour(s)).  Fall Risk: Fall Risk  03/03/2016 11/29/2015 07/04/2015 05/16/2015 02/15/2015  Falls in the past year? No No No No No   Assessment & Plan  1. Well woman exam with routine gynecological exam  - Pap IG and Chlamydia/Gonococcus, NAA  2. Breast cancer screening by mammogram  - MM DIGITAL  SCREENING BILATERAL; Future  3. Vitamin D deficiency  - VITAMIN D 25 Hydroxy (Vit-D Deficiency, Fractures)  4. Obesity (BMI 35.0-39.9 without comorbidity)  - Lipid panel - Hemoglobin A1c - Discussed lifestyle modifications including healthy eating and increasing exercise.  5. Hypertension, benign  - Comprehensive metabolic panel  6. B12 deficiency  - Vitamin B12  7. Encounter for screening for lipid disorder  - Lipid panel  8. Seasonal allergic rhinitis, unspecified trigger  - loratadine (CLARITIN) 10 MG tablet; Take 1 tablet (10 mg total) by mouth daily as needed for allergies.  Dispense: 30 tablet; Refill: 11  9. Iron deficiency anemia, unspecified iron deficiency anemia type  - Iron and TIBC - CBC w/Diff/Platelet   -USPSTF grade A and B recommendations reviewed with patient; age-appropriate recommendations, preventive care, screening tests, etc discussed and encouraged; healthy living encouraged; see AVS for patient education given to patient -Discussed importance of 150 minutes of physical activity weekly, eat two servings of fish weekly, eat one serving of tree nuts ( cashews, pistachios, pecans, almonds.Marland Kitchen) every other day, eat 6 servings of fruit/vegetables daily and drink plenty of water and avoid sweet beverages.   I have reviewed this encounter including the documentation in this note and/or discussed this patient with the Johney Maine, FNP, NP-C. I am certifying that I agree with the content of this note as supervising physician.  Steele Sizer, MD [facility] 07/20/2016, 8:16 PM

## 2016-07-08 NOTE — Patient Instructions (Addendum)
Preventive Care 18-39 Years, Female Preventive care refers to lifestyle choices and visits with your health care provider that can promote health and wellness. What does preventive care include?  A yearly physical exam. This is also called an annual well check.  Dental exams once or twice a year.  Routine eye exams. Ask your health care provider how often you should have your eyes checked.  Personal lifestyle choices, including:  Daily care of your teeth and gums.  Regular physical activity.  Eating a healthy diet.  Avoiding tobacco and drug use.  Limiting alcohol use.  Practicing safe sex.  Taking vitamin and mineral supplements as recommended by your health care provider. What happens during an annual well check? The services and screenings done by your health care provider during your annual well check will depend on your age, overall health, lifestyle risk factors, and family history of disease. Counseling  Your health care provider may ask you questions about your:  Alcohol use.  Tobacco use.  Drug use.  Emotional well-being.  Home and relationship well-being.  Sexual activity.  Eating habits.  Work and work environment.  Method of birth control.  Menstrual cycle.  Pregnancy history. Screening  You may have the following tests or measurements:  Height, weight, and BMI.  Diabetes screening. This is done by checking your blood sugar (glucose) after you have not eaten for a while (fasting).  Blood pressure.  Lipid and cholesterol levels. These may be checked every 5 years starting at age 20.  Skin check.  Hepatitis C blood test.  Hepatitis B blood test.  Sexually transmitted disease (STD) testing.  BRCA-related cancer screening. This may be done if you have a family history of breast, ovarian, tubal, or peritoneal cancers.  Pelvic exam and Pap test. This may be done every 3 years starting at age 21. Starting at age 30, this may be done every 5  years if you have a Pap test in combination with an HPV test. Discuss your test results, treatment options, and if necessary, the need for more tests with your health care provider. Vaccines  Your health care provider may recommend certain vaccines, such as:  Influenza vaccine. This is recommended every year.  Tetanus, diphtheria, and acellular pertussis (Tdap, Td) vaccine. You may need a Td booster every 10 years.  Varicella vaccine. You may need this if you have not been vaccinated.  HPV vaccine. If you are 26 or younger, you may need three doses over 6 months.  Measles, mumps, and rubella (MMR) vaccine. You may need at least one dose of MMR. You may also need a second dose.  Pneumococcal 13-valent conjugate (PCV13) vaccine. You may need this if you have certain conditions and were not previously vaccinated.  Pneumococcal polysaccharide (PPSV23) vaccine. You may need one or two doses if you smoke cigarettes or if you have certain conditions.  Meningococcal vaccine. One dose is recommended if you are age 19-21 years and a first-year college student living in a residence hall, or if you have one of several medical conditions. You may also need additional booster doses.  Hepatitis A vaccine. You may need this if you have certain conditions or if you travel or work in places where you may be exposed to hepatitis A.  Hepatitis B vaccine. You may need this if you have certain conditions or if you travel or work in places where you may be exposed to hepatitis B.  Haemophilus influenzae type b (Hib) vaccine. You may need this   if you have certain risk factors. Talk to your health care provider about which screenings and vaccines you need and how often you need them. This information is not intended to replace advice given to you by your health care provider. Make sure you discuss any questions you have with your health care provider. Document Released: 03/25/2001 Document Revised: 10/17/2015  Document Reviewed: 11/28/2014 Elsevier Interactive Patient Education  2017 [employer].

## 2016-07-09 ENCOUNTER — Encounter: Payer: Managed Care, Other (non HMO) | Admitting: Family Medicine

## 2016-07-10 LAB — CMP12+LP+TP+TSH+6AC+CBC/D/PLT
A/G RATIO: 1.4 (ref 1.2–2.2)
ALK PHOS: 71 IU/L (ref 39–117)
ALT: 26 IU/L (ref 0–32)
AST: 22 IU/L (ref 0–40)
Albumin: 4.5 g/dL (ref 3.5–5.5)
BASOS ABS: 0 10*3/uL (ref 0.0–0.2)
BASOS: 0 %
BILIRUBIN TOTAL: 0.3 mg/dL (ref 0.0–1.2)
BUN / CREAT RATIO: 15 (ref 9–23)
BUN: 12 mg/dL (ref 6–20)
CHLORIDE: 101 mmol/L (ref 96–106)
CREATININE: 0.82 mg/dL (ref 0.57–1.00)
Calcium: 9.7 mg/dL (ref 8.7–10.2)
Chol/HDL Ratio: 3.9 ratio (ref 0.0–4.4)
Cholesterol, Total: 165 mg/dL (ref 100–199)
EOS (ABSOLUTE): 0.1 10*3/uL (ref 0.0–0.4)
EOS: 1 %
Estimated CHD Risk: 0.8 times avg. (ref 0.0–1.0)
Free Thyroxine Index: 2 (ref 1.2–4.9)
GFR, EST AFRICAN AMERICAN: 106 mL/min/{1.73_m2} (ref >59)
GFR, EST NON AFRICAN AMERICAN: 92 mL/min/{1.73_m2} (ref >59)
GGT: 35 IU/L (ref 0–60)
GLUCOSE: 100 mg/dL — AB (ref 65–99)
Globulin, Total: 3.2 g/dL (ref 1.5–4.5)
HDL: 42 mg/dL (ref >39)
HEMATOCRIT: 38.1 % (ref 34.0–46.6)
HEMOGLOBIN: 12.9 g/dL (ref 11.1–15.9)
IMMATURE GRANULOCYTES: 0 %
IRON: 58 ug/dL (ref 27–159)
Immature Grans (Abs): 0 10*3/uL (ref 0.0–0.1)
LDH: 191 IU/L (ref 119–226)
LDL CALC: 103 mg/dL — AB (ref 0–99)
Lymphocytes Absolute: 2.8 10*3/uL (ref 0.7–3.1)
Lymphs: 38 %
MCH: 28.8 pg (ref 26.6–33.0)
MCHC: 33.9 g/dL (ref 31.5–35.7)
MCV: 85 fL (ref 79–97)
MONOCYTES: 5 %
Monocytes Absolute: 0.4 10*3/uL (ref 0.1–0.9)
NEUTROS PCT: 56 %
Neutrophils Absolute: 4 10*3/uL (ref 1.4–7.0)
PLATELETS: 302 10*3/uL (ref 150–379)
Phosphorus: 4.2 mg/dL (ref 2.5–4.5)
Potassium: 4 mmol/L (ref 3.5–5.2)
RBC: 4.48 x10E6/uL (ref 3.77–5.28)
RDW: 13.8 % (ref 12.3–15.4)
Sodium: 138 mmol/L (ref 134–144)
T3 UPTAKE RATIO: 25 % (ref 24–39)
T4, Total: 8.1 ug/dL (ref 4.5–12.0)
TOTAL PROTEIN: 7.7 g/dL (ref 6.0–8.5)
TSH: 2.02 u[IU]/mL (ref 0.450–4.500)
Triglycerides: 101 mg/dL (ref 0–149)
URIC ACID: 6.2 mg/dL (ref 2.5–7.1)
VLDL CHOLESTEROL CAL: 20 mg/dL (ref 5–40)
WBC: 7.3 10*3/uL (ref 3.4–10.8)

## 2016-07-10 LAB — IRON AND TIBC
Iron Saturation: 16 % (ref 15–55)
Total Iron Binding Capacity: 364 ug/dL (ref 250–450)
UIBC: 306 ug/dL (ref 131–425)

## 2016-07-10 LAB — VITAMIN B12: Vitamin B-12: 886 pg/mL (ref 232–1245)

## 2016-07-10 LAB — HGB A1C W/O EAG: Hgb A1c MFr Bld: 5.6 % (ref 4.8–5.6)

## 2016-07-11 LAB — PAP IG AND CT-NG NAA
Chlamydia Probe Amp: NOT DETECTED
GC Probe Amp: NOT DETECTED

## 2016-07-11 NOTE — Progress Notes (Signed)
Hello Ms. Dalia, I am writing to let you know that I have reviewed your lab results: Overall, your lab work looks excellent. I do want to point out a few items that I'd like for you to work on over the next few months:  Glucose - 100 and HgB A1C - 5.6: These numbers have slowly crept up over the last year or so, and I would like for you to try to avoid sweets and decrease your carbohydrate intake (Sugary foods, bread, pasta, rice, etc). You do not have diabetes or prediabetes, but I'd like for you to start eating healthy now so that we can avoid this!  LDL - 103: Your "Bad" cholesterol is just slightly elevated, and I want to encourage you to try to bring this down on your own without medication. Try to get 150 minutes of physical activity weekly, eat two servings of fish weekly, eat one serving of tree nuts ( cashews, pistachios, pecans, almonds.Marland Kitchen) every other day, eat 6 servings of fruit/vegetables daily and drink plenty of water.   I messaged a representative from the clinic, and they are checking on the Vitamin D level as this was not included in these results. I will let you know when I have that result.    Please feel free to message me back or call the clinic with any questions you may have.  Thank you so much, Raelyn Ensign NP-C

## 2016-07-11 NOTE — Progress Notes (Signed)
Can you check on the vitamin d and if it wasn't ordered can you add it on ?

## 2016-07-11 NOTE — Progress Notes (Signed)
We will check with [facility].  If it wasn't ordered we will add it on and send to you

## 2016-07-11 NOTE — Progress Notes (Signed)
Hello Anita Newton, I am writing to let you know that I have reviewed your lab results:  Your Pap, Gonorrhea, and Chlamydia testing were all negative.  We will not need to repeat your pap for 3 years.  Please feel free to message me back or call the clinic with any questions you may have.  Thank you so much, Raelyn Ensign NP-C

## 2016-07-16 LAB — SPECIMEN STATUS REPORT

## 2016-07-16 LAB — VITAMIN D 25 HYDROXY (VIT D DEFICIENCY, FRACTURES): VIT D 25 HYDROXY: 27.3 ng/mL — AB (ref 30.0–100.0)

## 2016-07-16 NOTE — Progress Notes (Signed)
Hello Ms. Marts,   Your Vitamin D leve was returned to me today, and you are just slightly low. If you could please continue your Vitamin D Supplement of 2000 units daily, this will help to increase and maintain your levels.  Today's Vitamin D level is higher than the last check which is great!  Please don't hesitate to call if you have questions or concerns.  Thank you, Raelyn Ensign NP-C

## 2016-07-22 ENCOUNTER — Telehealth: Payer: Self-pay | Admitting: Family Medicine

## 2016-07-22 DIAGNOSIS — Z87898 Personal history of other specified conditions: Secondary | ICD-10-CM

## 2016-07-22 NOTE — Telephone Encounter (Signed)
Pt is asking that you please change the order for her mammogram due to her 2007 visit. It need to be changed to diagnostic digital for the left side and the bilateral for the right and left. Pt went to screening today but they where not able to do it. Call [facility] if you have any questions.

## 2016-07-23 NOTE — Telephone Encounter (Signed)
Correct orders has been placed.

## 2016-07-23 NOTE — Telephone Encounter (Signed)
Sent to [facility]

## 2016-07-23 NOTE — Telephone Encounter (Signed)
Could you call [facility] and ask what codes they need and place these or let me know? Thank you!

## 2016-07-24 ENCOUNTER — Telehealth: Payer: Self-pay

## 2016-07-24 NOTE — Telephone Encounter (Signed)
Patient was informed via voicemail that she has been scheduled to have her mammogram on Friday, August 22, 2016 at 1:20pm at the [facility]. Patient was instructed to give their office call at [phone] to reschedule if she that day and time is an inconvenience.

## 2016-07-24 NOTE — Telephone Encounter (Signed)
Erroneous Entry  

## 2016-08-22 ENCOUNTER — Ambulatory Visit
Admission: RE | Admit: 2016-08-22 | Discharge: 2016-08-22 | Disposition: A | Discharge status: Home / Self Care | Payer: Managed Care, Other (non HMO) | Source: Ambulatory Visit | Attending: Family Medicine | Admitting: Family Medicine

## 2016-08-22 DIAGNOSIS — Z87898 Personal history of other specified conditions: Secondary | ICD-10-CM | POA: Insufficient documentation

## 2016-08-22 DIAGNOSIS — N6002 Solitary cyst of left breast: Secondary | ICD-10-CM | POA: Insufficient documentation

## 2016-08-22 IMAGING — MG MM DIGITAL DIAGNOSTIC BILAT W/ TOMO W/ CAD
8 of 13 series · 8 of 29 positions shown · non-contrast
Comparison: Images from [DATE]

CLINICAL DATA: 35-year-old patient presents for delayed follow-up
after [place] mamm[place] in [place] At that time, no
suspicious findings were seen, and a six-month follow-up was
recommended, but the patient was unable to return. She is
asymptomatic currently.

EXAM:
2D DIGITAL DIAGNOSTIC BILATERAL MAMMOGRAM WITH CAD AND ADJUNCT TOMO
ULTRASOUND LEFT BREAST

[R CC]
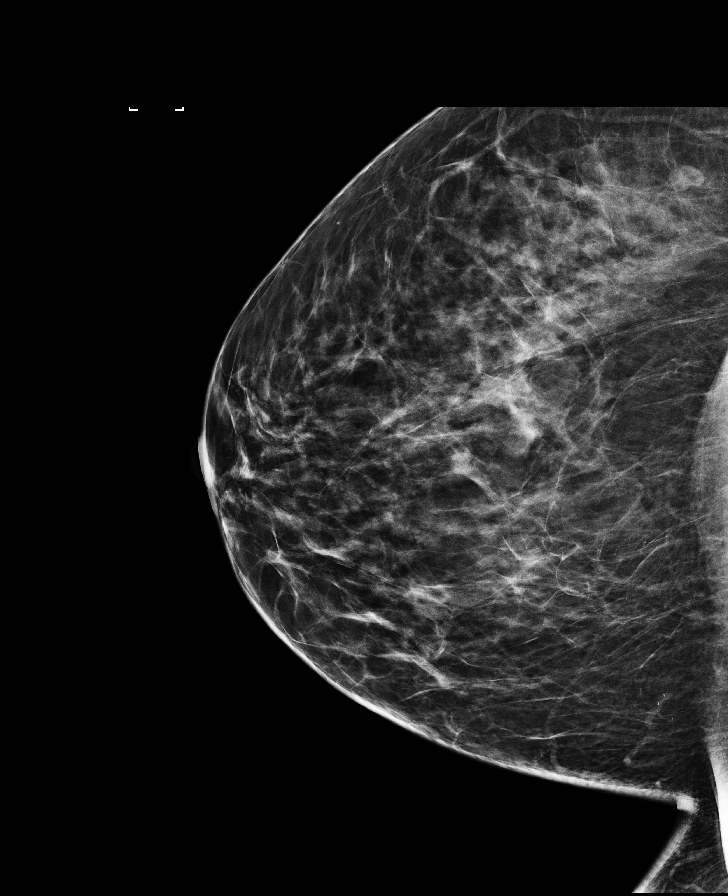

[L CC]
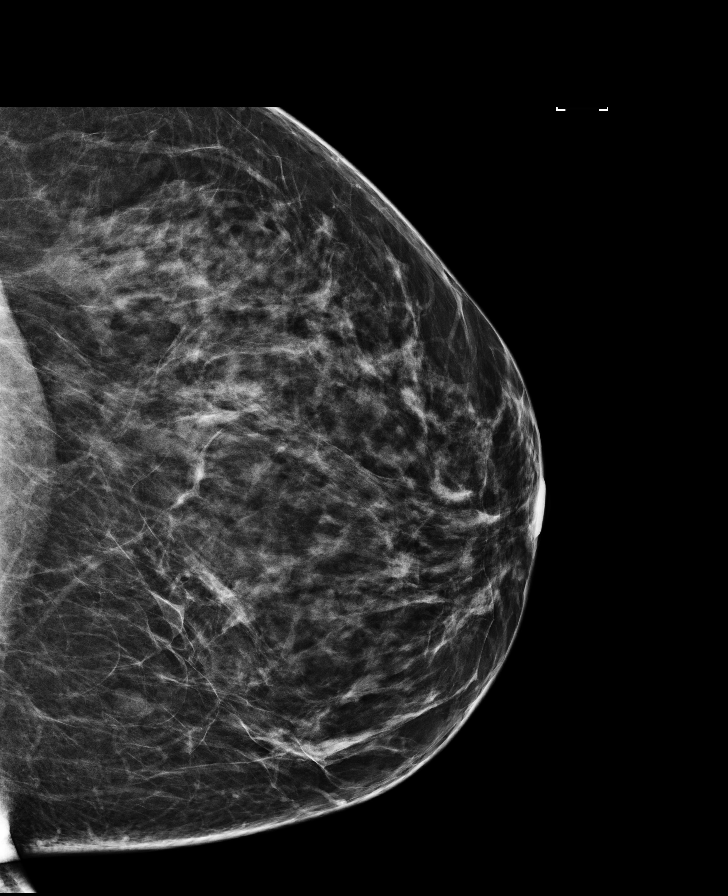

[L MLO]
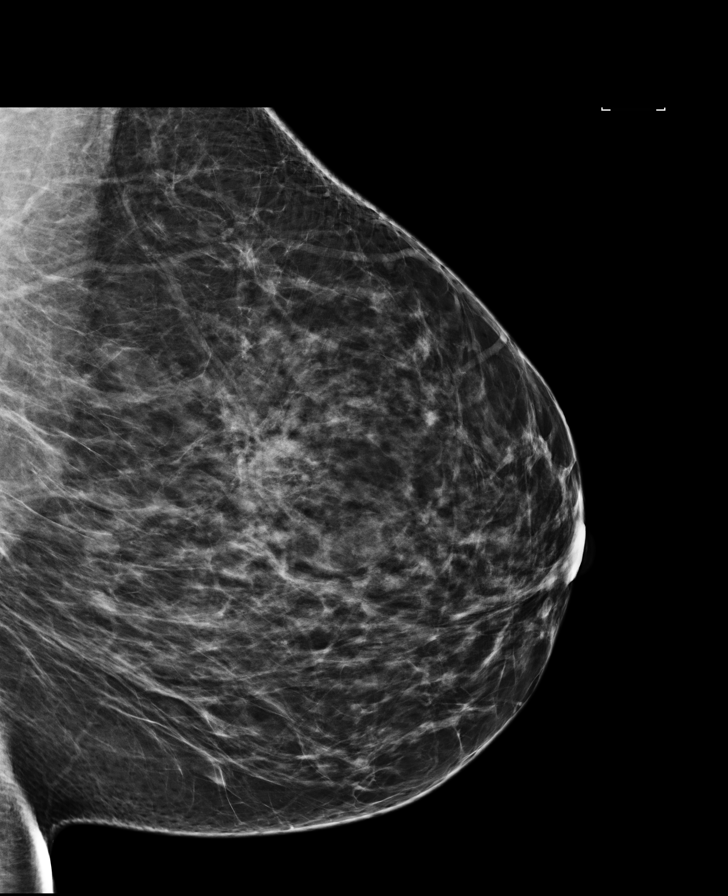

[L CC synth-2D]
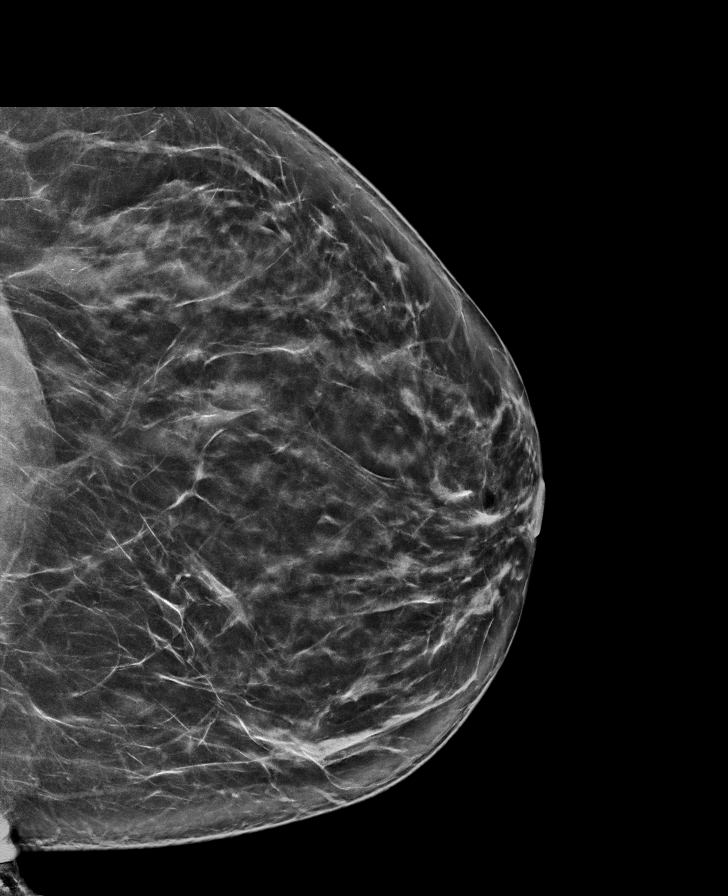

[R CC synth-2D]
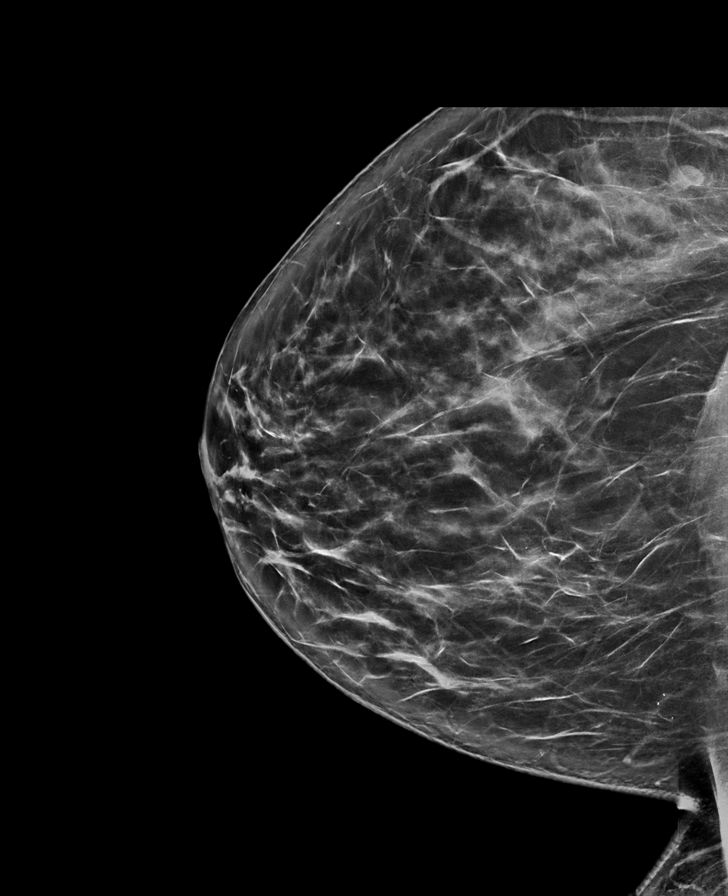

[L MLO synth-2D]
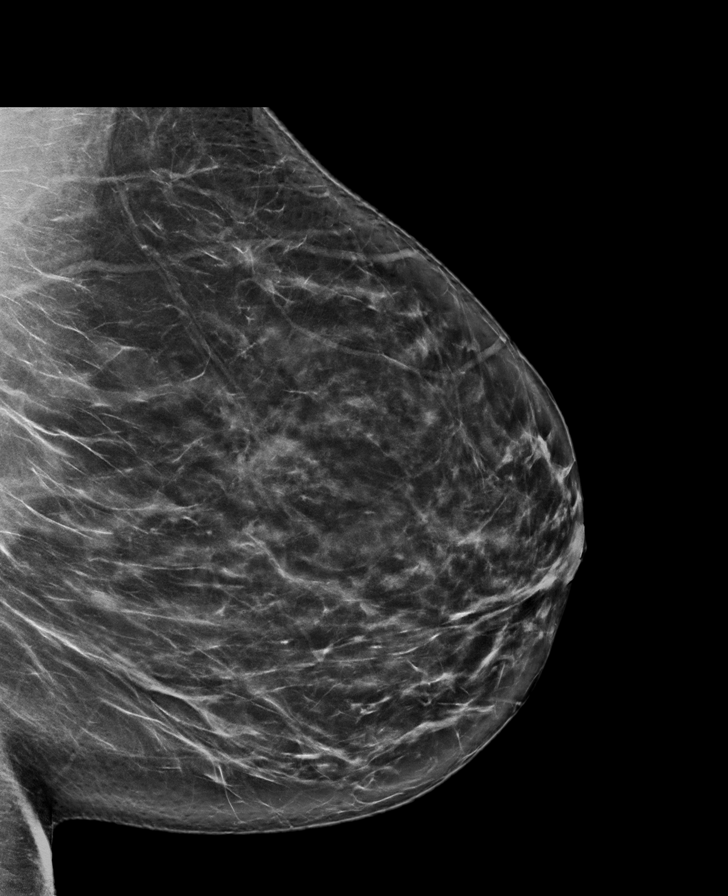

[R MLO synth-2D]
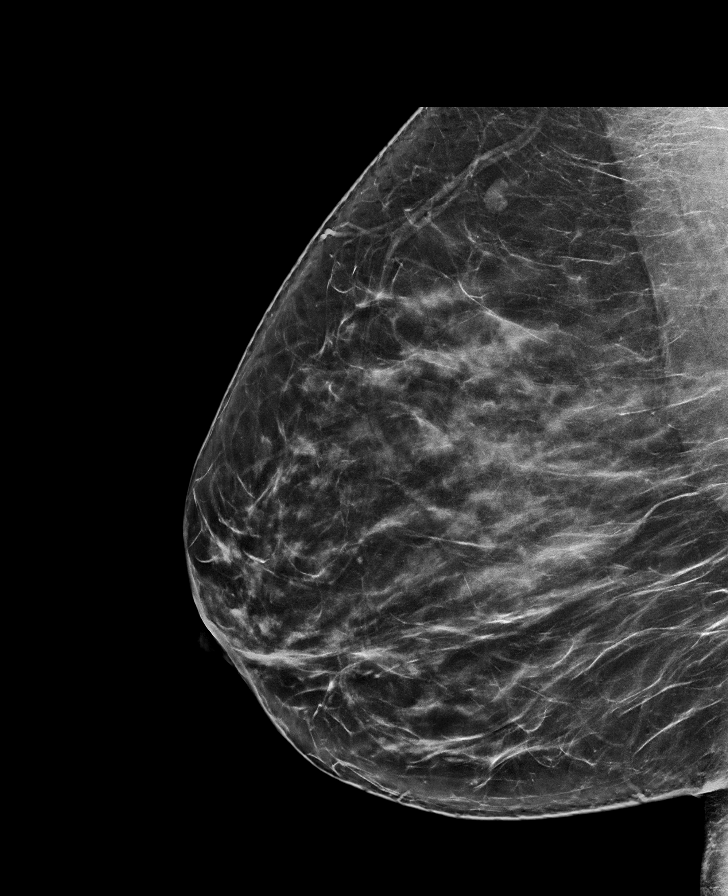

[R MLO]
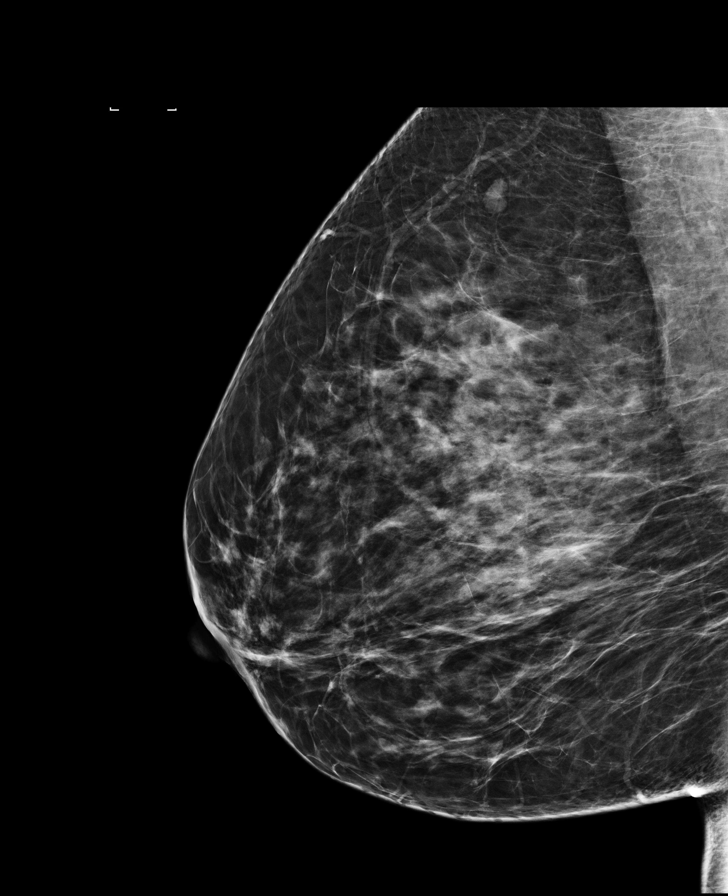

[8 of 29 positions shown; findings below may reference images not displayed]

ACR Breast Density Category c: The breast tissue is heterogeneously
dense, which may obscure small masses.
FINDINGS: There is a circumscribed oval mass in the deep upper inner left
breast. Otherwise, no suspicious mass, distortion, or suspicious
microcalcification is identified in either breast.

Mammographic images were processed with CAD.

Targeted ultrasound is performed, showing cluster of cysts at 9
o'clock position 7 cm from the nipple measuring 1.0 x 0.7 x 0.6 cm.
There is no associated vascular flow.
IMPRESSION: No evidence of malignancy in either breast. Benign cluster of cysts
in the 9 o'clock position the left breast.

RECOMMENDATION:
Screening mammogram at age 40 unless there are persistent or
intervening clinical concerns. (Code:[[place]])

I have discussed the findings and recommendations with the patient.
Results were also provided in writing at the conclusion of the
visit. If applicable, a reminder letter will be sent to the patient
regarding the next appointment.

BI-RADS CATEGORY  2: Benign.

## 2016-09-01 ENCOUNTER — Other Ambulatory Visit: Payer: Self-pay | Admitting: Family Medicine

## 2016-09-01 ENCOUNTER — Ambulatory Visit (INDEPENDENT_AMBULATORY_CARE_PROVIDER_SITE_OTHER): Payer: Managed Care, Other (non HMO)

## 2016-09-01 DIAGNOSIS — N920 Excessive and frequent menstruation with regular cycle: Secondary | ICD-10-CM

## 2016-09-01 DIAGNOSIS — Z3042 Encounter for surveillance of injectable contraceptive: Secondary | ICD-10-CM

## 2016-09-01 MED ORDER — MEDROXYPROGESTERONE ACETATE 150 MG/ML IM SUSP
150.0000 mg | Freq: Once | INTRAMUSCULAR | Status: AC
  Administered 2016-09-01: 150 mg via INTRAMUSCULAR

## 2016-09-01 NOTE — Telephone Encounter (Signed)
Patient requesting refill of Depo to [facility].

## 2016-10-20 ENCOUNTER — Other Ambulatory Visit: Payer: Self-pay | Admitting: Family Medicine

## 2016-10-20 DIAGNOSIS — E559 Vitamin D deficiency, unspecified: Secondary | ICD-10-CM

## 2016-10-20 NOTE — Telephone Encounter (Signed)
Patient requesting refill of Vitamin D to [facility].

## 2016-10-22 ENCOUNTER — Other Ambulatory Visit: Payer: Self-pay | Admitting: Family Medicine

## 2016-10-22 DIAGNOSIS — I10 Essential (primary) hypertension: Secondary | ICD-10-CM

## 2016-10-22 NOTE — Telephone Encounter (Signed)
Patient requesting refill of HCTZ to [facility].

## 2016-11-21 ENCOUNTER — Other Ambulatory Visit: Payer: Self-pay | Admitting: Family Medicine

## 2016-11-21 DIAGNOSIS — J302 Other seasonal allergic rhinitis: Secondary | ICD-10-CM

## 2016-11-21 NOTE — Telephone Encounter (Signed)
Refill request for general medication: Singulair   Last office visit: 07/08/2016-CPE and Medication Refill.  Next visit: 07/10/2017.

## 2016-12-03 ENCOUNTER — Ambulatory Visit (INDEPENDENT_AMBULATORY_CARE_PROVIDER_SITE_OTHER): Payer: Managed Care, Other (non HMO) | Admitting: Family Medicine

## 2016-12-03 DIAGNOSIS — Z3042 Encounter for surveillance of injectable contraceptive: Secondary | ICD-10-CM

## 2016-12-03 LAB — POCT URINE PREGNANCY: PREG TEST UR: NEGATIVE

## 2016-12-03 NOTE — Progress Notes (Signed)
Patient was here for 3 month Depo-Provera injection. She was 2 days overdue so urine pregnancy test was done and it was negative. She tolerated injection well.  She will follow up for next injection on Jan 9- Jan. 23.

## 2016-12-04 MED ORDER — MEDROXYPROGESTERONE ACETATE 150 MG/ML IM SUSP
150.0000 mg | Freq: Once | INTRAMUSCULAR | Status: AC
  Administered 2016-12-03: 150 mg via INTRAMUSCULAR

## 2016-12-29 ENCOUNTER — Other Ambulatory Visit: Payer: Self-pay | Admitting: Family Medicine

## 2016-12-29 DIAGNOSIS — I10 Essential (primary) hypertension: Secondary | ICD-10-CM

## 2017-01-19 ENCOUNTER — Other Ambulatory Visit: Payer: Self-pay | Admitting: Family Medicine

## 2017-01-19 DIAGNOSIS — E559 Vitamin D deficiency, unspecified: Secondary | ICD-10-CM

## 2017-03-06 ENCOUNTER — Ambulatory Visit: Payer: Managed Care, Other (non HMO)

## 2017-03-06 DIAGNOSIS — N920 Excessive and frequent menstruation with regular cycle: Secondary | ICD-10-CM | POA: Diagnosis not present

## 2017-03-06 MED ORDER — MEDROXYPROGESTERONE ACETATE 150 MG/ML IM SUSP
150.0000 mg | INTRAMUSCULAR | Status: AC
  Administered 2017-03-06 – 2018-05-21 (×5): 150 mg via INTRAMUSCULAR

## 2017-04-23 ENCOUNTER — Other Ambulatory Visit: Payer: Self-pay | Admitting: Family Medicine

## 2017-04-23 DIAGNOSIS — E559 Vitamin D deficiency, unspecified: Secondary | ICD-10-CM

## 2017-05-16 ENCOUNTER — Other Ambulatory Visit: Payer: Self-pay | Admitting: Family Medicine

## 2017-05-16 DIAGNOSIS — E559 Vitamin D deficiency, unspecified: Secondary | ICD-10-CM

## 2017-06-04 ENCOUNTER — Ambulatory Visit: Payer: Managed Care, Other (non HMO)

## 2017-06-04 DIAGNOSIS — N920 Excessive and frequent menstruation with regular cycle: Secondary | ICD-10-CM
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
## 2017-07-10 ENCOUNTER — Encounter: Payer: Self-pay | Admitting: Family Medicine

## 2017-07-10 ENCOUNTER — Other Ambulatory Visit: Payer: Self-pay

## 2017-07-10 ENCOUNTER — Ambulatory Visit (INDEPENDENT_AMBULATORY_CARE_PROVIDER_SITE_OTHER): Payer: Managed Care, Other (non HMO) | Admitting: Family Medicine

## 2017-07-10 VITALS — BP 120/90 | HR 66 | Temp 98.4°F | Resp 16 | Ht 61.0 in | Wt 205.1 lb

## 2017-07-10 DIAGNOSIS — J301 Allergic rhinitis due to pollen: Secondary | ICD-10-CM

## 2017-07-10 DIAGNOSIS — Z131 Encounter for screening for diabetes mellitus: Secondary | ICD-10-CM

## 2017-07-10 DIAGNOSIS — E559 Vitamin D deficiency, unspecified: Secondary | ICD-10-CM

## 2017-07-10 DIAGNOSIS — E669 Obesity, unspecified: Secondary | ICD-10-CM

## 2017-07-10 DIAGNOSIS — D509 Iron deficiency anemia, unspecified: Secondary | ICD-10-CM

## 2017-07-10 DIAGNOSIS — E538 Deficiency of other specified B group vitamins: Secondary | ICD-10-CM

## 2017-07-10 DIAGNOSIS — N920 Excessive and frequent menstruation with regular cycle: Secondary | ICD-10-CM

## 2017-07-10 DIAGNOSIS — Z1231 Encounter for screening mammogram for malignant neoplasm of breast: Secondary | ICD-10-CM

## 2017-07-10 DIAGNOSIS — Z01419 Encounter for gynecological examination (general) (routine) without abnormal findings: Secondary | ICD-10-CM

## 2017-07-10 DIAGNOSIS — Z124 Encounter for screening for malignant neoplasm of cervix: Secondary | ICD-10-CM

## 2017-07-10 DIAGNOSIS — Z13220 Encounter for screening for lipoid disorders: Secondary | ICD-10-CM

## 2017-07-10 DIAGNOSIS — I10 Essential (primary) hypertension: Secondary | ICD-10-CM

## 2017-07-10 DIAGNOSIS — Z3042 Encounter for surveillance of injectable contraceptive: Secondary | ICD-10-CM

## 2017-07-10 DIAGNOSIS — J302 Other seasonal allergic rhinitis: Secondary | ICD-10-CM

## 2017-07-10 DIAGNOSIS — Z113 Encounter for screening for infections with a predominantly sexual mode of transmission: Secondary | ICD-10-CM

## 2017-07-10 MED ORDER — VITAMIN D (ERGOCALCIFEROL) 1.25 MG (50000 UNIT) PO CAPS: 50000.0000 [IU] | ORAL_CAPSULE | ORAL | 1 refills | Status: DC

## 2017-07-10 MED ORDER — MEDROXYPROGESTERONE ACETATE 150 MG/ML IM SUSP: INTRAMUSCULAR | 3 refills | Status: DC

## 2017-07-10 MED ORDER — MONTELUKAST SODIUM 10 MG PO TABS: 10.0000 mg | ORAL_TABLET | Freq: Every day | ORAL | 1 refills | Status: AC

## 2017-07-10 MED ORDER — HYDROCHLOROTHIAZIDE 12.5 MG PO TABS: 12.5000 mg | ORAL_TABLET | Freq: Every day | ORAL | 1 refills | Status: DC

## 2017-07-10 NOTE — Addendum Note (Signed)
Addended by: Chilton Greathouse on: 07/10/2017 12:53 PM   Modules accepted: Orders

## 2017-07-10 NOTE — Progress Notes (Signed)
Name: Anita Newton   MRN: [id number]    DOB: 1980/10/29   Date:07/10/2017       Progress Note  Subjective  Chief Complaint  Chief Complaint  Patient presents with  . Annual Exam    HPI   Patient presents for annual CPE and follow up  HTN: she has been out of bp medication for the past month, no chest pain or headaches. We will recheck labs.   Obesity: she has gained a lot of weight since last visit, she states she is over eating and has not been to the gym lately, she will try a better diet and resume physical activity  History of iron deficiency anemia with irregular cycles: she went on depo to help with cycles and anemia, but she continues to bleed and spots frequently, discussed referral to gyn and she is willing to go, still passes clots at times.   Diet: discussed healthy diet Exercise: needs to resume  USPSTF grade A and B recommendations  Depression:  Depression screen [facility] 2/9 07/10/2017 07/08/2016 03/03/2016 11/29/2015 07/04/2015  Decreased Interest 0 0 0 0 0  Down, Depressed, Hopeless 1 0 0 0 0  PHQ - 2 Score 1 0 0 0 0  Altered sleeping 3 0 - - -  Tired, decreased energy 1 0 - - -  Change in appetite 3 0 - - -  Feeling bad or failure about yourself  0 0 - - -  Trouble concentrating 0 0 - - -  Moving slowly or fidgety/restless 0 0 - - -  Suicidal thoughts 0 0 - - -  PHQ-9 Score 8 0 - - -  Difficult doing work/chores Not difficult at all - - - -   Hypertension: BP Readings from Last 3 Encounters:  07/10/17 120/90  07/08/16 124/72  05/15/16 139/90   Obesity: Wt Readings from Last 3 Encounters:  07/10/17 205 lb 1.6 oz (93 kg)  07/08/16 191 lb 5 oz (86.8 kg)  03/03/16 191 lb 8 oz (86.9 kg)   BMI Readings from Last 3 Encounters:  07/10/17 38.75 kg/m  07/08/16 37.36 kg/m  03/03/16 37.40 kg/m     STD testing and prevention : today  Intimate partner violence: negative Sexual History/Pain during Intercourse: no pain, new sexual partner since 03/2017   Menstrual History/LMP/Abnormal Bleeding:spotting on depo, states worse when stressed  Incontinence Symptoms: none  Advanced Care Planning: A voluntary discussion about advance care planning including the explanation and discussion of advance directives as 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   Breast cancer: start age 38, had some done in the past because of breast mass BRCA gene screening: N/A Cervical cancer screening: new sexual partner needs repeat today    Lipids:  Lab Results  Component Value Date   CHOL 165 07/08/2016   CHOL 168 12/20/2014   CHOL 168 12/20/2014   Lab Results  Component Value Date   HDL 42 07/08/2016   HDL 55 12/20/2014   HDL 55 12/20/2014   Lab Results  Component Value Date   LDLCALC 103 (H) 07/08/2016   LDLCALC 100 (H) 12/20/2014   LDLCALC 100 12/20/2014   Lab Results  Component Value Date   TRIG 101 07/08/2016   TRIG 63 12/20/2014   TRIG 63 12/20/2014   Lab Results  Component Value  Date   CHOLHDL 3.9 07/08/2016   CHOLHDL 3.1 12/20/2014   No results found for: LDLDIRECT  Glucose:  Glucose  Date Value Ref Range Status  07/08/2016 100 (H) 65 - 99 mg/dL Final  12/20/2014 85 65 - 99 mg/dL Final   Glucose, Bld  Date Value Ref Range Status  01/28/2015 94 65 - 99 mg/dL Final   [id number]  Patient Active Problem List   Diagnosis Date Noted  . B12 deficiency 07/08/2016  . Iron deficiency anemia 07/08/2016  . Seasonal allergic rhinitis 05/16/2015  . History of eclampsia 12/15/2014  . Hypertension, benign 12/15/2014  . Large breasts 12/15/2014  . Obesity (BMI 35.0-39.9 without comorbidity) 12/15/2014  . Thoracic back pain 12/15/2014  . Vitamin D deficiency 12/15/2014    Past Surgical History:  Procedure Laterality Date  . EYE SURGERY    . TUBAL LIGATION      Family History   Problem Relation Age of Onset  . Cancer Mother        eye tumor  . Diabetes Mother   . Lupus Mother   . Alcohol abuse Father   . Drug abuse Father   . Eczema Daughter   . ADD / ADHD Daughter   . ADD / ADHD Son   . Allergic Disorder Son   . Lupus Sister   . Depression Sister   . Hypertension Brother     Social History   Socioeconomic History  . Marital status: Single    Spouse name: Not on file  . Number of children: 3  . Years of education: Not on file  . Highest education level: Some college, no degree  Occupational History  . Not on file  Social Needs  . Financial resource strain: Somewhat hard  . Food insecurity:    Worry: Never true    Inability: Never true  . Transportation needs:    Medical: No    Non-medical: No  Tobacco Use  . Smoking status: Never Smoker  . Smokeless tobacco: Never Used  Substance and Sexual Activity  . Alcohol use: No    Alcohol/week: 0.0 oz  . Drug use: No  . Sexual activity: Yes    Birth control/protection: Condom, Injection  Lifestyle  . Physical activity:    Days per week: 0 days    Minutes per session: 0 min  . Stress: To some extent  Relationships  . Social connections:    Talks on phone: More than three times a week    Gets together: More than three times a week    Attends religious service: Never    Active member of club or organization: No    Attends meetings of clubs or organizations: Never    Relationship status: Divorced  . Intimate partner violence:    Fear of current or ex partner: No    Emotionally abused: No    Physically abused: No    Forced sexual activity: No  Other Topics Concern  . Not on file  Social History Narrative  . Not on file     Current Outpatient Medications:  .  ferrous sulfate 325 (65 FE) MG tablet, Take 1 tablet (325 mg total) by mouth 2 (two) times daily with a meal., Disp: 60 tablet, Rfl: 2 .  fluticasone (FLONASE) 50 MCG/ACT nasal spray, Place 2 sprays into both nostrils daily.,  Disp: 16 g, Rfl: 6 .  hydrochlorothiazide (HYDRODIURIL) 12.5 MG tablet, take 1 tablet by mouth once daily, Disp: 30 tablet, Rfl: 1 .  ibuprofen (ADVIL,MOTRIN) 600 MG tablet, Take by mouth., Disp: , Rfl:  .  loratadine (CLARITIN) 10 MG tablet, Take 1 tablet (10 mg total) by mouth daily as needed for allergies., Disp: 30 tablet, Rfl: 11 .  medroxyPROGESTERone (DEPO-PROVERA) 150 MG/ML injection, use as directed at 57 office every 3 months, Disp: 1 mL, Rfl: 3 .  montelukast (SINGULAIR) 10 MG tablet, take 1 tablet by mouth at bedtime, Disp: 30 tablet, Rfl: 3 .  Multiple Vitamin (MULTIVITAMIN) tablet, Take 1 tablet by mouth daily., Disp: , Rfl:  .  Omega-3 Fatty Acids (FISH OIL) 1000 MG CAPS, Take 1 capsule by mouth once a week., Disp: , Rfl:  .  vitamin C (ASCORBIC ACID) 500 MG tablet, Take 500 mg by mouth daily. Reported on 02/01/2015, Disp: , Rfl:  .  Vitamin D, Ergocalciferol, (DRISDOL) 50000 units CAPS capsule, TAKE ONE CAPSULE BY MOUTH EVERY WEEK, Disp: 4 capsule, Rfl: 0  Current Facility-Administered Medications:  .  medroxyPROGESTERone (DEPO-PROVERA) injection 150 mg, 150 mg, Intramuscular, Q90 days, Steele Sizer, MD, 150 mg at 06/04/17 0854  Allergies  Allergen Reactions  . Cherry Hives and Swelling     ROS   Constitutional: Negative for fever or weight change.  Respiratory: Negative for cough and shortness of breath.   Cardiovascular: Negative for chest pain or palpitations.  Gastrointestinal: Negative for abdominal pain, no bowel changes.  Musculoskeletal: Negative for gait problem or joint swelling.  Skin: Negative for rash.  Neurological: Negative for dizziness or headache.  No other specific complaints in a complete review of systems (except as listed in HPI above).   Objective  Vitals:   07/10/17 0835  BP: 120/90  Pulse: 66  Resp: 16  Temp: 98.4 F (36.9 C)  TempSrc: Oral  SpO2: 98%  Weight: 205 lb 1.6 oz (93 kg)  Height: _0  (1.549 m)    Body  mass index is 38.75 kg/m.  Physical Exam  Constitutional: Patient appears well-developed and obese . No distress.  HENT: Head: Normocephalic and atraumatic. Ears: B TMs ok, no erythema or effusion; Nose: Nose normal. Mouth/Throat: Oropharynx is clear and moist. No oropharyngeal exudate.  Eyes: Conjunctivae and EOM are normal. Pupils are equal, round, and reactive to light. No scleral icterus.  Neck: Normal range of motion. Neck supple. No JVD present. No thyromegaly present.  Cardiovascular: Normal rate, regular rhythm and normal heart sounds.  No murmur heard. No BLE edema. Pulmonary/Chest: Effort normal and breath sounds normal. No respiratory distress. Abdominal: Soft. Bowel sounds are normal, no distension. There is no tenderness. no masses Breast: no lumps or masses, no nipple discharge or rashes FEMALE GENITALIA:  External genitalia normal External urethra normal Vaginal vault normal without discharge or lesions Cervix normal without discharge or lesions Bimanual exam normal without masses RECTAL: not done Musculoskeletal: Normal range of motion, no joint effusions. No gross deformities Neurological: he is alert and oriented to person, place, and time. No cranial nerve deficit. Coordination, balance, strength, speech and gait are normal.  Skin: Skin is warm and dry. No rash noted. No erythema.  Psychiatric: Patient has a normal mood and affect. behavior is normal. Judgment and thought content normal.     PHQ2/9: Depression screen [facility] 2/9 07/10/2017 07/08/2016 03/03/2016 11/29/2015 07/04/2015  Decreased Interest 0 0 0 0 0  Down, Depressed, Hopeless 1 0 0 0 0  PHQ - 2 Score 1 0 0 0 0  Altered sleeping 3 0 - - -  Tired, decreased energy 1 0 - - -  Change in appetite 3 0 - - -  Feeling bad or failure about yourself  0 0 - - -  Trouble concentrating 0 0 - - -  Moving slowly or fidgety/restless 0 0 - - -  Suicidal thoughts 0 0 - - -  PHQ-9 Score 8 0 - - -  Difficult doing  work/chores Not difficult at all - - - -     Fall Risk: Fall Risk  07/10/2017 03/03/2016 11/29/2015 07/04/2015 05/16/2015  Falls in the past year? _0      Functional Status Survey: Is the patient deaf or have difficulty hearing?: No Does the patient have difficulty seeing, even when wearing glasses/contacts?: No Does the patient have difficulty concentrating, remembering, or making decisions?: No Does the patient have difficulty walking or climbing stairs?: No Does the patient have difficulty dressing or bathing?: No Does the patient have difficulty doing errands alone such as visiting a doctor's office or shopping?: No   Assessment & Plan  1. Well woman exam with routine gynecological exam  Discussed importance of 150 minutes of physical activity weekly, eat two servings of fish weekly, eat one serving of tree nuts ( cashews, pistachios, pecans, almonds.Marland Kitchen) every other day, eat 6 servings of fruit/vegetables daily and drink plenty of water and avoid sweet beverages.    2. Encounter for surveillance of injectable contraceptive  Continue Depo  3. Vitamin D deficiency  - VITAMIN D 25 Hydroxy (Vit-D Deficiency, Fractures) - Vitamin D, Ergocalciferol, (DRISDOL) 50000 units CAPS capsule; Take 1 capsule (50,000 Units total) by mouth once a week.  Dispense: 12 capsule; Refill: 1  4. Obesity (BMI 35.0-39.9 without comorbidity)  Discussed with the patient the risk posed by an increased BMI. Discussed importance of portion control, calorie counting and at least 150 minutes of physical activity weekly. Avoid sweet beverages and drink more water. Eat at least 6 servings of fruit and vegetables daily   5. B12 deficiency  - Vitamin B12  6. Hypertension, benign  - COMPLETE METABOLIC PANEL WITH GFR - CBC with Differential/Platelet - hydrochlorothiazide (HYDRODIURIL) 12.5 MG tablet; Take 1 tablet (12.5 mg total) by mouth daily.  Dispense: 90 tablet; Refill: 1  7. Breast cancer  screening by mammogram  Back to regular follow ups, starting age 73   8. Diabetes mellitus screening  - Hemoglobin A1c  9. Routine screening for STI (sexually transmitted infection)  - HIV antibody - RPR - Pap IG, CT/NG NAA, and HPV (high risk) - Hepatitis, Acute  10. Lipid screening  - Lipid panel  11. Iron deficiency anemia, unspecified iron deficiency anemia type  - CBC with Differential/Platelet - Iron, TIBC and Ferritin Panel; Future  12. Menorrhagia with regular cycle  - medroxyPROGESTERone (DEPO-PROVERA) 150 MG/ML injection; use as directed at physician's office every 3 months  Dispense: 1 mL; Refill: 3 -referral gyn   13. Seasonal allergic rhinitis  - montelukast (SINGULAIR) 10 MG tablet; Take 1 tablet (10 mg total) by mouth at bedtime.  Dispense: 90 tablet; Refill: 1

## 2017-07-10 NOTE — Addendum Note (Signed)
Addended by: Steele Sizer F on: 07/10/2017 12:57 PM   Modules accepted: Orders

## 2017-07-10 NOTE — Patient Instructions (Signed)
Preventive Care 18-39 Years, Female Preventive care refers to lifestyle choices and visits with your health care provider that can promote health and wellness. What does preventive care include?  A yearly physical exam. This is also called an annual well check.  Dental exams once or twice a year.  Routine eye exams. Ask your health care provider how often you should have your eyes checked.  Personal lifestyle choices, including: ? Daily care of your teeth and gums. ? Regular physical activity. ? Eating a healthy diet. ? Avoiding tobacco and drug use. ? Limiting alcohol use. ? Practicing safe sex. ? Taking vitamin and mineral supplements as recommended by your health care provider. What happens during an annual well check? The services and screenings done by your health care provider during your annual well check will depend on your age, overall health, lifestyle risk factors, and family history of disease. Counseling Your health care provider may ask you questions about your:  Alcohol use.  Tobacco use.  Drug use.  Emotional well-being.  Home and relationship well-being.  Sexual activity.  Eating habits.  Work and work Statistician.  Method of birth control.  Menstrual cycle.  Pregnancy history.  Screening You may have the following tests or measurements:  Height, weight, and BMI.  Diabetes screening. This is done by checking your blood sugar (glucose) after you have not eaten for a while (fasting).  Blood pressure.  Lipid and cholesterol levels. These may be checked every 5 years starting at age 66.  Skin check.  Hepatitis C blood test.  Hepatitis B blood test.  Sexually transmitted disease (STD) testing.  BRCA-related cancer screening. This may be done if you have a family history of breast, ovarian, tubal, or peritoneal cancers.  Pelvic exam and Pap test. This may be done every 3 years starting at age 40. Starting at age 59, this may be done every 5  years if you have a Pap test in combination with an HPV test.  Discuss your test results, treatment options, and if necessary, the need for more tests with your health care provider. Vaccines Your health care provider may recommend certain vaccines, such as:  Influenza vaccine. This is recommended every year.  Tetanus, diphtheria, and acellular pertussis (Tdap, Td) vaccine. You may need a Td booster every 10 years.  Varicella vaccine. You may need this if you have not been vaccinated.  HPV vaccine. If you are 69 or younger, you may need three doses over 6 months.  Measles, mumps, and rubella (MMR) vaccine. You may need at least one dose of MMR. You may also need a second dose.  Pneumococcal 13-valent conjugate (PCV13) vaccine. You may need this if you have certain conditions and were not previously vaccinated.  Pneumococcal polysaccharide (PPSV23) vaccine. You may need one or two doses if you smoke cigarettes or if you have certain conditions.  Meningococcal vaccine. One dose is recommended if you are age 27-21 years and a first-year college student living in a residence hall, or if you have one of several medical conditions. You may also need additional booster doses.  Hepatitis A vaccine. You may need this if you have certain conditions or if you travel or work in places where you may be exposed to hepatitis A.  Hepatitis B vaccine. You may need this if you have certain conditions or if you travel or work in places where you may be exposed to hepatitis B.  Haemophilus influenzae type b (Hib) vaccine. You may need this if  you have certain risk factors.  Talk to your health care provider about which screenings and vaccines you need and how often you need them. This information is not intended to replace advice given to you by your health care provider. Make sure you discuss any questions you have with your health care provider. Document Released: 03/25/2001 Document Revised: 10/17/2015  Document Reviewed: 11/28/2014 Elsevier Interactive Patient Education  [employer].

## 2017-07-13 LAB — PAP IG AND HPV HIGH-RISK: HPV DNA High Risk: NOT DETECTED

## 2017-07-14 LAB — CBC WITH DIFFERENTIAL/PLATELET
BASOS ABS: 0 10*3/uL (ref 0.0–0.2)
Basos: 0 %
EOS (ABSOLUTE): 0.1 10*3/uL (ref 0.0–0.4)
Eos: 2 %
Hematocrit: 36.6 % (ref 34.0–46.6)
Hemoglobin: 12.3 g/dL (ref 11.1–15.9)
Immature Grans (Abs): 0 10*3/uL (ref 0.0–0.1)
Immature Granulocytes: 0 %
LYMPHS ABS: 2.6 10*3/uL (ref 0.7–3.1)
Lymphs: 45 %
MCH: 28.7 pg (ref 26.6–33.0)
MCHC: 33.6 g/dL (ref 31.5–35.7)
MCV: 85 fL (ref 79–97)
MONOS ABS: 0.4 10*3/uL (ref 0.1–0.9)
Monocytes: 7 %
Neutrophils Absolute: 2.7 10*3/uL (ref 1.4–7.0)
Neutrophils: 46 %
PLATELETS: 348 10*3/uL (ref 150–450)
RBC: 4.29 x10E6/uL (ref 3.77–5.28)
RDW: 14 % (ref 12.3–15.4)
WBC: 5.9 10*3/uL (ref 3.4–10.8)

## 2017-07-14 LAB — COMPREHENSIVE METABOLIC PANEL
ALBUMIN: 4.3 g/dL (ref 3.5–5.5)
ALK PHOS: 78 IU/L (ref 39–117)
ALT: 26 IU/L (ref 0–32)
AST: 22 IU/L (ref 0–40)
Albumin/Globulin Ratio: 1.5 (ref 1.2–2.2)
BILIRUBIN TOTAL: 0.3 mg/dL (ref 0.0–1.2)
BUN / CREAT RATIO: 11 (ref 9–23)
BUN: 11 mg/dL (ref 6–20)
CHLORIDE: 106 mmol/L (ref 96–106)
CO2: 19 mmol/L — ABNORMAL LOW (ref 20–29)
Calcium: 9.4 mg/dL (ref 8.7–10.2)
Creatinine, Ser: 1.04 mg/dL — ABNORMAL HIGH (ref 0.57–1.00)
GFR calc Af Amer: 79 mL/min/{1.73_m2} (ref >59)
GFR calc non Af Amer: 69 mL/min/{1.73_m2} (ref >59)
GLOBULIN, TOTAL: 2.9 g/dL (ref 1.5–4.5)
GLUCOSE: 91 mg/dL (ref 65–99)
Potassium: 4.6 mmol/L (ref 3.5–5.2)
SODIUM: 141 mmol/L (ref 134–144)
Total Protein: 7.2 g/dL (ref 6.0–8.5)

## 2017-07-14 LAB — LIPID PANEL
Chol/HDL Ratio: 3.4 ratio (ref 0.0–4.4)
Cholesterol, Total: 160 mg/dL (ref 100–199)
HDL: 47 mg/dL (ref >39)
LDL Calculated: 103 mg/dL — ABNORMAL HIGH (ref 0–99)
Triglycerides: 48 mg/dL (ref 0–149)
VLDL CHOLESTEROL CAL: 10 mg/dL (ref 5–40)

## 2017-07-14 LAB — HEPATITIS PANEL, ACUTE
HEP B S AG: NEGATIVE
Hep A IgM: NEGATIVE
Hep B C IgM: NEGATIVE
Hep C Virus Ab: <0.1 s/co ratio (ref 0.0–0.9)

## 2017-07-14 LAB — IRON,TIBC AND FERRITIN PANEL
FERRITIN: 104 ng/mL (ref 15–150)
Iron Saturation: 15 % (ref 15–55)
Iron: 50 ug/dL (ref 27–159)
Total Iron Binding Capacity: 341 ug/dL (ref 250–450)
UIBC: 291 ug/dL (ref 131–425)

## 2017-07-14 LAB — VITAMIN D 25 HYDROXY (VIT D DEFICIENCY, FRACTURES): Vit D, 25-Hydroxy: 32.5 ng/mL (ref 30.0–100.0)

## 2017-07-14 LAB — HIV ANTIBODY (ROUTINE TESTING W REFLEX): HIV SCREEN 4TH GENERATION: NONREACTIVE

## 2017-07-14 LAB — RPR QUALITATIVE: RPR: NONREACTIVE

## 2017-07-14 LAB — VITAMIN B12: Vitamin B-12: 477 pg/mL (ref 232–1245)

## 2017-07-14 LAB — HGB A1C W/O EAG: HEMOGLOBIN A1C: 5.6 % (ref 4.8–5.6)

## 2017-07-16 ENCOUNTER — Ambulatory Visit (INDEPENDENT_AMBULATORY_CARE_PROVIDER_SITE_OTHER): Payer: Managed Care, Other (non HMO) | Admitting: Obstetrics and Gynecology

## 2017-07-16 ENCOUNTER — Encounter: Payer: Self-pay | Admitting: Obstetrics and Gynecology

## 2017-07-16 VITALS — BP 140/96 | HR 94 | Ht 60.0 in | Wt 204.5 lb

## 2017-07-16 DIAGNOSIS — N938 Other specified abnormal uterine and vaginal bleeding: Secondary | ICD-10-CM | POA: Insufficient documentation

## 2017-07-16 DIAGNOSIS — N921 Excessive and frequent menstruation with irregular cycle: Secondary | ICD-10-CM

## 2017-07-16 NOTE — Patient Instructions (Signed)
I value your feedback and entrusting us with your care. If you get a [facility] patient survey, I would appreciate you taking the time to let us know about your experience today. Thank you! 
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
## 2017-07-17 LAB — PROLACTIN: PROLACTIN: 44.7 ng/mL — AB (ref 4.8–23.3)

## 2017-07-17 LAB — TSH+FREE T4
Free T4: 1.38 ng/dL (ref 0.82–1.77)
TSH: 3.24 u[IU]/mL (ref 0.450–4.500)

## 2017-07-20 ENCOUNTER — Telehealth: Payer: Self-pay | Admitting: Obstetrics and Gynecology

## 2017-07-20 DIAGNOSIS — R899 Unspecified abnormal finding in specimens from other organs, systems and tissues: Secondary | ICD-10-CM

## 2017-07-20 NOTE — Telephone Encounter (Signed)
Pt aware of labs. TSH WNL, prolactin slightly elevated. Pt was fasting. Rechk fasting prolactin again close to 10:00. Has GYN u/s appt 08/21/17 at 9:00, so will do labs afterwards.

## 2017-07-21 ENCOUNTER — Telehealth: Payer: Self-pay

## 2017-07-21 NOTE — Telephone Encounter (Signed)
Found form and filled out to fax to [facility] for patient. Called and left voicemail for patient to notify her.

## 2017-07-21 NOTE — Telephone Encounter (Signed)
Can we write a note statin that she had a CPE on 07/10/2017?

## 2017-07-21 NOTE — Telephone Encounter (Signed)
Copied from [place] [id number]. Topic: General - Other >> Jul 21, 2017 12:36 PM Cecelia Byars, NT wrote: Reason for CRM: Patient called  to follow up on a form  she left on 07/10/17 that was left on the the date of her visit to be faxed to her insurance ,the fax number is  the form ,please call her at [phone] with any questions , thanks   >> Jul 21, 2017  1:01 PM Synthia Innocent wrote: With form insurance is not accepting points where pt had CPE and illness visit at the same time. Needs to be resubmit. Please advise

## 2017-07-22 ENCOUNTER — Encounter: Payer: Self-pay | Admitting: Obstetrics and Gynecology

## 2017-07-22 ENCOUNTER — Ambulatory Visit (INDEPENDENT_AMBULATORY_CARE_PROVIDER_SITE_OTHER): Payer: Managed Care, Other (non HMO)

## 2017-07-22 ENCOUNTER — Ambulatory Visit (INDEPENDENT_AMBULATORY_CARE_PROVIDER_SITE_OTHER): Payer: Managed Care, Other (non HMO) | Admitting: Obstetrics and Gynecology

## 2017-07-22 VITALS — BP 150/90 | HR 90 | Ht 60.0 in | Wt 204.0 lb

## 2017-07-22 DIAGNOSIS — N921 Excessive and frequent menstruation with irregular cycle: Secondary | ICD-10-CM | POA: Diagnosis not present

## 2017-07-22 DIAGNOSIS — I10 Essential (primary) hypertension: Secondary | ICD-10-CM

## 2017-07-22 DIAGNOSIS — N938 Other specified abnormal uterine and vaginal bleeding: Secondary | ICD-10-CM

## 2017-07-22 DIAGNOSIS — R899 Unspecified abnormal finding in specimens from other organs, systems and tissues: Secondary | ICD-10-CM | POA: Diagnosis not present

## 2017-07-22 MED ORDER — ESTRADIOL 1 MG PO TABS: 1.0000 mg | ORAL_TABLET | Freq: Every day | ORAL | 0 refills | Status: DC

## 2017-07-22 NOTE — Patient Instructions (Signed)
I value your feedback and entrusting us with your care. If you get a [facility] patient survey, I would appreciate you taking the time to let us know about your experience today. Thank you! 
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
## 2017-07-23 LAB — PROLACTIN: Prolactin: 7.8 ng/mL (ref 4.8–23.3)

## 2017-07-31 NOTE — Telephone Encounter (Signed)
Pt. Called the insurance called and they received the biometric form but they need to have a corrected coding for CPE separate form the illness.  and then the illness would need to be sent separate.    Please call patient on this.

## 2017-08-03 ENCOUNTER — Telehealth: Payer: Self-pay

## 2017-08-03 NOTE — Telephone Encounter (Signed)
Sent billing issue over to our claims department to further assist the patient and they will contact patient.

## 2017-08-03 NOTE — Telephone Encounter (Signed)
lmtrc

## 2017-08-03 NOTE — Telephone Encounter (Signed)
Estradiol is not meant to be long term medication. Was used to stop bleeding. RN to see if she is still bleeding vs no more bleeding.

## 2017-08-03 NOTE — Telephone Encounter (Signed)
Pt states estradiol is working and would like to continue it.  Please send in refill.  [phone]

## 2017-08-04 ENCOUNTER — Other Ambulatory Visit: Payer: Self-pay | Admitting: Obstetrics and Gynecology

## 2017-08-04 DIAGNOSIS — N921 Excessive and frequent menstruation with irregular cycle: Secondary | ICD-10-CM

## 2017-08-04 NOTE — Telephone Encounter (Signed)
Please advise 

## 2017-08-04 NOTE — Telephone Encounter (Signed)
Pt called back and stated she was still having some bleeding

## 2017-08-04 NOTE — Telephone Encounter (Signed)
Pls see telephone note from yesterday. JP LMTRC. Pls notify pt of info from yesterday. Thx.

## 2017-08-04 NOTE — Telephone Encounter (Signed)
Spoke with pt. On estradiol 1 mg daily for 14 days (today is last pill). BTB stopped about 1 wk ago. Pt very happy. Aware it's not long term med and no need to continue. F/u if sx recur. Also has possible tiny polyp. May need D&C if BTB cont.

## 2017-08-05 NOTE — Telephone Encounter (Signed)
Pt spoke with ABC yesterday

## 2017-08-14 ENCOUNTER — Encounter: Payer: Self-pay | Admitting: Obstetrics and Gynecology

## 2017-09-03 ENCOUNTER — Telehealth: Payer: Self-pay

## 2017-09-03 ENCOUNTER — Ambulatory Visit: Payer: Managed Care, Other (non HMO)

## 2017-09-03 DIAGNOSIS — N920 Excessive and frequent menstruation with regular cycle: Secondary | ICD-10-CM

## 2017-09-03 DIAGNOSIS — N938 Other specified abnormal uterine and vaginal bleeding: Secondary | ICD-10-CM

## 2017-09-03 DIAGNOSIS — J189 Pneumonia, unspecified organism: Secondary | ICD-10-CM

## 2017-09-03 NOTE — Telephone Encounter (Signed)
Last gyn visit was done by [facility], last rx of Diflucan was 2016 by Ashok Cordia. Your symptoms could be from BV or yeast. You need to call them back. You may need to be seen

## 2017-09-03 NOTE — Telephone Encounter (Signed)
Left message for patient on voicemail to call GYN or [facility] Cordia since last prescription was in 2016 .

## 2017-09-03 NOTE — Telephone Encounter (Signed)
Patient states every summer she will get BV due to the heat and having to wear a uniform. She is having yellowish discharge, itchiness and Dr. Ancil Boozer will give her Diflucan to help symptoms. Symptoms have been going on for 1 week. Could you please send in treatment to [facility] in [place].

## 2017-09-14 ENCOUNTER — Telehealth: Payer: Self-pay | Admitting: Family Medicine

## 2017-09-14 NOTE — Telephone Encounter (Signed)
erroneous

## 2017-09-29 ENCOUNTER — Ambulatory Visit: Payer: Managed Care, Other (non HMO) | Admitting: Obstetrics and Gynecology

## 2017-10-02 ENCOUNTER — Telehealth: Payer: Self-pay | Admitting: Obstetrics and Gynecology

## 2017-10-02 ENCOUNTER — Encounter: Payer: Self-pay | Admitting: Obstetrics and Gynecology

## 2017-10-02 ENCOUNTER — Ambulatory Visit: Payer: Managed Care, Other (non HMO) | Admitting: Obstetrics and Gynecology

## 2017-10-02 VITALS — BP 118/82 | Ht 60.0 in | Wt 204.0 lb

## 2017-10-02 DIAGNOSIS — Z6839 Body mass index (BMI) 39.0-39.9, adult: Secondary | ICD-10-CM

## 2017-10-02 DIAGNOSIS — N938 Other specified abnormal uterine and vaginal bleeding: Secondary | ICD-10-CM | POA: Diagnosis not present

## 2017-10-02 DIAGNOSIS — D251 Intramural leiomyoma of uterus: Secondary | ICD-10-CM | POA: Diagnosis not present

## 2017-10-02 DIAGNOSIS — N8003 Adenomyosis of the uterus: Secondary | ICD-10-CM

## 2017-10-02 DIAGNOSIS — N840 Polyp of corpus uteri: Secondary | ICD-10-CM | POA: Diagnosis not present

## 2017-10-02 DIAGNOSIS — N800 Endometriosis of uterus: Secondary | ICD-10-CM

## 2017-10-02 DIAGNOSIS — E669 Obesity, unspecified: Secondary | ICD-10-CM

## 2017-10-02 DIAGNOSIS — N809 Endometriosis, unspecified: Secondary | ICD-10-CM

## 2017-10-02 NOTE — Telephone Encounter (Signed)
Patient will watch for Pre-admit Testing phone interview notification on MyChart. Patient is aware of OR on 10/06/17. Patient is aware she may receive calls from the [facility]. Ext given.

## 2017-10-02 NOTE — Telephone Encounter (Signed)
Patient is aware of OR on 10/06/17 @ 2:30pm, and that she should arrive at [facility] [employer], 1st office on right) at 12:30pm. Patient is aware no food after midnight, and water is okay until 10:30am. Patient is aware (per [facility] @ Pre-admit Testing) she needs to continue the hydrochlorothiazide but do not take morning of surgery, continue iron but do not take morning of surgery, stop all supplements, stop ibuprofren and NSAIDS, claritin and flonase okay day of surgery if needed, and Vitamin D okay except day of surgery (patient confirmed she takes Vit D on Sunday). Patient is aware I will not be here next week, and that if she has a question on Monday to call the nurse line.

## 2017-10-02 NOTE — Telephone Encounter (Signed)
-----   Message from Homero Fellers, MD sent at 10/02/2017  9:36 AM EDT ----- Regarding: surgery Surgery Booking Request Patient Full Name:  Anita Newton  MRN: [id number]  DOB: 10-09-1980  Surgeon: Homero Fellers, MD  Requested Surgery Date and Time: ASAP Primary Diagnosis AND Code: abnormal uterine bleeding Secondary Diagnosis and Code: Uterine fibroid, uterine polyp Surgical Procedure: Hysteroscopy D&C possible myosure possible  polypectomy L&D Notification: No Admission Status: same day surgery Length of Surgery: 1 hour Special Case Needs: none H&P:   (date) Phone Interview???: ok Interpreter: Language:  Medical Clearance: no Special Scheduling Instructions: consents signed 10/02/17
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
## 2017-10-06 ENCOUNTER — Encounter: Payer: Self-pay | Admitting: *Deleted

## 2017-10-06 ENCOUNTER — Encounter
Admission: RE | Disposition: A | Discharge status: Home / Self Care | Payer: Self-pay | Source: Ambulatory Visit | Attending: Obstetrics and Gynecology

## 2017-10-06 ENCOUNTER — Ambulatory Visit: Payer: Managed Care, Other (non HMO) | Admitting: Anesthesiology

## 2017-10-06 ENCOUNTER — Other Ambulatory Visit: Payer: Self-pay

## 2017-10-06 ENCOUNTER — Ambulatory Visit
Admission: RE | Admit: 2017-10-06 | Discharge: 2017-10-06 | Disposition: A | Discharge status: Home / Self Care | Payer: Managed Care, Other (non HMO) | Source: Ambulatory Visit | Attending: Obstetrics and Gynecology | Admitting: Obstetrics and Gynecology

## 2017-10-06 DIAGNOSIS — Z6839 Body mass index (BMI) 39.0-39.9, adult: Secondary | ICD-10-CM | POA: Insufficient documentation

## 2017-10-06 DIAGNOSIS — N840 Polyp of corpus uteri: Secondary | ICD-10-CM | POA: Diagnosis not present

## 2017-10-06 DIAGNOSIS — E669 Obesity, unspecified: Secondary | ICD-10-CM | POA: Insufficient documentation

## 2017-10-06 DIAGNOSIS — E559 Vitamin D deficiency, unspecified: Secondary | ICD-10-CM | POA: Insufficient documentation

## 2017-10-06 DIAGNOSIS — F329 Major depressive disorder, single episode, unspecified: Secondary | ICD-10-CM | POA: Diagnosis not present

## 2017-10-06 DIAGNOSIS — I10 Essential (primary) hypertension: Secondary | ICD-10-CM | POA: Insufficient documentation

## 2017-10-06 DIAGNOSIS — N938 Other specified abnormal uterine and vaginal bleeding: Secondary | ICD-10-CM | POA: Diagnosis not present

## 2017-10-06 DIAGNOSIS — N72 Inflammatory disease of cervix uteri: Secondary | ICD-10-CM | POA: Diagnosis not present

## 2017-10-06 DIAGNOSIS — J45909 Unspecified asthma, uncomplicated: Secondary | ICD-10-CM | POA: Diagnosis not present

## 2017-10-06 DIAGNOSIS — Z79899 Other long term (current) drug therapy: Secondary | ICD-10-CM | POA: Diagnosis not present

## 2017-10-06 DIAGNOSIS — F419 Anxiety disorder, unspecified: Secondary | ICD-10-CM | POA: Insufficient documentation

## 2017-10-06 DIAGNOSIS — N939 Abnormal uterine and vaginal bleeding, unspecified: Secondary | ICD-10-CM | POA: Insufficient documentation

## 2017-10-06 HISTORY — PX: DILATATION & CURETTAGE/HYSTEROSCOPY WITH MYOSURE: SHX6511

## 2017-10-06 LAB — CBC
HEMATOCRIT: 37.9 % (ref 35.0–47.0)
HEMOGLOBIN: 13.1 g/dL (ref 12.0–16.0)
MCH: 29.8 pg (ref 26.0–34.0)
MCHC: 34.5 g/dL (ref 32.0–36.0)
MCV: 86.3 fL (ref 80.0–100.0)
Platelets: 341 10*3/uL (ref 150–440)
RBC: 4.38 MIL/uL (ref 3.80–5.20)
RDW: 13.2 % (ref 11.5–14.5)
WBC: 6.2 10*3/uL (ref 3.6–11.0)

## 2017-10-06 LAB — POCT PREGNANCY, URINE: PREG TEST UR: NEGATIVE

## 2017-10-06 SURGERY — DILATATION & CURETTAGE/HYSTEROSCOPY WITH MYOSURE
Anesthesia: General

## 2017-10-06 MED ORDER — MIDAZOLAM HCL 2 MG/2ML IJ SOLN
INTRAMUSCULAR | Status: AC
  Filled 2017-10-06: qty 2

## 2017-10-06 MED ORDER — SILVER NITRATE-POT NITRATE 75-25 % EX MISC
CUTANEOUS | Status: AC
  Filled 2017-10-06: qty 1

## 2017-10-06 MED ORDER — FENTANYL CITRATE (PF) 100 MCG/2ML IJ SOLN
25.0000 ug | INTRAMUSCULAR | Status: DC | PRN
  Administered 2017-10-06 (×4): 25 ug via INTRAVENOUS

## 2017-10-06 MED ORDER — DEXAMETHASONE SODIUM PHOSPHATE 10 MG/ML IJ SOLN
INTRAMUSCULAR | Status: DC | PRN
  Administered 2017-10-06: 10 mg via INTRAVENOUS

## 2017-10-06 MED ORDER — MIDAZOLAM HCL 2 MG/2ML IJ SOLN
INTRAMUSCULAR | Status: DC | PRN
  Administered 2017-10-06: 2 mg via INTRAVENOUS

## 2017-10-06 MED ORDER — FENTANYL CITRATE (PF) 100 MCG/2ML IJ SOLN
INTRAMUSCULAR | Status: AC
  Filled 2017-10-06: qty 2

## 2017-10-06 MED ORDER — GLYCOPYRROLATE 0.2 MG/ML IJ SOLN
INTRAMUSCULAR | Status: DC | PRN
  Administered 2017-10-06: .2 mg via INTRAVENOUS

## 2017-10-06 MED ORDER — OXYCODONE HCL 5 MG PO TABS: 5.0000 mg | ORAL_TABLET | Freq: Once | ORAL | Status: DC | PRN

## 2017-10-06 MED ORDER — IBUPROFEN 600 MG PO TABS: 600.0000 mg | ORAL_TABLET | Freq: Four times a day (QID) | ORAL | 0 refills | Status: DC | PRN

## 2017-10-06 MED ORDER — ACETAMINOPHEN 500 MG PO TABS: 1000.0000 mg | ORAL_TABLET | Freq: Four times a day (QID) | ORAL | 0 refills | Status: DC | PRN

## 2017-10-06 MED ORDER — LIDOCAINE HCL (CARDIAC) PF 100 MG/5ML IV SOSY
PREFILLED_SYRINGE | INTRAVENOUS | Status: DC | PRN
  Administered 2017-10-06: 80 mg via INTRAVENOUS

## 2017-10-06 MED ORDER — OXYCODONE HCL 5 MG/5ML PO SOLN: 5.0000 mg | Freq: Once | ORAL | Status: DC | PRN

## 2017-10-06 MED ORDER — LACTATED RINGERS IV SOLN
INTRAVENOUS | Status: DC
  Administered 2017-10-06 (×2): via INTRAVENOUS

## 2017-10-06 MED ORDER — PROPOFOL 10 MG/ML IV BOLUS
INTRAVENOUS | Status: DC | PRN
  Administered 2017-10-06: 200 mg via INTRAVENOUS

## 2017-10-06 MED ORDER — PROPOFOL 10 MG/ML IV BOLUS
INTRAVENOUS | Status: AC
  Filled 2017-10-06: qty 20

## 2017-10-06 MED ORDER — FENTANYL CITRATE (PF) 100 MCG/2ML IJ SOLN
INTRAMUSCULAR | Status: AC
  Administered 2017-10-06: 25 ug via INTRAVENOUS
  Filled 2017-10-06: qty 2

## 2017-10-06 MED ORDER — FENTANYL CITRATE (PF) 100 MCG/2ML IJ SOLN
INTRAMUSCULAR | Status: DC | PRN
  Administered 2017-10-06: 25 ug via INTRAVENOUS
  Administered 2017-10-06: 50 ug via INTRAVENOUS

## 2017-10-06 MED ORDER — FAMOTIDINE 20 MG PO TABS
ORAL_TABLET | ORAL | Status: AC
  Administered 2017-10-06: 14:00:00
  Filled 2017-10-06: qty 1

## 2017-10-06 MED ORDER — PHENYLEPHRINE HCL 10 MG/ML IJ SOLN
INTRAMUSCULAR | Status: DC | PRN
  Administered 2017-10-06 (×3): 100 ug via INTRAVENOUS

## 2017-10-06 SURGICAL SUPPLY — 23 items
BAG COUNTER SPONGE EZ (MISCELLANEOUS) ×2 IMPLANT
BAG SPNG 4X4 CLR HAZ (MISCELLANEOUS) ×1
CANISTER SUC SOCK COL 7IN (MISCELLANEOUS) ×2 IMPLANT
CATH ROBINSON RED A/P 16FR (CATHETERS) ×2 IMPLANT
DEVICE MYOSURE LITE (MISCELLANEOUS) IMPLANT
DEVICE MYOSURE REACH (MISCELLANEOUS) IMPLANT
ELECT REM PT RETURN 9FT ADLT (ELECTROSURGICAL) ×2
ELECTRODE REM PT RTRN 9FT ADLT (ELECTROSURGICAL) ×1 IMPLANT
GLOVE BIO SURGEON STRL SZ 6.5 (GLOVE) ×2 IMPLANT
GLOVE BIOGEL PI IND STRL 6.5 (GLOVE) ×1 IMPLANT
GLOVE BIOGEL PI INDICATOR 6.5 (GLOVE) ×1
GOWN STRL REUS W/ TWL LRG LVL3 (GOWN DISPOSABLE) ×2 IMPLANT
GOWN STRL REUS W/TWL LRG LVL3 (GOWN DISPOSABLE) ×4
PACK DNC HYST (MISCELLANEOUS) ×2 IMPLANT
PAD OB MATERNITY 4.3X12.25 (PERSONAL CARE ITEMS) ×2 IMPLANT
PAD PREP 24X41 OB/GYN DISP (PERSONAL CARE ITEMS) ×2 IMPLANT
SOL .9 NS 3000ML IRR  AL (IV SOLUTION) ×1
SOL .9 NS 3000ML IRR AL (IV SOLUTION) ×1
SOL .9 NS 3000ML IRR UROMATIC (IV SOLUTION) ×1 IMPLANT
STRAP SAFETY 5IN WIDE (MISCELLANEOUS) ×2 IMPLANT
TOWEL OR 17X26 4PK STRL BLUE (TOWEL DISPOSABLE) ×2 IMPLANT
TUBING CONNECTING 10 (TUBING) ×2 IMPLANT
TUBING HYSTEROSCOPY DOLPHIN (MISCELLANEOUS) ×2 IMPLANT

## 2017-10-06 NOTE — Op Note (Signed)
Operative Note  10/06/2017  PRE-OP DIAGNOSIS: Abnormal uterine bleeding, Endometrial polyp  POST-OP DIAGNOSIS: Abnormal uterine bleeding  SURGEON: Adrian Prows MD  PROCEDURE: Procedure(s): DILATATION & CURETTAGE /HYSTEROSCOPY   ANESTHESIA: Choice   ESTIMATED BLOOD LOSS: 25 cc   SPECIMENS:  Endometrial curettage  FLUID DEFICIT: none  COMPLICATIONS: None  DISPOSITION: PACU - hemodynamically stable.  CONDITION: stable  FINDINGS: Exam under anesthesia revealed small, mobile 11 uterus with no masses and bilateral adnexa without masses or fullness. Hysteroscopy revealed a  grossly normal appearing uterine cavity with bilateral tubal ostia and normal appearing endocervical canal. There was no evidence of a fibroid or polyp inside the uterine cavity. Uterus bulky and enlarged on bimanual exam.   PROCEDURE IN DETAIL: After informed consent was obtained, the patient was taken to the operating room where anesthesia was obtained without difficulty. The patient was positioned in the dorsal lithotomy position in [place]. The patient's bladder was catheterized with an in and out foley catheter. The patient was examined under anesthesia, with the above noted findings. The weighted speculum was placed inside the patient's vagina, and the the anterior lip of the cervix was seen and grasped with the tenaculum. The uterine cavity was sounded to 10cm, and then the cervix was progressively dilated to a 16 French-Pratt dilator. The 0 degree hysteroscope was introduced, with saline fluid used to distend the intrauterine cavity, with the above noted findings.  The hystersocope was removed and the uterine cavity was curetted until a gritty texture was noted, yielding endometrial curettings. Excellent hemostasis was noted, and all instruments were removed, with excellent hemostasis noted throughout. She was then taken out of dorsal lithotomy. Minimal discrepancy in fluid was noted.  The patient  tolerated the procedure well. Sponge, lap and needle counts were correct x2. The patient was taken to recovery room in excellent condition.  Adrian Prows MD [facility], [facility] 10/06/17 5:47 PM

## 2017-10-06 NOTE — Discharge Instructions (Signed)
Hysteroscopy, Care After Refer to this sheet in the next few weeks. These instructions provide you with information on caring for yourself after your procedure. Your health care provider may also give you more specific instructions. Your treatment has been planned according to current medical practices, but problems sometimes occur. Call your health care provider if you have any problems or questions after your procedure. What can I expect after the procedure? After your procedure, it is typical to have the following:  You may have some cramping. This normally lasts for a couple days.  You may have bleeding. This can vary from light spotting for a few days to menstrual-like bleeding for 3-7 days.  Follow these instructions at home:  Rest for the first 1-2 days after the procedure.  Only take over-the-counter or prescription medicines as directed by your health care provider. Do not take aspirin. It can increase the chances of bleeding.  Take showers instead of baths for 2 weeks or as directed by your health care provider.  Do not drive for 24 hours or as directed.  Do not drink alcohol while taking pain medicine.  Do not use tampons, douche, or have sexual intercourse for 2 weeks or until your health care provider says it is okay.  Take your temperature twice a day for 4-5 days. Write it down each time.  Follow your health care provider's advice about diet, exercise, and lifting.  If you develop constipation, you may: ? Take a mild laxative if your health care provider approves. ? Add bran foods to your diet. ? Drink enough fluids to keep your urine clear or pale yellow.  Try to have someone with you or available to you for the first 24-48 hours, especially if you were given a general anesthetic.  Follow up with your health care provider as directed. Contact a health care provider if:  You feel dizzy or lightheaded.  You feel sick to your stomach (nauseous).  You have  abnormal vaginal discharge.  You have a rash.  You have pain that is not controlled with medicine. Get help right away if:  You have bleeding that is heavier than a normal menstrual period.  You have a fever.  You have increasing cramps or pain, not controlled with medicine.  You have new belly (abdominal) pain.  You pass out.  You have pain in the tops of your shoulders (shoulder strap areas).  You have shortness of breath. This information is not intended to replace advice given to you by your health care provider. Make sure you discuss any questions you have with your health care provider. Document Released: 11/17/2012 Document Revised: 07/05/2015 Document Reviewed: 08/26/2012 Elsevier Interactive Patient Education  2017 [place]   1) The drugs that you were given will stay in your system until tomorrow so for the next 24 hours you should not:  A) Drive an automobile B) Make any legal decisions C) Drink any alcoholic beverage   2) You may resume regular meals tomorrow.  Today it is better to start with liquids and gradually work up to solid foods.  You may eat anything you prefer, but it is better to start with liquids, then soup and crackers, and gradually work up to solid foods.   3) Please notify your doctor immediately if you have any unusual bleeding, trouble breathing, redness and pain at the surgery site, drainage, fever, or pain not relieved by medication.    4) Additional Instructions:  Please contact your physician with any problems or Same Day Surgery at [phone], Monday through Friday 6 am to 4 pm, or [place] [facility] [place] Main number at [phone]. ° °

## 2017-10-06 NOTE — Interval H&P Note (Signed)
History and Physical Interval Note:  10/06/2017 4:43 PM  Anita Newton  has presented today for surgery, with the diagnosis of ABNORMAL UTERINE BLEEDING,UTERINE FIBROID,UTERINE POLYP  The various methods of treatment have been discussed with the patient and family. After consideration of risks, benefits and other options for treatment, the patient has consented to  Procedure(s): DILATATION & CURETTAGE/HYSTEROSCOPY WITH POSSIBLE MYOSURE,POSSIBLE POLYPECTOMY (N/A) as a surgical intervention .  The patient's history has been reviewed, patient examined, no change in status, stable for surgery.  I have reviewed the patient's chart and labs.  Questions were answered to the patient's satisfaction.     [name]

## 2017-10-06 NOTE — Anesthesia Procedure Notes (Signed)
Procedure Name: LMA Insertion Date/Time: 10/06/2017 5:13 PM Performed by: Allean Found, CRNA Pre-anesthesia Checklist: Patient identified, Patient being monitored, Timeout performed, Emergency Drugs available and Suction available Patient Re-evaluated:Patient Re-evaluated prior to induction Oxygen Delivery Method: Circle system utilized Preoxygenation: Pre-oxygenation with 100% oxygen Induction Type: IV induction Ventilation: Mask ventilation without difficulty LMA: LMA inserted LMA Size: 4.0 Grade View: Grade I Tube type: Oral Number of attempts: 1 Placement Confirmation: positive ETCO2 and breath sounds checked- equal and bilateral Tube secured with: Tape Dental Injury: Teeth and Oropharynx as per pre-operative assessment

## 2017-10-06 NOTE — Anesthesia Post-op Follow-up Note (Signed)
Anesthesia QCDR form completed.        
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
## 2017-10-06 NOTE — Transfer of Care (Signed)
Immediate Anesthesia Transfer of Care Note  Patient: Anita Newton  Procedure(s) Performed: DILATATION & CURETTAGE /HYSTEROSCOPY (N/A )  Patient Location: PACU  Anesthesia Type:General  Level of Consciousness: awake, alert  and oriented  Airway & Oxygen Therapy: Patient Spontanous Breathing and Patient connected to face mask oxygen  Post-op Assessment: Report given to RN and Post -op Vital signs reviewed and stable  Post vital signs: Reviewed and stable  Last Vitals:  Vitals Value Taken Time  BP 125/82 10/06/2017  5:59 PM  Temp 36.7 C 10/06/2017  5:59 PM  Pulse 113 10/06/2017  6:08 PM  Resp 16 10/06/2017  6:08 PM  SpO2 100 % 10/06/2017  6:08 PM  Vitals shown include unvalidated device data.  Last Pain:  Vitals:   10/06/17 1759  TempSrc:   PainSc: Asleep         Complications: No apparent anesthesia complications

## 2017-10-07 ENCOUNTER — Encounter: Payer: Self-pay | Admitting: Obstetrics and Gynecology

## 2017-10-07 LAB — TYPE AND SCREEN
ABO/RH(D): A POS
ANTIBODY SCREEN: NEGATIVE

## 2017-10-07 NOTE — Anesthesia Postprocedure Evaluation (Signed)
Anesthesia Post Note  Patient: Anita Newton  Procedure(s) Performed: DILATATION & CURETTAGE /HYSTEROSCOPY (N/A )  Patient location during evaluation: PACU Anesthesia Type: General Level of consciousness: awake and alert Pain management: pain level controlled Vital Signs Assessment: post-procedure vital signs reviewed and stable Respiratory status: spontaneous breathing, nonlabored ventilation, respiratory function stable and patient connected to nasal cannula oxygen Cardiovascular status: blood pressure returned to baseline and stable Postop Assessment: no apparent nausea or vomiting Anesthetic complications: no     Last Vitals:  Vitals:   10/06/17 1929 10/06/17 1939  BP: (!) 132/99   Pulse: 96 100  Resp: 18 18  Temp:  36.7 C  SpO2: 97% 98%    Last Pain:  Vitals:   10/06/17 1939  TempSrc:   PainSc: 0-No pain                 Precious Haws [place]

## 2017-10-09 LAB — SURGICAL PATHOLOGY

## 2017-10-14 ENCOUNTER — Other Ambulatory Visit: Payer: Self-pay | Admitting: Obstetrics and Gynecology

## 2017-10-14 DIAGNOSIS — N921 Excessive and frequent menstruation with irregular cycle: Secondary | ICD-10-CM

## 2017-10-14 MED ORDER — NORETHIN ACE-ETH ESTRAD-FE 1-20 MG-MCG(24) PO CAPS: 1.0000 | ORAL_CAPSULE | Freq: Every day | ORAL | 0 refills | Status: DC

## 2017-10-14 NOTE — Progress Notes (Signed)
Discussed with patient on phone, thin lining on examination, will try one month of OCP in addition to Depo provera.

## 2017-10-18 ENCOUNTER — Other Ambulatory Visit: Payer: Self-pay | Admitting: Obstetrics and Gynecology

## 2017-11-04 ENCOUNTER — Other Ambulatory Visit: Payer: Self-pay | Admitting: Obstetrics and Gynecology

## 2017-11-04 DIAGNOSIS — N921 Excessive and frequent menstruation with irregular cycle: Secondary | ICD-10-CM

## 2017-11-08 ENCOUNTER — Other Ambulatory Visit: Payer: Self-pay

## 2017-11-08 DIAGNOSIS — N921 Excessive and frequent menstruation with irregular cycle: Secondary | ICD-10-CM

## 2017-11-09 ENCOUNTER — Other Ambulatory Visit: Payer: Self-pay

## 2017-11-09 DIAGNOSIS — N921 Excessive and frequent menstruation with irregular cycle: Secondary | ICD-10-CM

## 2017-11-09 MED ORDER — NORETHIN ACE-ETH ESTRAD-FE 1-20 MG-MCG(24) PO CAPS: 1.0000 | ORAL_CAPSULE | Freq: Every day | ORAL | 0 refills | Status: DC

## 2017-11-09 NOTE — Telephone Encounter (Signed)
Please advise 

## 2017-11-25 ENCOUNTER — Ambulatory Visit (INDEPENDENT_AMBULATORY_CARE_PROVIDER_SITE_OTHER): Payer: Managed Care, Other (non HMO)

## 2017-11-25 DIAGNOSIS — Z3042 Encounter for surveillance of injectable contraceptive: Secondary | ICD-10-CM | POA: Diagnosis not present

## 2017-11-25 MED ORDER — MEDROXYPROGESTERONE ACETATE 150 MG/ML IM SUSP
150.0000 mg | Freq: Once | INTRAMUSCULAR | Status: AC
  Administered 2017-11-25: 150 mg via INTRAMUSCULAR

## 2017-12-06 ENCOUNTER — Other Ambulatory Visit: Payer: Self-pay

## 2017-12-06 DIAGNOSIS — N921 Excessive and frequent menstruation with irregular cycle: Secondary | ICD-10-CM

## 2017-12-07 MED ORDER — NORETHIN ACE-ETH ESTRAD-FE 1-20 MG-MCG(24) PO CAPS: 1.0000 | ORAL_CAPSULE | Freq: Every day | ORAL | 0 refills | Status: DC

## 2017-12-20 ENCOUNTER — Other Ambulatory Visit: Payer: Self-pay | Admitting: Family Medicine

## 2017-12-20 DIAGNOSIS — E559 Vitamin D deficiency, unspecified: Secondary | ICD-10-CM

## 2018-01-05 ENCOUNTER — Other Ambulatory Visit: Payer: Self-pay | Admitting: Obstetrics and Gynecology

## 2018-01-05 DIAGNOSIS — N921 Excessive and frequent menstruation with irregular cycle: Secondary | ICD-10-CM

## 2018-01-05 NOTE — Telephone Encounter (Signed)
Deferring to your mgmt plan.

## 2018-01-05 NOTE — Telephone Encounter (Signed)
Can I refill these for her BTB? Please advise.

## 2018-01-07 MED ORDER — NORETHIN ACE-ETH ESTRAD-FE 1-20 MG-MCG(24) PO CAPS: 1.0000 | ORAL_CAPSULE | Freq: Every day | ORAL | 0 refills | Status: DC

## 2018-01-26 ENCOUNTER — Other Ambulatory Visit: Payer: Self-pay | Admitting: Family Medicine

## 2018-01-26 ENCOUNTER — Encounter: Payer: Self-pay | Admitting: Family Medicine

## 2018-01-26 DIAGNOSIS — I10 Essential (primary) hypertension: Secondary | ICD-10-CM

## 2018-01-26 DIAGNOSIS — J301 Allergic rhinitis due to pollen: Secondary | ICD-10-CM

## 2018-01-26 NOTE — Telephone Encounter (Signed)
Hypertension medication request:  HCTZ and Singulair   Last office visit pertaining to hypertension: 07/10/2017   BP Readings from Last 3 Encounters:  10/06/17 (!) 132/99  10/02/17 118/82  07/22/17 (!) 150/90    Lab Results  Component Value Date   CREATININE 1.04 (H) 07/10/2017   BUN 11 07/10/2017   NA 141 07/10/2017   K 4.6 07/10/2017   CL 106 07/10/2017   CO2 19 (L) 07/10/2017     Follow up on 07/13/2018

## 2018-02-02 ENCOUNTER — Other Ambulatory Visit: Payer: Self-pay

## 2018-02-02 DIAGNOSIS — N921 Excessive and frequent menstruation with irregular cycle: Secondary | ICD-10-CM

## 2018-02-02 NOTE — Telephone Encounter (Signed)
Did you want to keep her on this medication? It said it was discontinued

## 2018-02-23 ENCOUNTER — Ambulatory Visit: Payer: Managed Care, Other (non HMO)

## 2018-02-23 DIAGNOSIS — Z3042 Encounter for surveillance of injectable contraceptive: Secondary | ICD-10-CM | POA: Insufficient documentation

## 2018-02-23 MED ORDER — MEDROXYPROGESTERONE ACETATE 150 MG/ML IM SUSY: 150.0000 mg | PREFILLED_SYRINGE | INTRAMUSCULAR | Status: DC

## 2018-02-23 NOTE — Progress Notes (Signed)
Date last pap: 07/10/2017  Last Depo-Provera: 11/25/2017  Side Effects if any: N/A Serum HCG indicated? no Depo-Provera 150 mg IM given by: Vonna Kotyk, CMA Next appointment due 3 months for Depo Physical scheduled for 07/13/2018

## 2018-02-24 ENCOUNTER — Ambulatory Visit: Payer: Managed Care, Other (non HMO)

## 2018-02-26 ENCOUNTER — Other Ambulatory Visit: Payer: Self-pay

## 2018-02-26 DIAGNOSIS — N921 Excessive and frequent menstruation with irregular cycle: Secondary | ICD-10-CM

## 2018-02-26 MED ORDER — NORETHIN ACE-ETH ESTRAD-FE 1-20 MG-MCG(24) PO CAPS: 1.0000 | ORAL_CAPSULE | Freq: Every day | ORAL | 0 refills | Status: DC

## 2018-03-23 ENCOUNTER — Other Ambulatory Visit: Payer: Self-pay

## 2018-03-23 DIAGNOSIS — N921 Excessive and frequent menstruation with irregular cycle: Secondary | ICD-10-CM

## 2018-03-23 NOTE — Telephone Encounter (Signed)
advise

## 2018-03-24 MED ORDER — NORETHIN ACE-ETH ESTRAD-FE 1-20 MG-MCG(24) PO CAPS: 1.0000 | ORAL_CAPSULE | Freq: Every day | ORAL | 0 refills | Status: DC

## 2018-04-15 ENCOUNTER — Encounter: Payer: Self-pay | Admitting: Emergency Medicine

## 2018-04-15 ENCOUNTER — Emergency Department: Payer: Managed Care, Other (non HMO)

## 2018-04-15 ENCOUNTER — Emergency Department
Admission: EM | Admit: 2018-04-15 | Discharge: 2018-04-15 | Disposition: A | Discharge status: Home / Self Care | Payer: Managed Care, Other (non HMO) | Attending: Emergency Medicine | Admitting: Emergency Medicine

## 2018-04-15 ENCOUNTER — Other Ambulatory Visit: Payer: Self-pay

## 2018-04-15 DIAGNOSIS — Y929 Unspecified place or not applicable: Secondary | ICD-10-CM | POA: Insufficient documentation

## 2018-04-15 DIAGNOSIS — S3992XA Unspecified injury of lower back, initial encounter: Secondary | ICD-10-CM | POA: Diagnosis present

## 2018-04-15 DIAGNOSIS — Y939 Activity, unspecified: Secondary | ICD-10-CM | POA: Insufficient documentation

## 2018-04-15 DIAGNOSIS — S3210XA Unspecified fracture of sacrum, initial encounter for closed fracture: Secondary | ICD-10-CM | POA: Diagnosis not present

## 2018-04-15 DIAGNOSIS — M25511 Pain in right shoulder: Secondary | ICD-10-CM | POA: Diagnosis not present

## 2018-04-15 DIAGNOSIS — Z79899 Other long term (current) drug therapy: Secondary | ICD-10-CM | POA: Insufficient documentation

## 2018-04-15 DIAGNOSIS — W19XXXA Unspecified fall, initial encounter: Secondary | ICD-10-CM

## 2018-04-15 DIAGNOSIS — Y999 Unspecified external cause status: Secondary | ICD-10-CM | POA: Diagnosis not present

## 2018-04-15 DIAGNOSIS — M25521 Pain in right elbow: Secondary | ICD-10-CM | POA: Insufficient documentation

## 2018-04-15 DIAGNOSIS — I10 Essential (primary) hypertension: Secondary | ICD-10-CM | POA: Diagnosis not present

## 2018-04-15 DIAGNOSIS — W1789XA Other fall from one level to another, initial encounter: Secondary | ICD-10-CM | POA: Insufficient documentation

## 2018-04-15 DIAGNOSIS — M25561 Pain in right knee: Secondary | ICD-10-CM | POA: Insufficient documentation

## 2018-04-15 LAB — POCT PREGNANCY, URINE: Preg Test, Ur: NEGATIVE
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
## 2018-04-15 MED ORDER — IBUPROFEN 600 MG PO TABS
600.0000 mg | ORAL_TABLET | Freq: Once | ORAL | Status: AC
  Administered 2018-04-15: 600 mg via ORAL
  Filled 2018-04-15: qty 1

## 2018-04-15 MED ORDER — TRAMADOL HCL 50 MG PO TABS: 50.0000 mg | ORAL_TABLET | Freq: Four times a day (QID) | ORAL | 0 refills | Status: AC | PRN
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
## 2018-04-15 NOTE — Discharge Instructions (Addendum)
Pain control: Take tylenol 1000mg every 8 hours. Take 50mg of tramadol every 6 hours for breakthrough pain. If you need the tramadol make sure to take one senokot as well to prevent constipation. ° °Do not drink alcohol, drive or participate in any other potentially dangerous activities while taking this medication as it may make you sleepy. Do not take this medication with any other sedating medications, either prescription or over-the-counter. ° °

## 2018-04-16 ENCOUNTER — Ambulatory Visit: Payer: Managed Care, Other (non HMO) | Admitting: Family Medicine

## 2018-04-16 ENCOUNTER — Encounter: Payer: Self-pay | Admitting: Family Medicine

## 2018-04-16 DIAGNOSIS — R0683 Snoring: Secondary | ICD-10-CM

## 2018-04-16 DIAGNOSIS — M5441 Lumbago with sciatica, right side: Secondary | ICD-10-CM

## 2018-04-16 DIAGNOSIS — M25551 Pain in right hip: Secondary | ICD-10-CM

## 2018-04-16 DIAGNOSIS — F5101 Primary insomnia: Secondary | ICD-10-CM

## 2018-04-16 DIAGNOSIS — I10 Essential (primary) hypertension: Secondary | ICD-10-CM | POA: Diagnosis not present

## 2018-04-16 MED ORDER — BACLOFEN 10 MG PO TABS: 10.0000 mg | ORAL_TABLET | Freq: Three times a day (TID) | ORAL | 0 refills | Status: DC

## 2018-04-16 MED ORDER — VALSARTAN-HYDROCHLOROTHIAZIDE 160-25 MG PO TABS: 1.0000 | ORAL_TABLET | Freq: Every day | ORAL | 1 refills | Status: DC
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
## 2018-04-28 ENCOUNTER — Other Ambulatory Visit: Payer: Self-pay | Admitting: Obstetrics and Gynecology

## 2018-04-28 DIAGNOSIS — N921 Excessive and frequent menstruation with irregular cycle: Secondary | ICD-10-CM

## 2018-05-21 ENCOUNTER — Ambulatory Visit: Payer: Managed Care, Other (non HMO) | Admitting: Family Medicine

## 2018-05-21 ENCOUNTER — Other Ambulatory Visit: Payer: Self-pay

## 2018-05-21 ENCOUNTER — Encounter: Payer: Self-pay | Admitting: Family Medicine

## 2018-05-21 VITALS — BP 120/80 | HR 77 | Temp 97.9°F | Resp 16 | Ht 60.0 in | Wt 192.9 lb

## 2018-05-21 DIAGNOSIS — N920 Excessive and frequent menstruation with regular cycle: Secondary | ICD-10-CM

## 2018-05-21 DIAGNOSIS — M5441 Lumbago with sciatica, right side: Secondary | ICD-10-CM

## 2018-05-21 DIAGNOSIS — I10 Essential (primary) hypertension: Secondary | ICD-10-CM | POA: Diagnosis not present

## 2018-05-21 DIAGNOSIS — J302 Other seasonal allergic rhinitis: Secondary | ICD-10-CM

## 2018-05-21 DIAGNOSIS — R0683 Snoring: Secondary | ICD-10-CM | POA: Diagnosis not present

## 2018-05-21 MED ORDER — IBUPROFEN 600 MG PO TABS: 600.0000 mg | ORAL_TABLET | Freq: Four times a day (QID) | ORAL | 0 refills | Status: AC | PRN

## 2018-05-21 MED ORDER — VALSARTAN-HYDROCHLOROTHIAZIDE 160-25 MG PO TABS: 1.0000 | ORAL_TABLET | Freq: Every day | ORAL | 0 refills | Status: DC
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
## 2018-05-25 ENCOUNTER — Ambulatory Visit: Payer: Managed Care, Other (non HMO)

## 2018-05-28 ENCOUNTER — Other Ambulatory Visit: Payer: Self-pay | Admitting: Obstetrics and Gynecology

## 2018-05-28 DIAGNOSIS — N921 Excessive and frequent menstruation with irregular cycle: Secondary | ICD-10-CM

## 2018-06-24 ENCOUNTER — Other Ambulatory Visit: Payer: Self-pay | Admitting: Obstetrics and Gynecology

## 2018-06-24 DIAGNOSIS — N921 Excessive and frequent menstruation with irregular cycle: Secondary | ICD-10-CM

## 2018-07-13 ENCOUNTER — Other Ambulatory Visit: Payer: Self-pay

## 2018-07-13 ENCOUNTER — Encounter: Payer: Self-pay | Admitting: Family Medicine

## 2018-07-13 ENCOUNTER — Ambulatory Visit (INDEPENDENT_AMBULATORY_CARE_PROVIDER_SITE_OTHER): Payer: Managed Care, Other (non HMO) | Admitting: Family Medicine

## 2018-07-13 VITALS — BP 120/80 | HR 89 | Temp 97.8°F | Resp 16 | Ht 60.0 in | Wt 196.1 lb

## 2018-07-13 DIAGNOSIS — Z01419 Encounter for gynecological examination (general) (routine) without abnormal findings: Secondary | ICD-10-CM | POA: Diagnosis not present

## 2018-07-13 NOTE — Progress Notes (Signed)
Name: Anita Newton   MRN: [id number]    DOB: [birth year]   Date:07/13/2018       Progress Note  Subjective  Chief Complaint  Chief Complaint  Patient presents with  . Annual Exam    HPI   Patient presents for annual CPE   Diet:she is eating more chicken and less red meat, baking instead of frying, still likes pasta , she is still drinking sweet tea or soda about 4 times a week, but she drinks juice on a regular basis  Exercise: moves at work, twice a week she walks at home also some running and weights for work twice a month  USPSTF grade A and B recommendations   Depression: Phq 9 is  positive Depression screen [facility] 2/9 05/21/2018 04/16/2018 07/10/2017 07/08/2016 03/03/2016  Decreased Interest 0 0 0 0 0  Down, Depressed, Hopeless 0 0 1 0 0  PHQ - 2 Score 0 0 1 0 0  Altered sleeping _0 0 -  Tired, decreased energy _1 0 -  Change in appetite 0 0 3 0 -  Feeling bad or failure about yourself  0 0 0 0 -  Trouble concentrating 0 0 0 0 -  Moving slowly or fidgety/restless 0 0 0 0 -  Suicidal thoughts 0 0 0 0 -  PHQ-9 Score _2 0 -  Difficult doing work/chores Not difficult at all Somewhat difficult Not difficult at all - -   Hypertension: BP Readings from Last 3 Encounters:  07/13/18 120/80  05/21/18 120/80  04/16/18 (!) 140/100   Obesity: Wt Readings from Last 3 Encounters:  07/13/18 196 lb 1.6 oz (89 kg)  05/21/18 192 lb 14.4 oz (87.5 kg)  04/16/18 194 lb 1.6 oz (88 kg)   BMI Readings from Last 3 Encounters:  07/13/18 38.30 kg/m  05/21/18 37.67 kg/m  04/16/18 37.91 kg/m    Hep C Screening: in 2019 STD testing and prevention (HIV/chl/gon/syphilis): intersted  Intimate partner violence: negative screen  Sexual History/Pain during Intercourse: no pain  Menstrual History/LMP/Abnormal Bleeding: on Depo and ocp to control symptoms seen by gyn last year  Incontinence Symptoms: none  Advanced Care Planning: A voluntary discussion about advance care planning  including the explanation and discussion of advance directives.  Discussed health care proxy and Living will, and the patient was able to identify a health care proxy as [name] .  Patient does not have a living will at present time.   Breast cancer: not due until age 45 BRCA gene screening: N/A Cervical cancer screening: 2019  Osteoporosis Screening:  Discussed high calcium diet    Lipids:  Lab Results  Component Value Date   CHOL 160 07/10/2017   CHOL 165 07/08/2016   CHOL 168 12/20/2014   Lab Results  Component Value Date   HDL 47 07/10/2017   HDL 42 07/08/2016   HDL 55 12/20/2014   Lab Results  Component Value Date   LDLCALC 103 (H) 07/10/2017   LDLCALC 103 (H) 07/08/2016   LDLCALC 100 (H) 12/20/2014   Lab Results  Component Value Date   TRIG 48 07/10/2017   TRIG 101 07/08/2016   TRIG 63 12/20/2014   Lab Results  Component Value Date   CHOLHDL 3.4 07/10/2017   CHOLHDL 3.9 07/08/2016   CHOLHDL 3.1 12/20/2014   No results found for: LDLDIRECT  Glucose:  Glucose  Date Value Ref Range Status  07/10/2017 91 65 - 99 mg/dL Final  07/08/2016 100 (H)  65 - 99 mg/dL Final  12/20/2014 85 65 - 99 mg/dL Final   Glucose, Bld  Date Value Ref Range Status  01/28/2015 94 65 - 99 mg/dL Final    Skin cancer: discussed atypical lesions  Colorectal cancer: discussed USPTF Lung cancer:   Low Dose CT Chest recommended if Age 15-80 years, 30 pack-year currently smoking OR have quit w/in 15years. Patient does not qualify.   [id number]   Patient Active Problem List   Diagnosis Date Noted  . Morbid obesity ([place]) 04/16/2018  . Encounter for surveillance of injectable contraceptive 02/23/2018  . DUB (dysfunctional uterine bleeding) 07/16/2017  . B12 deficiency 07/08/2016  . Iron deficiency anemia 07/08/2016  . Seasonal allergic rhinitis 05/16/2015  . History of eclampsia 12/15/2014  . Hypertension, benign 12/15/2014  . Large breasts 12/15/2014  . Obesity (BMI 35.0-39.9  without comorbidity) 12/15/2014  . Thoracic back pain 12/15/2014  . Vitamin D deficiency 12/15/2014    Past Surgical History:  Procedure Laterality Date  . DILATATION & CURETTAGE/HYSTEROSCOPY WITH MYOSURE N/A 10/06/2017   Procedure: DILATATION & CURETTAGE /HYSTEROSCOPY;  Surgeon: Homero Fellers, MD;  Location: [facility] ORS;  Service: Gynecology;  Laterality: N/A;  . EYE SURGERY    . TUBAL LIGATION      Family History  Problem Relation Age of Onset  . Cancer Mother        eye tumor  . Diabetes Mother   . Lupus Mother   . Alcohol abuse Father   . Drug abuse Father   . Eczema Daughter   . ADD / ADHD Daughter   . ADD / ADHD Son   . Allergic Disorder Son   . Lupus Sister   . Depression Sister   . Hypertension Brother     Social History   Socioeconomic History  . Marital status: Single    Spouse name: Not on file  . Number of children: 3  . Years of education: Not on file  . Highest education level: Some college, no degree  Occupational History  . Not on file  Social Needs  . Financial resource strain: Somewhat hard  . Food insecurity:    Worry: Never true    Inability: Never true  . Transportation needs:    Medical: No    Non-medical: No  Tobacco Use  . Smoking status: Never Smoker  . Smokeless tobacco: Never Used  Substance and Sexual Activity  . Alcohol use: No    Alcohol/week: 0.0 standard drinks  . Drug use: No  . Sexual activity: Yes    Birth control/protection: Condom, Injection, Surgical    Comment: Tubal Ligation   Lifestyle  . Physical activity:    Days per week: 0 days    Minutes per session: 0 min  . Stress: To some extent  Relationships  . Social connections:    Talks on phone: More than three times a week    Gets together: More than three times a week    Attends religious service: Never    Active member of club or organization: No    Attends meetings of clubs or organizations: Never    Relationship status: Divorced  . Intimate partner  violence:    Fear of current or ex partner: No    Emotionally abused: No    Physically abused: No    Forced sexual activity: No  Other Topics Concern  . Not on file  Social History Narrative  . Not on file     Current Outpatient Medications:  .  fluticasone (FLONASE) 50 MCG/ACT nasal spray, Place 2 sprays into both nostrils daily. (Patient taking differently: Place 2 sprays into both nostrils daily as needed for allergies. ), Disp: 16 g, Rfl: 6 .  ibuprofen (ADVIL,MOTRIN) 600 MG tablet, Take 1 tablet (600 mg total) by mouth every 6 (six) hours as needed for cramping., Disp: 60 tablet, Rfl: 0 .  loratadine (CLARITIN) 10 MG tablet, Take 1 tablet (10 mg total) by mouth daily as needed for allergies., Disp: 30 tablet, Rfl: 11 .  medroxyPROGESTERone (DEPO-PROVERA) 150 MG/ML injection, , Disp: , Rfl:  .  montelukast (SINGULAIR) 10 MG tablet, Take 1 tablet (10 mg total) by mouth at bedtime., Disp: 90 tablet, Rfl: 1 .  Multiple Vitamin (MULTIVITAMIN) tablet, Take 1 tablet by mouth daily., Disp: , Rfl:  .  Omega-3 Fatty Acids (FISH OIL) 1000 MG CAPS, Take 1,000 mg by mouth once a week. , Disp: , Rfl:  .  TAYTULLA 1-20 MG-MCG(24) CAPS, TAKE 1 CAPSULE BY MOUTH EVERY DAY, Disp: 28 capsule, Rfl: 0 .  valsartan-hydrochlorothiazide (DIOVAN-HCT) 160-25 MG tablet, Take 1 tablet by mouth daily., Disp: 90 tablet, Rfl: 0 .  vitamin B-12 (CYANOCOBALAMIN) 50 MCG tablet, Take 50 mcg by mouth daily., Disp: , Rfl:  .  vitamin C (ASCORBIC ACID) 500 MG tablet, Take 500 mg by mouth daily. Reported on 02/01/2015, Disp: , Rfl:  .  Vitamin D, Ergocalciferol, (DRISDOL) 50000 units CAPS capsule, Take 1 capsule (50,000 Units total) by mouth once a week., Disp: 12 capsule, Rfl: 1 .  baclofen (LIORESAL) 10 MG tablet, Take 1-2 tablets (10-20 mg total) by mouth 3 (three) times daily. (Patient not taking: Reported on 07/13/2018), Disp: 40 each, Rfl: 0 .  traMADol (ULTRAM) 50 MG tablet, Take 1 tablet (50 mg total) by mouth every 6  (six) hours as needed. (Patient not taking: Reported on 07/13/2018), Disp: 12 tablet, Rfl: 0  Allergies  Allergen Reactions  . Cherry Hives and Swelling    Fresh cherries with a stem     ROS  Constitutional: Negative for fever or weight change.  Respiratory: Negative for cough and shortness of breath.   Cardiovascular: Negative for chest pain or palpitations.  Gastrointestinal: Negative for abdominal pain, no bowel changes.  Musculoskeletal: Negative for gait problem or joint swelling.  Skin: Negative for rash.  Neurological: Negative for dizziness or headache.  No other specific complaints in a complete review of systems (except as listed in HPI above).  Objective  Vitals:   07/13/18 0830  BP: 120/80  Pulse: 89  Resp: 16  Temp: 97.8 F (36.6 C)  TempSrc: Oral  SpO2: 98%  Weight: 196 lb 1.6 oz (89 kg)  Height: 5' (1.524 m)    Body mass index is 38.3 kg/m.  Physical Exam  Constitutional: Patient appears well-developed and well-nourished. No distress.  HENT: Head: Normocephalic and atraumatic. Ears: B TMs ok, no erythema or effusion; Nose: Nose normal. Mouth/Throat: Oropharynx is clear and moist. No oropharyngeal exudate.  Eyes: Conjunctivae and EOM are normal. Pupils are equal, round, and reactive to light. No scleral icterus.  Neck: Normal range of motion. Neck supple. No JVD present. No thyromegaly present.  Cardiovascular: Normal rate, regular rhythm and normal heart sounds.  No murmur heard. No BLE edema. Pulmonary/Chest: Effort normal and breath sounds normal. No respiratory distress. Abdominal: Soft. Bowel sounds are normal, no distension. There is no tenderness. no masses Breast: no lumps or masses, no nipple discharge or rashes FEMALE GENITALIA:  Not done RECTAL: not  done Musculoskeletal: Normal range of motion, no joint effusions. No gross deformities Neurological: he is alert and oriented to person, place, and time. No cranial nerve deficit. Coordination,  balance, strength, speech and gait are normal.  Skin: Skin is warm and dry. No rash noted. No erythema.  Psychiatric: Patient has a normal mood and affect. behavior is normal. Judgment and thought content normal.   Recent Results (from the past 2160 hour(s))  Pregnancy, urine POC     Status: None   Collection Time: 04/15/18  3:20 AM  Result Value Ref Range   Preg Test, Ur NEGATIVE NEGATIVE    Comment:        THE SENSITIVITY OF THIS METHODOLOGY IS >24 mIU/mL      PHQ2/9: Depression screen [facility] 2/9 05/21/2018 04/16/2018 07/10/2017 07/08/2016 03/03/2016  Decreased Interest 0 0 0 0 0  Down, Depressed, Hopeless 0 0 1 0 0  PHQ - 2 Score 0 0 1 0 0  Altered sleeping _0 0 -  Tired, decreased energy _1 0 -  Change in appetite 0 0 3 0 -  Feeling bad or failure about yourself  0 0 0 0 -  Trouble concentrating 0 0 0 0 -  Moving slowly or fidgety/restless 0 0 0 0 -  Suicidal thoughts 0 0 0 0 -  PHQ-9 Score _2 0 -  Difficult doing work/chores Not difficult at all Somewhat difficult Not difficult at all - -     Fall Risk: Fall Risk  07/13/2018 05/21/2018 04/16/2018 07/10/2017 03/03/2016  Falls in the past year? 1 0 1 No No  Number falls in past yr: 1 0 1 - -  Injury with Fall? 1 0 1 - -     Functional Status Survey: Is the patient deaf or have difficulty hearing?: No Does the patient have difficulty seeing, even when wearing glasses/contacts?: No Does the patient have difficulty concentrating, remembering, or making decisions?: No Does the patient have difficulty walking or climbing stairs?: No Does the patient have difficulty dressing or bathing?: No Does the patient have difficulty doing errands alone such as visiting a doctor's office or shopping?: No   Assessment & Plan  1. Well woman exam with routine gynecological exam  - Lipid panel - CBC with Differential/Platelet - Comprehensive metabolic panel - VITAMIN D 25 Hydroxy (Vit-D Deficiency, Fractures) - TSH - Hemoglobin  A1c - Hepatitis panel, acute - RPR - HIV Antibody (routine testing w rflx) - Urine cytology ancillary only - Ferritin  -USPSTF grade A and B recommendations reviewed with patient; age-appropriate recommendations, preventive care, screening tests, etc discussed and encouraged; healthy living encouraged; see AVS for patient education given to patient -Discussed importance of 150 minutes of physical activity weekly, eat two servings of fish weekly, eat one serving of tree nuts ( cashews, pistachios, pecans, almonds.Marland Kitchen) every other day, eat 6 servings of fruit/vegetables daily and drink plenty of water and avoid sweet beverages.

## 2018-07-20 ENCOUNTER — Other Ambulatory Visit: Payer: Self-pay

## 2018-07-20 ENCOUNTER — Other Ambulatory Visit: Payer: Managed Care, Other (non HMO)

## 2018-07-20 DIAGNOSIS — Z01419 Encounter for gynecological examination (general) (routine) without abnormal findings: Secondary | ICD-10-CM | POA: Diagnosis not present

## 2018-07-20 DIAGNOSIS — Z0189 Encounter for other specified special examinations: Secondary | ICD-10-CM | POA: Diagnosis not present

## 2018-07-20 DIAGNOSIS — Z008 Encounter for other general examination: Secondary | ICD-10-CM

## 2018-07-20 NOTE — Patient Instructions (Signed)
Follow up with primary care as needed for chronic and maintenance health care- can be seen in this employee clinic for acute care.    Health Maintenance, Female Adopting a healthy lifestyle and getting preventive care can go a long way to promote health and wellness. Talk with your health care provider about what schedule of regular examinations is right for you. This is a good chance for you to check in with your provider about disease prevention and staying healthy. In between checkups, there are plenty of things you can do on your own. Experts have done a lot of research about which lifestyle changes and preventive measures are most likely to keep you healthy. Ask your health care provider for more information. Weight and diet Eat a healthy diet  Be sure to include plenty of vegetables, fruits, low-fat dairy products, and lean protein.  Do not eat a lot of foods high in solid fats, added sugars, or salt.  Get regular exercise. This is one of the most important things you can do for your health. ? Most adults should exercise for at least 150 minutes each week. The exercise should increase your heart rate and make you sweat (moderate-intensity exercise). ? Most adults should also do strengthening exercises at least twice a week. This is in addition to the moderate-intensity exercise. Maintain a healthy weight  Body mass index (BMI) is a measurement that can be used to identify possible weight problems. It estimates body fat based on height and weight. Your health care provider can help determine your BMI and help you achieve or maintain a healthy weight.  For females 50 years of age and older: ? A BMI below 18.5 is considered underweight. ? A BMI of 18.5 to 24.9 is normal. ? A BMI of 25 to 29.9 is considered overweight. ? A BMI of 30 and above is considered obese. Watch levels of cholesterol and blood lipids  You should start having your blood tested for lipids and cholesterol at 39  years of age, then have this test every 5 years.  You may need to have your cholesterol levels checked more often if: ? Your lipid or cholesterol levels are high. ? You are older than 39 years of age. ? You are at high risk for heart disease. Cancer screening Lung Cancer  Lung cancer screening is recommended for adults 39-107 years old who are at high risk for lung cancer because of a history of smoking.  A yearly low-dose CT scan of the lungs is recommended for people who: ? Currently smoke. ? Have quit within the past 15 years. ? Have at least a 30-pack-year history of smoking. A pack year is smoking an average of one pack of cigarettes a day for 1 year.  Yearly screening should continue until it has been 15 years since you quit.  Yearly screening should stop if you develop a health problem that would prevent you from having lung cancer treatment. Breast Cancer  Practice breast self-awareness. This means understanding how your breasts normally appear and feel.  It also means doing regular breast self-exams. Let your health care provider know about any changes, no matter how small.  If you are in your 20s or 30s, you should have a clinical breast exam (CBE) by a health care provider every 1-3 years as part of a regular health exam.  If you are 52 or older, have a CBE every year. Also consider having a breast X-ray (mammogram) every year.  If you have  a family history of breast cancer, talk to your health care provider about genetic screening.  If you are at high risk for breast cancer, talk to your health care provider about having an MRI and a mammogram every year.  Breast cancer gene (BRCA) assessment is recommended for women who have family members with BRCA-related cancers. BRCA-related cancers include: ? Breast. ? Ovarian. ? Tubal. ? Peritoneal cancers.  Results of the assessment will determine the need for genetic counseling and BRCA1 and BRCA2 testing. Cervical  Cancer Your health care provider may recommend that you be screened regularly for cancer of the pelvic organs (ovaries, uterus, and vagina). This screening involves a pelvic examination, including checking for microscopic changes to the surface of your cervix (Pap test). You may be encouraged to have this screening done every 3 years, beginning at age 39.  For women ages 62-65, health care providers may recommend pelvic exams and Pap testing every 3 years, or they may recommend the Pap and pelvic exam, combined with testing for human papilloma virus (HPV), every 5 years. Some types of HPV increase your risk of cervical cancer. Testing for HPV may also be done on women of any age with unclear Pap test results.  Other health care providers may not recommend any screening for nonpregnant women who are considered low risk for pelvic cancer and who do not have symptoms. Ask your health care provider if a screening pelvic exam is right for you.  If you have had past treatment for cervical cancer or a condition that could lead to cancer, you need Pap tests and screening for cancer for at least 20 years after your treatment. If Pap tests have been discontinued, your risk factors (such as having a new sexual partner) need to be reassessed to determine if screening should resume. Some women have medical problems that increase the chance of getting cervical cancer. In these cases, your health care provider may recommend more frequent screening and Pap tests. Colorectal Cancer  This type of cancer can be detected and often prevented.  Routine colorectal cancer screening usually begins at 39 years of age and continues through 39 years of age.  Your health care provider may recommend screening at an earlier age if you have risk factors for colon cancer.  Your health care provider may also recommend using home test kits to check for hidden blood in the stool.  A small camera at the end of a tube can be used to  examine your colon directly (sigmoidoscopy or colonoscopy). This is done to check for the earliest forms of colorectal cancer.  Routine screening usually begins at age 39.  Direct examination of the colon should be repeated every 5-10 years through 39 years of age. However, you may need to be screened more often if early forms of precancerous polyps or small growths are found. Skin Cancer  Check your skin from head to toe regularly.  Tell your health care provider about any new moles or changes in moles, especially if there is a change in a mole's shape or color.  Also tell your health care provider if you have a mole that is larger than the size of a pencil eraser.  Always use sunscreen. Apply sunscreen liberally and repeatedly throughout the day.  Protect yourself by wearing long sleeves, pants, a wide-brimmed hat, and sunglasses whenever you are outside. Heart disease, diabetes, and high blood pressure  High blood pressure causes heart disease and increases the risk of stroke. High blood  pressure is more likely to develop in: ? People who have blood pressure in the high end of the normal range (130-139/85-89 mm Hg). ? People who are overweight or obese. ? People who are African American.  If you are 24-69 years of age, have your blood pressure checked every 3-5 years. If you are 1 years of age or older, have your blood pressure checked every year. You should have your blood pressure measured twice-once when you are at a hospital or clinic, and once when you are not at a hospital or clinic. Record the average of the two measurements. To check your blood pressure when you are not at a hospital or clinic, you can use: ? An automated blood pressure machine at a pharmacy. ? A home blood pressure monitor.  If you are between 24 years and 59 years old, ask your health care provider if you should take aspirin to prevent strokes.  Have regular diabetes screenings. This involves taking a blood  sample to check your fasting blood sugar level. ? If you are at a normal weight and have a low risk for diabetes, have this test once every three years after 39 years of age. ? If you are overweight and have a high risk for diabetes, consider being tested at a younger age or more often. Preventing infection Hepatitis B  If you have a higher risk for hepatitis B, you should be screened for this virus. You are considered at high risk for hepatitis B if: ? You were born in a country where hepatitis B is common. Ask your health care provider which countries are considered high risk. ? Your parents were born in a high-risk country, and you have not been immunized against hepatitis B (hepatitis B vaccine). ? You have HIV or AIDS. ? You use needles to inject street drugs. ? You live with someone who has hepatitis B. ? You have had sex with someone who has hepatitis B. ? You get hemodialysis treatment. ? You take certain medicines for conditions, including cancer, organ transplantation, and autoimmune conditions. Hepatitis C  Blood testing is recommended for: ? Everyone born from [birth year] through 1966. ? Anyone with known risk factors for hepatitis C. Sexually transmitted infections (STIs)  You should be screened for sexually transmitted infections (STIs) including gonorrhea and chlamydia if: ? You are sexually active and are younger than 39 years of age. ? You are older than 39 years of age and your health care provider tells you that you are at risk for this type of infection. ? Your sexual activity has changed since you were last screened and you are at an increased risk for chlamydia or gonorrhea. Ask your health care provider if you are at risk.  If you do not have HIV, but are at risk, it may be recommended that you take a prescription medicine daily to prevent HIV infection. This is called pre-exposure prophylaxis (PrEP). You are considered at risk if: ? You are sexually active and do not  regularly use condoms or know the HIV status of your partner(s). ? You take drugs by injection. ? You are sexually active with a partner who has HIV. Talk with your health care provider about whether you are at high risk of being infected with HIV. If you choose to begin PrEP, you should first be tested for HIV. You should then be tested every 3 months for as long as you are taking PrEP. Pregnancy  If you are premenopausal and you may  become pregnant, ask your health care provider about preconception counseling.  If you may become pregnant, take 400 to 800 micrograms (mcg) of folic acid every day.  If you want to prevent pregnancy, talk to your health care provider about birth control (contraception). Osteoporosis and menopause  Osteoporosis is a disease in which the bones lose minerals and strength with aging. This can result in serious bone fractures. Your risk for osteoporosis can be identified using a bone density scan.  If you are 24 years of age or older, or if you are at risk for osteoporosis and fractures, ask your health care provider if you should be screened.  Ask your health care provider whether you should take a calcium or vitamin D supplement to lower your risk for osteoporosis.  Menopause may have certain physical symptoms and risks.  Hormone replacement therapy may reduce some of these symptoms and risks. Talk to your health care provider about whether hormone replacement therapy is right for you. Follow these instructions at home:  Schedule regular health, dental, and eye exams.  Stay current with your immunizations.  Do not use any tobacco products including cigarettes, chewing tobacco, or electronic cigarettes.  If you are pregnant, do not drink alcohol.  If you are breastfeeding, limit how much and how often you drink alcohol.  Limit alcohol intake to no more than 1 drink per day for nonpregnant women. One drink equals 12 ounces of beer, 5 ounces of wine, or 1  ounces of hard liquor.  Do not use street drugs.  Do not share needles.  Ask your health care provider for help if you need support or information about quitting drugs.  Tell your health care provider if you often feel depressed.  Tell your health care provider if you have ever been abused or do not feel safe at home. This information is not intended to replace advice given to you by your health care provider. Make sure you discuss any questions you have with your health care provider. Document Released: 08/12/2010 Document Revised: 07/05/2015 Document Reviewed: 10/31/2014 Elsevier Interactive Patient Education  2019 [employer].

## 2018-07-21 LAB — RPR QUALITATIVE: RPR Ser Ql: NONREACTIVE

## 2018-07-21 LAB — CBC WITH DIFFERENTIAL/PLATELET
Basophils Absolute: 0 10*3/uL (ref 0.0–0.2)
Basos: 1 %
EOS (ABSOLUTE): 0.2 10*3/uL (ref 0.0–0.4)
Eos: 3 %
Hematocrit: 36.8 % (ref 34.0–46.6)
Hemoglobin: 12.6 g/dL (ref 11.1–15.9)
Immature Grans (Abs): 0 10*3/uL (ref 0.0–0.1)
Immature Granulocytes: 0 %
Lymphocytes Absolute: 2.3 10*3/uL (ref 0.7–3.1)
Lymphs: 40 %
MCH: 30 pg (ref 26.6–33.0)
MCHC: 34.2 g/dL (ref 31.5–35.7)
MCV: 88 fL (ref 79–97)
Monocytes Absolute: 0.4 10*3/uL (ref 0.1–0.9)
Monocytes: 7 %
Neutrophils Absolute: 2.9 10*3/uL (ref 1.4–7.0)
Neutrophils: 49 %
Platelets: 385 10*3/uL (ref 150–450)
RBC: 4.2 x10E6/uL (ref 3.77–5.28)
RDW: 12.5 % (ref 11.7–15.4)
WBC: 5.9 10*3/uL (ref 3.4–10.8)

## 2018-07-21 LAB — COMPREHENSIVE METABOLIC PANEL
ALT: 10 IU/L (ref 0–32)
AST: 14 IU/L (ref 0–40)
Albumin/Globulin Ratio: 1.3 (ref 1.2–2.2)
Albumin: 3.9 g/dL (ref 3.8–4.8)
Alkaline Phosphatase: 51 IU/L (ref 39–117)
BUN/Creatinine Ratio: 22 (ref 9–23)
BUN: 15 mg/dL (ref 6–20)
Bilirubin Total: 0.2 mg/dL (ref 0.0–1.2)
CO2: 23 mmol/L (ref 20–29)
Calcium: 9.2 mg/dL (ref 8.7–10.2)
Chloride: 100 mmol/L (ref 96–106)
Creatinine, Ser: 0.68 mg/dL (ref 0.57–1.00)
GFR calc Af Amer: 128 mL/min/{1.73_m2} (ref >59)
GFR calc non Af Amer: 111 mL/min/{1.73_m2} (ref >59)
Globulin, Total: 2.9 g/dL (ref 1.5–4.5)
Glucose: 93 mg/dL (ref 65–99)
Potassium: 3.9 mmol/L (ref 3.5–5.2)
Sodium: 137 mmol/L (ref 134–144)
Total Protein: 6.8 g/dL (ref 6.0–8.5)

## 2018-07-21 LAB — HEPATIC FUNCTION PANEL: Bilirubin, Direct: 0.07 mg/dL (ref 0.00–0.40)

## 2018-07-21 LAB — LIPID PANEL WITH LDL/HDL RATIO
Cholesterol, Total: 150 mg/dL (ref 100–199)
HDL: 48 mg/dL (ref >39)
LDL Calculated: 84 mg/dL (ref 0–99)
LDl/HDL Ratio: 1.8 ratio (ref 0.0–3.2)
Triglycerides: 92 mg/dL (ref 0–149)
VLDL Cholesterol Cal: 18 mg/dL (ref 5–40)

## 2018-07-21 LAB — HIV ANTIBODY (ROUTINE TESTING W REFLEX): HIV Screen 4th Generation wRfx: NONREACTIVE

## 2018-07-21 LAB — FERRITIN: Ferritin: 127 ng/mL (ref 15–150)

## 2018-07-21 LAB — TSH: TSH: 3.06 u[IU]/mL (ref 0.450–4.500)

## 2018-07-21 LAB — VITAMIN B12: Vitamin B-12: 231 pg/mL — ABNORMAL LOW (ref 232–1245)

## 2018-07-21 LAB — VITAMIN D 25 HYDROXY (VIT D DEFICIENCY, FRACTURES): Vit D, 25-Hydroxy: 20.8 ng/mL — ABNORMAL LOW (ref 30.0–100.0)

## 2018-07-21 LAB — HGB A1C W/O EAG: Hgb A1c MFr Bld: 5.4 % (ref 4.8–5.6)

## 2018-07-29 ENCOUNTER — Other Ambulatory Visit: Payer: Self-pay | Admitting: Family Medicine

## 2018-07-29 DIAGNOSIS — E559 Vitamin D deficiency, unspecified: Secondary | ICD-10-CM

## 2018-07-29 DIAGNOSIS — I10 Essential (primary) hypertension: Secondary | ICD-10-CM

## 2018-07-30 MED ORDER — VITAMIN D (ERGOCALCIFEROL) 1.25 MG (50000 UNIT) PO CAPS: 50000.0000 [IU] | ORAL_CAPSULE | ORAL | 0 refills | Status: DC

## 2018-07-30 NOTE — Addendum Note (Signed)
Addended by: Steele Sizer F on: 07/30/2018 03:34 PM   Modules accepted: Orders

## 2018-08-10 ENCOUNTER — Encounter: Payer: Self-pay | Admitting: Adult Health

## 2018-08-11 ENCOUNTER — Ambulatory Visit: Payer: Managed Care, Other (non HMO) | Admitting: Adult Health

## 2018-08-11 ENCOUNTER — Encounter: Payer: Self-pay | Admitting: Adult Health

## 2018-08-11 ENCOUNTER — Other Ambulatory Visit: Payer: Self-pay

## 2018-08-11 ENCOUNTER — Telehealth: Payer: Self-pay | Admitting: *Deleted

## 2018-08-11 DIAGNOSIS — Z20828 Contact with and (suspected) exposure to other viral communicable diseases: Secondary | ICD-10-CM

## 2018-08-11 DIAGNOSIS — Z20822 Contact with and (suspected) exposure to covid-19: Secondary | ICD-10-CM

## 2018-08-11 DIAGNOSIS — Z7189 Other specified counseling: Secondary | ICD-10-CM

## 2018-08-11 NOTE — Telephone Encounter (Signed)
Left voicemail for patient to return call to schedule COVID 19 test from 7a - 7p Monday - Friday.  Test ordered. 

## 2018-08-11 NOTE — Progress Notes (Signed)
Virtual Visit via Telephone Note  I connected with Anita Newton on 08/11/18 at 11:30 AM EDT by telephone and verified that I am speaking with the correct person using two identifiers.  Location: Patient: at home   Provider: [facility], [place], [place]     I discussed the limitations, risks, security and privacy concerns of performing an evaluation and management service by telephone and the availability of in person appointments. I also discussed with the patient that there may be a patient responsible charge related to this service. The patient expressed understanding and agreed to proceed.   History of Present Illness: Patient is a 38 year old female in no acute distress who on June 24th she was transporting a ICE inmate from [employer], and this patient was found to have a fever when  they transported him to [place] jail and then to [place] after that. Then  this prisoner tested positive for Covid 19 on June 30th. She was wearing a mask and the inmate was not the entire time they transported in [place].  She was in the car a total of 6 hours with this person.  She did not wear a mask in the car.   She went through the belongings of the person that tested positive, she hooked him up in handcuffs.   She denies any symptoms. Denies any fever.  She has been checking her temperature daily.   Patient  denies any fever, body aches,chills, rash, chest pain, shortness of breath, nausea, vomiting, or diarrhea.     Observations/Objective: She reports she is afebrile. No other vital signs available.  Patient is alert and oriented and responsive to questions Engages in conversation with provider. Speaks in full sentences without any pauses without any shortness of breath or distress.    Assessment and Plan: 1. Educated About Covid-19 Virus Infection   2. Close Exposure to Covid-19 Virus      Follow Up Instructions:  14 day  quarantine- copy of work note.   To Whom It May Concern:  It is my medical opinion that Anita Newton may return to work on Wednesday July 8th if she is symptom free and no fever for 72 hours without fever reducers. She was directly exposed to a Covid 19 patient while at work. She will stay home on July 8th should she develop any symptoms or fever and call the clinic for further evaluation at that time, .  I discussed the assessment and treatment plan with the patient. The patient was provided an opportunity to ask questions and all were answered. The patient agreed with the plan and demonstrated an understanding of the instructions.  Discussed home monitoring and when to call the clinic. The patient was advised to call back or seek an in-person evaluation if the symptoms worsen or if the condition fails to improve as anticipated.  I provided 15 minutes of non-face-to-face time during this encounter.   Marcille Buffy, FNP

## 2018-08-11 NOTE — Telephone Encounter (Signed)
-----   Message from Doreen Beam, [place] sent at 08/11/2018 11:33 AM EDT ----- [facility] clinic. Asymptomatic, exposed to [place] positive inmate on 6/24. Needs testing for Covid 19

## 2018-08-11 NOTE — Patient Instructions (Signed)
Person Under Monitoring Name: Anita Newton  Location: [address] [place] [address]   Infection Prevention Recommendations for Individuals Confirmed to have, or Being Evaluated for, 2019 Novel Coronavirus (COVID-19) Infection Who Receive Care at Home  Individuals who are confirmed to have, or are being evaluated for, COVID-19 should follow the prevention steps below until a healthcare provider or local or state health department says they can return to normal activities.  Stay home except to get medical care You should restrict activities outside your home, except for getting medical care. Do not go to work, school, or public areas, and do not use public transportation or taxis.  Call ahead before visiting your doctor Before your medical appointment, call the healthcare provider and tell them that you have, or are being evaluated for, COVID-19 infection. This will help the healthcare provider's office take steps to keep other people from getting infected. Ask your healthcare provider to call the local or state health department.  Monitor your symptoms Seek prompt medical attention if your illness is worsening (e.g., difficulty breathing). Before going to your medical appointment, call the healthcare provider and tell them that you have, or are being evaluated for, COVID-19 infection. Ask your healthcare provider to call the local or state health department.  Wear a facemask You should wear a facemask that covers your nose and mouth when you are in the same room with other people and when you visit a healthcare provider. People who live with or visit you should also wear a facemask while they are in the same room with you.  Separate yourself from other people in your home As much as possible, you should stay in a different room from other people in your home. Also, you should use a separate bathroom, if available.  Avoid sharing household items You should not share  dishes, drinking glasses, cups, eating utensils, towels, bedding, or other items with other people in your home. After using these items, you should wash them thoroughly with soap and water.  Cover your coughs and sneezes Cover your mouth and nose with a tissue when you cough or sneeze, or you can cough or sneeze into your sleeve. Throw used tissues in a lined trash can, and immediately wash your hands with soap and water for at least 20 seconds or use an alcohol-based hand rub.  Wash your [facility] your hands often and thoroughly with soap and water for at least 20 seconds. You can use an alcohol-based hand sanitizer if soap and water are not available and if your hands are not visibly dirty. Avoid touching your eyes, nose, and mouth with unwashed hands.   Prevention Steps for Caregivers and Household Members of Individuals Confirmed to have, or Being Evaluated for, COVID-19 Infection Being Cared for in the Home  If you live with, or provide care at home for, a person confirmed to have, or being evaluated for, COVID-19 infection please follow these guidelines to prevent infection:  Follow healthcare provider's instructions Make sure that you understand and can help the patient follow any healthcare provider instructions for all care.  Provide for the patient's basic needs You should help the patient with basic needs in the home and provide support for getting groceries, prescriptions, and other personal needs.  Monitor the patient's symptoms If they are getting sicker, call his or her medical provider and tell them that the patient has, or is being evaluated for, COVID-19 infection. This will help the healthcare provider's office  take steps to keep other people from getting infected. Ask the healthcare provider to call the local or state health department.  Limit the number of people who have contact with the patient  If possible, have only one caregiver for the patient.  Other  household members should stay in another home or place of residence. If this is not possible, they should stay  in another room, or be separated from the patient as much as possible. Use a separate bathroom, if available.  Restrict visitors who do not have an essential need to be in the home.  Keep older adults, very young children, and other sick people away from the patient Keep older adults, very young children, and those who have compromised immune systems or chronic health conditions away from the patient. This includes people with chronic heart, lung, or kidney conditions, diabetes, and cancer.  Ensure good ventilation Make sure that shared spaces in the home have good air flow, such as from an air conditioner or an opened window, weather permitting.  Wash your hands often  Wash your hands often and thoroughly with soap and water for at least 20 seconds. You can use an alcohol based hand sanitizer if soap and water are not available and if your hands are not visibly dirty.  Avoid touching your eyes, nose, and mouth with unwashed hands.  Use disposable paper towels to dry your hands. If not available, use dedicated cloth towels and replace them when they become wet.  Wear a facemask and gloves  Wear a disposable facemask at all times in the room and gloves when you touch or have contact with the patient's blood, body fluids, and/or secretions or excretions, such as sweat, saliva, sputum, nasal mucus, vomit, urine, or feces.  Ensure the mask fits over your nose and mouth tightly, and do not touch it during use.  Throw out disposable facemasks and gloves after using them. Do not reuse.  Wash your hands immediately after removing your facemask and gloves.  If your personal clothing becomes contaminated, carefully remove clothing and launder. Wash your hands after handling contaminated clothing.  Place all used disposable facemasks, gloves, and other waste in a lined container before  disposing them with other household waste.  Remove gloves and wash your hands immediately after handling these items.  Do not share dishes, glasses, or other household items with the patient  Avoid sharing household items. You should not share dishes, drinking glasses, cups, eating utensils, towels, bedding, or other items with a patient who is confirmed to have, or being evaluated for, COVID-19 infection.  After the person uses these items, you should wash them thoroughly with soap and water.  Wash laundry thoroughly  Immediately remove and wash clothes or bedding that have blood, body fluids, and/or secretions or excretions, such as sweat, saliva, sputum, nasal mucus, vomit, urine, or feces, on them.  Wear gloves when handling laundry from the patient.  Read and follow directions on labels of laundry or clothing items and detergent. In general, wash and dry with the warmest temperatures recommended on the label.  Clean all areas the individual has used often  Clean all touchable surfaces, such as counters, tabletops, doorknobs, bathroom fixtures, toilets, phones, keyboards, tablets, and bedside tables, every day. Also, clean any surfaces that may have blood, body fluids, and/or secretions or excretions on them.  Wear gloves when cleaning surfaces the patient has come in contact with.  Use a diluted bleach solution (e.g., dilute bleach with 1 part  bleach and 10 parts water) or a household disinfectant with a label that says EPA-registered for coronaviruses. To make a bleach solution at home, add 1 tablespoon of bleach to 1 quart (4 cups) of water. For a larger supply, add  cup of bleach to 1 gallon (16 cups) of water.  Read labels of cleaning products and follow recommendations provided on product labels. Labels contain instructions for safe and effective use of the cleaning product including precautions you should take when applying the product, such as wearing gloves or eye protection  and making sure you have good ventilation during use of the product.  Remove gloves and wash hands immediately after cleaning.  Monitor yourself for signs and symptoms of illness Caregivers and household members are considered close contacts, should monitor their health, and will be asked to limit movement outside of the home to the extent possible. Follow the monitoring steps for close contacts listed on the symptom monitoring form.   ? If you have additional questions, contact your local health department or call the epidemiologist on call at [phone] (available 24/7). ? This guidance is subject to change. For the most up-to-date guidance from CDC, please refer to their website: https://www.taylor.biz/: How to Protect Yourself and Others Know how it spreads  There is currently no vaccine to prevent coronavirus disease 2019 (COVID-19).  The best way to prevent illness is to avoid being exposed to this virus.  The virus is thought to spread mainly from person-to-person. ? Between people who are in close contact with one another (within about 6 feet). ? Through respiratory droplets produced when an infected person coughs, sneezes or talks. ? These droplets can land in the mouths or noses of people who are nearby or possibly be inhaled into the lungs. ? Some recent studies have suggested that COVID-19 may be spread by people who are not showing symptoms. Everyone should Clean your hands often  Wash your hands often with soap and water for at least 20 seconds especially after you have been in a public place, or after blowing your nose, coughing, or sneezing.  If soap and water are not readily available, use a hand sanitizer that contains at least 60% alcohol. Cover all surfaces of your hands and rub them together until they feel dry.  Avoid touching your eyes, nose, and mouth with unwashed hands. Avoid close contact  Stay  home if you are sick.  Avoid close contact with people who are sick.  Put distance between yourself and other people. ? Remember that some people without symptoms may be able to spread virus. ? This is especially important for people who are at higher risk of getting very GainPain.com.cy Cover your mouth and nose with a cloth face cover when around others  You could spread COVID-19 to others even if you do not feel sick.  Everyone should wear a cloth face cover when they have to go out in public, for example to the grocery store or to pick up other necessities. ? Cloth face coverings should not be placed on young children under age 41, anyone who has trouble breathing, or is unconscious, incapacitated or otherwise unable to remove the mask without assistance.  The cloth face cover is meant to protect other people in case you are infected.  Do NOT use a facemask meant for a Dietitian.  Continue to keep about 6 feet between yourself and others. The cloth face cover is not a substitute for social distancing. Cover coughs and sneezes  If you are in a private setting and do not have on your cloth face covering, remember to always cover your mouth and nose with a tissue when you cough or sneeze or use the inside of your elbow.  Throw used tissues in the trash.  Immediately wash your hands with soap and water for at least 20 seconds. If soap and water are not readily available, clean your hands with a hand sanitizer that contains at least 60% alcohol. Clean and disinfect  Clean AND disinfect frequently touched surfaces daily. This includes tables, doorknobs, light switches, countertops, handles, desks, phones, keyboards, toilets, faucets, and sinks. RackRewards.fr  If surfaces are dirty, clean them: Use detergent or soap and water prior to  disinfection.  Then, use a household disinfectant. You can see a list of EPA-registered household disinfectants here. michellinders.com 06/15/2018 This information is not intended to replace advice given to you by your health care provider. Make sure you discuss any questions you have with your health care provider. Document Released: 05/25/2018 Document Revised: 06/23/2018 Document Reviewed: 05/25/2018 Elsevier Patient Education  2020 [employer]. COVID-19 Frequently Asked Questions COVID-19 (coronavirus disease) is an infection that is caused by a large family of viruses. Some viruses cause illness in people and others cause illness in animals like camels, cats, and bats. In some cases, the viruses that cause illness in animals can spread to humans. Where did the coronavirus come from? In December 2019, [place] told the [facility]) of several cases of lung disease (human respiratory illness). These cases were linked to an open seafood and livestock market in the city of [place]. The link to the seafood and livestock market suggests that the virus may have spread from animals to humans. However, since that first outbreak in December, the virus has also been shown to spread from person to person. What is the name of the disease and the virus? Disease name Early on, this disease was called novel coronavirus. This is because scientists determined that the disease was caused by a new (novel) respiratory virus. The World Health Organization [facility]) has now named the disease COVID-19, or coronavirus disease. Virus name The virus that causes the disease is called severe acute respiratory syndrome coronavirus 2 (SARS-CoV-2). More information on disease and virus naming World Health Organization [facility]): www.who.int/emergencies/diseases/novel-coronavirus-2019/technical-guidance/naming-the-coronavirus-disease-(covid-2019)-and-the-virus-that-causes-it Who is at risk for complications from  coronavirus disease? Some people may be at higher risk for complications from coronavirus disease. This includes older adults and people who have chronic diseases, such as heart disease, diabetes, and lung disease. If you are at higher risk for complications, take these extra precautions:  Avoid close contact with people who are sick or have a fever or cough. Stay at least 3-6 ft (1-2 m) away from them, if possible.  Wash your hands often with soap and water for at least 20 seconds.  Avoid touching your face, mouth, nose, or eyes.  Keep supplies on hand at home, such as food, medicine, and cleaning supplies.  Stay home as much as possible.  Avoid social gatherings and travel. How does coronavirus disease spread? The virus that causes coronavirus disease spreads easily from person to person (is contagious). There are also cases of community-spread disease. This means the disease has spread to:  People who have no known contact with other infected people.  People who have not traveled to areas where there are known cases. It appears to spread from one person to another through droplets from coughing or sneezing. Can I get  the virus from touching surfaces or objects? There is still a lot that we do not know about the virus that causes coronavirus disease. Scientists are basing a lot of information on what they know about similar viruses, such as:  Viruses cannot generally survive on surfaces for long. They need a human body (host) to survive.  It is more likely that the virus is spread by close contact with people who are sick (direct contact), such as through: ? Shaking hands or hugging. ? Breathing in respiratory droplets that travel through the air. This can happen when an infected person coughs or sneezes on or near other people.  It is less likely that the virus is spread when a person touches a surface or object that has the virus on it (indirect contact). The virus may be able to  enter the body if the person touches a surface or object and then touches his or her face, eyes, nose, or mouth. Can a person spread the virus without having symptoms of the disease? It may be possible for the virus to spread before a person has symptoms of the disease, but this is most likely not the main way the virus is spreading. It is more likely for the virus to spread by being in close contact with people who are sick and breathing in the respiratory droplets of a sick person's cough or sneeze. What are the symptoms of coronavirus disease? Symptoms vary from person to person and can range from mild to severe. Symptoms may include:  Fever.  Cough.  Tiredness, weakness, or fatigue.  Fast breathing or feeling short of breath. These symptoms can appear anywhere from 2 to 14 days after you have been exposed to the virus. If you develop symptoms, call your health care provider. People with severe symptoms may need hospital care. If I am exposed to the virus, how long does it take before symptoms start? Symptoms of coronavirus disease may appear anywhere from 2 to 14 days after a person has been exposed to the virus. If you develop symptoms, call your health care provider. Should I be tested for this virus? Your health care provider will decide whether to test you based on your symptoms, history of exposure, and your risk factors. How does a health care provider test for this virus? Health care providers will collect samples to send for testing. Samples may include:  Taking a swab of fluid from the nose.  Taking fluid from the lungs by having you cough up mucus (sputum) into a sterile cup.  Taking a blood sample.  Taking a stool or urine sample. Is there a treatment or vaccine for this virus? Currently, there is no vaccine to prevent coronavirus disease. Also, there are no medicines like antibiotics or antivirals to treat the virus. A person who becomes sick is given supportive care,  which means rest and fluids. A person may also relieve his or her symptoms by using over-the-counter medicines that treat sneezing, coughing, and runny nose. These are the same medicines that a person takes for the common cold. If you develop symptoms, call your health care provider. People with severe symptoms may need hospital care. What can I do to protect myself and my family from this virus?     You can protect yourself and your family by taking the same actions that you would take to prevent the spread of other viruses. Take the following actions:  Wash your hands often with soap and water for at least  20 seconds. If soap and water are not available, use alcohol-based hand sanitizer.  Avoid touching your face, mouth, nose, or eyes.  Cough or sneeze into a tissue, sleeve, or elbow. Do not cough or sneeze into your hand or the air. ? If you cough or sneeze into a tissue, throw it away immediately and wash your hands.  Disinfect objects and surfaces that you frequently touch every day.  Avoid close contact with people who are sick or have a fever or cough. Stay at least 3-6 ft (1-2 m) away from them, if possible.  Stay home if you are sick, except to get medical care. Call your health care provider before you get medical care.  Make sure your vaccines are up to date. Ask your health care provider what vaccines you need. What should I do if I need to travel? Follow travel recommendations from your local health authority, the CDC, and WHO. Travel information and advice  Centers for Disease Control and Prevention (CDC): BodyEditor.hu  World Health Organization [facility]): ThirdIncome.ca Know the risks and take action to protect your health  You are at higher risk of getting coronavirus disease if you are traveling to areas with an outbreak or if you are exposed to travelers from areas with an  outbreak.  Wash your hands often and practice good hygiene to lower the risk of catching or spreading the virus. What should I do if I am sick? General instructions to stop the spread of infection  Wash your hands often with soap and water for at least 20 seconds. If soap and water are not available, use alcohol-based hand sanitizer.  Cough or sneeze into a tissue, sleeve, or elbow. Do not cough or sneeze into your hand or the air.  If you cough or sneeze into a tissue, throw it away immediately and wash your hands.  Stay home unless you must get medical care. Call your health care provider or local health authority before you get medical care.  Avoid public areas. Do not take public transportation, if possible.  If you can, wear a mask if you must go out of the house or if you are in close contact with someone who is not sick. Keep your home clean  Disinfect objects and surfaces that are frequently touched every day. This may include: ? Counters and tables. ? Doorknobs and light switches. ? Sinks and faucets. ? Electronics such as phones, remote controls, keyboards, computers, and tablets.  Wash dishes in hot, soapy water or use a dishwasher. Air-dry your dishes.  Wash laundry in hot water. Prevent infecting other household members  Let healthy household members care for children and pets, if possible. If you have to care for children or pets, wash your hands often and wear a mask.  Sleep in a different bedroom or bed, if possible.  Do not share personal items, such as razors, toothbrushes, deodorant, combs, brushes, towels, and washcloths. Where to find more information Centers for Disease Control and Prevention (CDC)  Information and news updates: https://www.butler-gonzalez.com/ World Health Organization [facility])  Information and news updates: MissExecutive.com.ee  Coronavirus health topic: https://www.castaneda.info/   Questions and answers on COVID-19: OpportunityDebt.at  Global tracker: who.sprinklr.com American Academy of Pediatrics (AAP)  Information for families: www.healthychildren.org/English/health-issues/conditions/chest-lungs/Pages/2019-Novel-Coronavirus.aspx The coronavirus situation is changing rapidly. Check your local health authority website or the CDC and [employer] websites for updates and news. When should I contact a health care provider?  Contact your health care provider if you have symptoms of an infection,  such as fever or cough, and you: ? Have been near anyone who is known to have coronavirus disease. ? Have come into contact with a person who is suspected to have coronavirus disease. ? Have traveled outside of the country. When should I get emergency medical care?  Get help right away by calling your local emergency services (911 in the [place] if you have: ? Trouble breathing. ? Pain or pressure in your chest. ? Confusion. ? Blue-tinged lips and fingernails. ? Difficulty waking from sleep. ? Symptoms that get worse. Let the emergency medical personnel know if you think you have coronavirus disease. Summary  A new respiratory virus is spreading from person to person and causing COVID-19 (coronavirus disease).  The virus that causes COVID-19 appears to spread easily. It spreads from one person to another through droplets from coughing or sneezing.  Older adults and those with chronic diseases are at higher risk of disease. If you are at higher risk for complications, take extra precautions.  There is currently no vaccine to prevent coronavirus disease. There are no medicines, such as antibiotics or antivirals, to treat the virus.  You can protect yourself and your family by washing your hands often, avoiding touching your face, and covering your coughs and sneezes. This information is not intended to replace advice given to you by your health care  provider. Make sure you discuss any questions you have with your health care provider. Document Released: 05/25/2018 Document Revised: 05/25/2018 Document Reviewed: 05/25/2018 Elsevier Patient Education  [place].

## 2018-08-12 ENCOUNTER — Other Ambulatory Visit: Payer: Self-pay | Admitting: Family Medicine

## 2018-08-12 ENCOUNTER — Other Ambulatory Visit: Payer: Self-pay

## 2018-08-12 DIAGNOSIS — Z20822 Contact with and (suspected) exposure to covid-19: Secondary | ICD-10-CM

## 2018-08-16 LAB — CYTOLOGY, URINE

## 2018-08-18 LAB — NOVEL CORONAVIRUS, NAA: SARS-CoV-2, NAA: NOT DETECTED

## 2018-08-19 ENCOUNTER — Other Ambulatory Visit: Payer: Self-pay | Admitting: Obstetrics and Gynecology

## 2018-08-19 ENCOUNTER — Telehealth: Payer: Self-pay | Admitting: Adult Health

## 2018-08-19 ENCOUNTER — Encounter: Payer: Self-pay | Admitting: Adult Health

## 2018-08-19 DIAGNOSIS — N921 Excessive and frequent menstruation with irregular cycle: Secondary | ICD-10-CM

## 2018-08-19 NOTE — Telephone Encounter (Signed)
Patient calls the clinic she started with mild congestion and scratchy throat in the morning of 08/17/18.  She had a negative Covid test resulted yesterday 08/18/18.She has also stopped her singular and allergy medications, this is likely the cause given negative test but will give her until Monday out, restart allergy medications.  She is not having any other symptoms. She will stay out of work until 08/23/18 Monday, New work note sent in [place]. Advised to call the clinic on monday if she has new symptoms for further evaluation.  Seek emergency medical care at anytime if any symptoms worsen after hours or at anytime.  Advised patient call the office or your primary care doctor for an appointment if no improvement within 72 hours or if any symptoms change or worsen at any time  Advised ER or urgent Care if after hours or on weekend. Call 911 for emergency symptoms at any time.Patinet verbalized understanding of all instructions given/reviewed and treatment plan and has no further questions or concerns at this time.

## 2018-08-20 ENCOUNTER — Ambulatory Visit (INDEPENDENT_AMBULATORY_CARE_PROVIDER_SITE_OTHER): Payer: Managed Care, Other (non HMO)

## 2018-08-20 ENCOUNTER — Other Ambulatory Visit: Payer: Self-pay

## 2018-08-20 DIAGNOSIS — I10 Essential (primary) hypertension: Secondary | ICD-10-CM

## 2018-08-20 DIAGNOSIS — Z3042 Encounter for surveillance of injectable contraceptive: Secondary | ICD-10-CM

## 2018-08-20 MED ORDER — MEDROXYPROGESTERONE ACETATE 150 MG/ML IM SUSP
150.0000 mg | Freq: Once | INTRAMUSCULAR | Status: AC
  Administered 2018-08-20: 150 mg via INTRAMUSCULAR

## 2018-08-20 NOTE — Progress Notes (Signed)
Patient is here for her depo injection. Her last injection was done on 05/21/2018.  Her next injection will be due Sept 25- Oct. 9. She tolerated injection well and was informed of next due date of Sept 25- Oct. 9.

## 2018-09-01 ENCOUNTER — Encounter: Payer: Self-pay | Admitting: Family Medicine

## 2018-09-02 ENCOUNTER — Ambulatory Visit (INDEPENDENT_AMBULATORY_CARE_PROVIDER_SITE_OTHER)
Admission: RE | Admit: 2018-09-02 | Discharge: 2018-09-02 | Disposition: A | Discharge status: Home / Self Care | Payer: Managed Care, Other (non HMO) | Source: Ambulatory Visit

## 2018-09-02 DIAGNOSIS — N898 Other specified noninflammatory disorders of vagina: Secondary | ICD-10-CM | POA: Diagnosis not present

## 2018-09-02 DIAGNOSIS — N760 Acute vaginitis: Secondary | ICD-10-CM

## 2018-09-02 DIAGNOSIS — B9689 Other specified bacterial agents as the cause of diseases classified elsewhere: Secondary | ICD-10-CM | POA: Diagnosis not present

## 2018-09-02 MED ORDER — METRONIDAZOLE 500 MG PO TABS: 500.0000 mg | ORAL_TABLET | Freq: Two times a day (BID) | ORAL | 0 refills | Status: DC
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
## 2018-09-02 NOTE — Discharge Instructions (Signed)
Prescribed metronidazole 500 mg twice daily for 7 days (do not take while consuming alcohol and/or if breastfeeding) Follow up in person if symptoms persists Follow up in person or go to ER if you have any new or worsening symptoms fever, chills, nausea, vomiting, abdominal or pelvic pain, painful intercourse, vaginal discharge, vaginal bleeding, persistent symptoms despite treatment, etc..Marland Kitchen

## 2018-09-23 ENCOUNTER — Other Ambulatory Visit: Payer: Self-pay | Admitting: Family Medicine

## 2018-09-23 DIAGNOSIS — E559 Vitamin D deficiency, unspecified: Secondary | ICD-10-CM

## 2018-10-19 ENCOUNTER — Other Ambulatory Visit: Payer: Self-pay | Admitting: Obstetrics and Gynecology

## 2018-10-21 ENCOUNTER — Ambulatory Visit: Payer: Managed Care, Other (non HMO)

## 2018-10-21 ENCOUNTER — Other Ambulatory Visit: Payer: Self-pay

## 2018-10-21 DIAGNOSIS — Z3042 Encounter for surveillance of injectable contraceptive: Secondary | ICD-10-CM

## 2018-10-21 NOTE — Progress Notes (Signed)
Patient is here for her depo injection. Her last injection was done on 08/20/2018. She was due for her next injection between  She is overdue for her injection. Urine pregnancy test was collected and the results were. Her next injection will be due . She tolerated injection well and was informed of next due date of .   Error encounter

## 2018-10-27 ENCOUNTER — Other Ambulatory Visit: Payer: Self-pay | Admitting: Family Medicine

## 2018-10-27 DIAGNOSIS — I10 Essential (primary) hypertension: Secondary | ICD-10-CM

## 2018-11-09 ENCOUNTER — Other Ambulatory Visit: Payer: Self-pay | Admitting: Family Medicine

## 2018-11-09 DIAGNOSIS — E559 Vitamin D deficiency, unspecified: Secondary | ICD-10-CM

## 2018-11-09 NOTE — Telephone Encounter (Signed)
Requested medication (s) are due for refill today: yes  Requested medication (s) are on the active medication list: yes  Last refill:  08/22/2018  Future visit scheduled: no  Notes to clinic:  Refill cannot be delegated    Requested Prescriptions  Pending Prescriptions Disp Refills   Vitamin D, Ergocalciferol, (DRISDOL) 1.25 MG (50000 UT) CAPS capsule [Pharmacy Med Name: VITAMIN D2 1.25MG (50,000 UNIT)] 12 capsule 0    Sig: TAKE 1 CAPSULE (50,000 UNITS TOTAL) BY MOUTH EVERY 7 (SEVEN) DAYS.     Endocrinology:  Vitamins - Vitamin D Supplementation Failed - 11/09/2018  1:23 AM      Failed - 50,000 IU strengths are not delegated      Failed - Phosphate in normal range and within 360 days    Phosphorus  Date Value Ref Range Status  07/08/2016 4.2 2.5 - 4.5 mg/dL Final         Failed - Vitamin D in normal range and within 360 days    Vit D, 25-Hydroxy  Date Value Ref Range Status  07/20/2018 20.8 (L) 30.0 - 100.0 ng/mL Final    Comment:    Vitamin D deficiency has been defined by the Institute of Medicine and an Endocrine Society practice guideline as a level of serum 25-OH vitamin D less than 20 ng/mL (1,2). The Endocrine Society went on to further define vitamin D insufficiency as a level between 21 and 29 ng/mL (2). 1. IOM (Institute of Medicine). 2010. Dietary reference    intakes for calcium and D. [place]: The    [employer]. 2. Holick MF, Binkley [place], Bischoff-Ferrari HA, et al.    Evaluation, treatment, and prevention of vitamin D    deficiency: an Endocrine Society clinical practice    guideline. JCEM. 2011 Jul; 96(7):1911-30.          Passed - Ca in normal range and within 360 days    Calcium  Date Value Ref Range Status  07/20/2018 9.2 8.7 - 10.2 mg/dL Final         Passed - Valid encounter within last 12 months    Recent Outpatient Visits          3 months ago Well woman exam with routine gynecological exam   [facility]  Steele Sizer, MD   5 months ago Hypertension, benign   [facility] Steele Sizer, MD   6 months ago Morbid obesity [facility])   [facility] Steele Sizer, MD   1 year ago Well woman exam with routine gynecological exam   [facility] Steele Sizer, MD   1 year ago Encounter for surveillance of injectable contraceptive   [facility] Steele Sizer, MD

## 2018-11-19 ENCOUNTER — Other Ambulatory Visit: Payer: Self-pay

## 2018-11-19 ENCOUNTER — Ambulatory Visit (INDEPENDENT_AMBULATORY_CARE_PROVIDER_SITE_OTHER): Payer: Managed Care, Other (non HMO)

## 2018-11-19 DIAGNOSIS — Z3042 Encounter for surveillance of injectable contraceptive: Secondary | ICD-10-CM

## 2018-11-19 MED ORDER — MEDROXYPROGESTERONE ACETATE 150 MG/ML IM SUSP
150.0000 mg | Freq: Once | INTRAMUSCULAR | Status: AC
  Administered 2018-11-19: 150 mg via INTRAMUSCULAR

## 2018-11-19 NOTE — Progress Notes (Signed)
Date last pap: 07/13/2018. Last Depo-Provera: 08/20/2018 Side Effects if any: N/A Serum HCG indicated? N/A Depo-Provera 150 mg IM given by: Vonna Kotyk, CMA  Next appointment due February 07, 2019 -February 18, 2019

## 2019-01-25 ENCOUNTER — Other Ambulatory Visit: Payer: Self-pay | Admitting: Family Medicine

## 2019-01-25 DIAGNOSIS — I10 Essential (primary) hypertension: Secondary | ICD-10-CM

## 2019-02-09 ENCOUNTER — Other Ambulatory Visit: Payer: Self-pay | Admitting: Family Medicine

## 2019-02-09 DIAGNOSIS — E559 Vitamin D deficiency, unspecified: Secondary | ICD-10-CM

## 2019-02-18 ENCOUNTER — Ambulatory Visit (INDEPENDENT_AMBULATORY_CARE_PROVIDER_SITE_OTHER): Payer: Managed Care, Other (non HMO)

## 2019-02-18 ENCOUNTER — Other Ambulatory Visit: Payer: Self-pay

## 2019-02-18 DIAGNOSIS — Z3042 Encounter for surveillance of injectable contraceptive: Secondary | ICD-10-CM

## 2019-02-18 MED ORDER — MEDROXYPROGESTERONE ACETATE 150 MG/ML IM SUSP
150.0000 mg | INTRAMUSCULAR | Status: DC
  Administered 2019-02-18 – 2019-08-10 (×2): 150 mg via INTRAMUSCULAR

## 2019-02-18 NOTE — Progress Notes (Signed)
Patient is here for her depo injection. Her last injection was done on 11/19/2018. She was due for her next injection between Dec.25-Jan 8. Her next injection will be due March 26-April 9. She tolerated injection well and was informed of next due date of April 9.Marland Kitchen

## 2019-03-23 ENCOUNTER — Ambulatory Visit: Payer: Managed Care, Other (non HMO) | Admitting: Internal Medicine

## 2019-04-26 ENCOUNTER — Other Ambulatory Visit: Payer: Self-pay | Admitting: Family Medicine

## 2019-04-26 DIAGNOSIS — I10 Essential (primary) hypertension: Secondary | ICD-10-CM

## 2019-04-26 NOTE — Telephone Encounter (Signed)
Requested Prescriptions  Pending Prescriptions Disp Refills  . valsartan-hydrochlorothiazide (DIOVAN-HCT) 160-25 MG tablet [Pharmacy Med Name: VALSARTAN-HCTZ 160-25 MG TAB] 30 tablet 0    Sig: TAKE 1 TABLET BY MOUTH EVERY DAY     Cardiovascular: ARB + Diuretic Combos Failed - 04/26/2019  1:30 AM      Failed - K in normal range and within 180 days    Potassium  Date Value Ref Range Status  07/20/2018 3.9 3.5 - 5.2 mmol/L Final         Failed - Na in normal range and within 180 days    Sodium  Date Value Ref Range Status  07/20/2018 137 134 - 144 mmol/L Final         Failed - Cr in normal range and within 180 days    Creatinine, Ser  Date Value Ref Range Status  07/20/2018 0.68 0.57 - 1.00 mg/dL Final         Failed - Ca in normal range and within 180 days    Calcium  Date Value Ref Range Status  07/20/2018 9.2 8.7 - 10.2 mg/dL Final         Failed - Valid encounter within last 6 months    Recent Outpatient Visits          9 months ago Well woman exam with routine gynecological exam   [facility] Steele Sizer, MD   11 months ago Hypertension, benign   [facility] Steele Sizer, MD   1 year ago Morbid obesity [facility])   [facility] Steele Sizer, MD   1 year ago Well woman exam with routine gynecological exam   [facility] Steele Sizer, MD   2 years ago Encounter for surveillance of injectable contraceptive   [facility] [place], Drue Stager, MD             Passed - Patient is not pregnant      Passed - Last BP in normal range    BP Readings from Last 1 Encounters:  07/13/18 120/80

## 2019-05-02 ENCOUNTER — Encounter: Payer: Self-pay | Admitting: Podiatry

## 2019-05-02 ENCOUNTER — Other Ambulatory Visit: Payer: Self-pay | Admitting: Podiatry

## 2019-05-02 ENCOUNTER — Ambulatory Visit (INDEPENDENT_AMBULATORY_CARE_PROVIDER_SITE_OTHER): Payer: No Typology Code available for payment source

## 2019-05-02 ENCOUNTER — Ambulatory Visit (INDEPENDENT_AMBULATORY_CARE_PROVIDER_SITE_OTHER): Payer: No Typology Code available for payment source | Admitting: Podiatry

## 2019-05-02 ENCOUNTER — Other Ambulatory Visit: Payer: Self-pay

## 2019-05-02 VITALS — BP 106/79 | HR 89 | Temp 98.3°F | Resp 16

## 2019-05-02 DIAGNOSIS — M7751 Other enthesopathy of right foot: Secondary | ICD-10-CM

## 2019-05-02 DIAGNOSIS — M779 Enthesopathy, unspecified: Secondary | ICD-10-CM

## 2019-05-02 DIAGNOSIS — T148XXA Other injury of unspecified body region, initial encounter: Secondary | ICD-10-CM | POA: Diagnosis not present

## 2019-05-02 DIAGNOSIS — M25571 Pain in right ankle and joints of right foot: Secondary | ICD-10-CM

## 2019-05-02 NOTE — Progress Notes (Signed)
   Subjective:    Patient ID: Anita Newton, female    DOB: 1980-05-16, 39 y.o.   MRN: [id number]  HPI    Review of Systems  All other systems reviewed and are negative.      Objective:   Physical Exam        Assessment & Plan:

## 2019-05-03 NOTE — Progress Notes (Signed)
Subjective:   Patient ID: Anita Newton, female   DOB: 39 y.o.   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  Patient states that this occurred on February 9 and is been making it very difficult for her to weight-bear   Review of Systems  All other systems reviewed and are negative.       Objective:  Physical Exam Vitals and nursing note reviewed.  Constitutional:      Appearance: She is well-developed.  Pulmonary:     Effort: Pulmonary effort is normal.  Musculoskeletal:        General: Normal range of motion.  Skin:    General: Skin is warm.  Neurological:     Mental Status: She is alert.     Neurovascular status intact muscle strength was found to be adequate with patient noted to have moderate weakness of the peroneal tendon group right that is difficult to say as to whether or not there may be damage to the tendon or possible splinting.  Quite a bit of discomfort in the sinus tarsi also noted and inability to bear weight on the right foot currently     Assessment:  Possibility for tear of the peroneal tendon right secondary to injury versus inflammatory condition or possible acute sinus tarsitis     Plan:  H&P reviewed condition and I have recommended that we go ahead do sterile prep and I did inject the sinus tarsi right 3 mg Kenalog 5 mg Xylocaine and applied air fracture walker for immobilization.  I recommended MRI due to the intensity of her discomfort to rule out peroneal tendon or possible lateral ligament ankle damage or other pathology  X-rays were negative for signs of fracture or bone pathology currently

## 2019-05-11 ENCOUNTER — Encounter: Payer: Self-pay | Admitting: Podiatry

## 2019-05-12 ENCOUNTER — Telehealth: Payer: Self-pay | Admitting: *Deleted

## 2019-05-12 DIAGNOSIS — T148XXA Other injury of unspecified body region, initial encounter: Secondary | ICD-10-CM

## 2019-05-12 DIAGNOSIS — M25571 Pain in right ankle and joints of right foot: Secondary | ICD-10-CM

## 2019-05-12 DIAGNOSIS — M779 Enthesopathy, unspecified: Secondary | ICD-10-CM

## 2019-05-12 NOTE — Telephone Encounter (Signed)
Faxed orders for right ankle MRI to Attn:  Beecher Mcardle for Claim:  [id number], location will be left as External until I am contacted with the Workers' Comp accepted location.

## 2019-05-12 NOTE — Telephone Encounter (Signed)
05/02/2019 - Dr. Paulla Dolly ordered MRI right ankle suspect peroneal tendon tear. 05/11/2019 - A. Tamala Julian, Referral Coordinator Head states [employer]ec[facility] [employer] - Workers' [place] [phone] for [employer]: [id number], request orders to be faxed to [phone].

## 2019-05-16 NOTE — Telephone Encounter (Signed)
I informed C. Nicola Girt - Workers' Comp case worker I had been unable to fax to the given fax number. Leota Jacobsen states the faxes were off-line in some cases last week, refax again. Faxed 05/12/2019 fax with orders to the [phone] and it was confirmed received.

## 2019-05-17 ENCOUNTER — Other Ambulatory Visit: Payer: Self-pay

## 2019-05-17 ENCOUNTER — Ambulatory Visit (INDEPENDENT_AMBULATORY_CARE_PROVIDER_SITE_OTHER): Payer: Managed Care, Other (non HMO)

## 2019-05-17 DIAGNOSIS — Z3042 Encounter for surveillance of injectable contraceptive: Secondary | ICD-10-CM

## 2019-05-17 NOTE — Progress Notes (Signed)
Date last pap: 07/10/2017 Last Depo-Provera: 02/18/2019 Side Effects if any: None Serum HCG indicated? No Depo-Provera 150 mg IM given by: Cristy Folks, CMA Next appointment due: June 22- July 6

## 2019-05-23 ENCOUNTER — Other Ambulatory Visit: Payer: Self-pay | Admitting: Family Medicine

## 2019-05-23 DIAGNOSIS — I10 Essential (primary) hypertension: Secondary | ICD-10-CM

## 2019-06-15 ENCOUNTER — Other Ambulatory Visit: Payer: Self-pay | Admitting: Family Medicine

## 2019-06-15 DIAGNOSIS — I10 Essential (primary) hypertension: Secondary | ICD-10-CM

## 2019-06-15 NOTE — Telephone Encounter (Signed)
Requested medications are due for refill today?  Yes  Requested medications are on active medication list? Yes  Last Refill:  05/23/2019  # 30 with no refills   Future visit scheduled?  Yes  Notes to Clinic:  Medication failed RX refill protocol due to no valid encounter in the last 6 months and no lab work in the past 180 days.  Patient's last visit was 11 months ago.

## 2019-07-14 ENCOUNTER — Other Ambulatory Visit: Payer: Self-pay | Admitting: Obstetrics and Gynecology

## 2019-07-14 DIAGNOSIS — N921 Excessive and frequent menstruation with irregular cycle: Secondary | ICD-10-CM

## 2019-07-15 ENCOUNTER — Encounter: Payer: Self-pay | Admitting: Family Medicine

## 2019-07-15 ENCOUNTER — Ambulatory Visit (INDEPENDENT_AMBULATORY_CARE_PROVIDER_SITE_OTHER): Payer: Managed Care, Other (non HMO) | Admitting: Family Medicine

## 2019-07-15 ENCOUNTER — Other Ambulatory Visit (HOSPITAL_COMMUNITY)
Admission: RE | Admit: 2019-07-15 | Discharge: 2019-07-15 | Disposition: A | Discharge status: Home / Self Care | Payer: Managed Care, Other (non HMO) | Source: Ambulatory Visit | Attending: Family Medicine | Admitting: Family Medicine

## 2019-07-15 ENCOUNTER — Other Ambulatory Visit: Payer: Self-pay

## 2019-07-15 VITALS — BP 124/86 | HR 93 | Temp 97.8°F | Resp 16 | Ht 60.0 in | Wt 207.1 lb

## 2019-07-15 DIAGNOSIS — Z0000 Encounter for general adult medical examination without abnormal findings: Secondary | ICD-10-CM | POA: Diagnosis not present

## 2019-07-15 DIAGNOSIS — Z113 Encounter for screening for infections with a predominantly sexual mode of transmission: Secondary | ICD-10-CM

## 2019-07-15 DIAGNOSIS — Z1231 Encounter for screening mammogram for malignant neoplasm of breast: Secondary | ICD-10-CM | POA: Diagnosis not present

## 2019-07-15 DIAGNOSIS — Z6841 Body Mass Index (BMI) 40.0 and over, adult: Secondary | ICD-10-CM

## 2019-07-15 DIAGNOSIS — Z124 Encounter for screening for malignant neoplasm of cervix: Secondary | ICD-10-CM

## 2019-07-15 DIAGNOSIS — E538 Deficiency of other specified B group vitamins: Secondary | ICD-10-CM

## 2019-07-15 DIAGNOSIS — E559 Vitamin D deficiency, unspecified: Secondary | ICD-10-CM

## 2019-07-15 DIAGNOSIS — I10 Essential (primary) hypertension: Secondary | ICD-10-CM

## 2019-07-15 MED ORDER — VITAMIN D (ERGOCALCIFEROL) 1.25 MG (50000 UNIT) PO CAPS: 50000.0000 [IU] | ORAL_CAPSULE | ORAL | 1 refills | Status: DC

## 2019-07-15 MED ORDER — B-12 1000 MCG SL SUBL: 1.0000 | SUBLINGUAL_TABLET | Freq: Every day | SUBLINGUAL | 1 refills | Status: AC

## 2019-07-15 MED ORDER — VALSARTAN-HYDROCHLOROTHIAZIDE 160-25 MG PO TABS: 1.0000 | ORAL_TABLET | Freq: Every day | ORAL | 1 refills | Status: DC

## 2019-07-15 NOTE — Progress Notes (Signed)
Name: Anita Newton   MRN: [id number]    DOB: [birth year]   Date:07/15/2019       Progress Note  Subjective  Chief Complaint  Chief Complaint  Patient presents with  . Annual Exam  . Follow-up    HPI  Patient presents for annual CPE.  HTN: bp is at goal, no chest pain , palpitation or sob. 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 She states eats one full meal daily and snacks on fruit, protein snack packs, no food after 9 pm, she drinking sweet tea a couple of times a day but usually drinks water, discussed balanced meals, consider water activities  Diet: she eats a balanced diet  Exercise: not currently due to ankle injury   USPSTF grade A and B recommendations    Office Visit from 07/15/2019 in [facility]  AUDIT-C Score  1     Depression: Phq 9 is  negative Depression screen [facility] 2/9 07/15/2019 07/13/2018 05/21/2018 04/16/2018 07/10/2017  Decreased Interest 0 0 0 0 0  Down, Depressed, Hopeless 0 0 0 0 1  PHQ - 2 Score 0 0 0 0 1  Altered sleeping 2 0 _0 Tired, decreased energy 0 0 _1 Change in appetite 0 0 0 0 3  Feeling bad or failure about yourself  0 0 0 0 0  Trouble concentrating 0 0 0 0 0  Moving slowly or fidgety/restless 0 0 0 0 0  Suicidal thoughts 0 0 0 0 0  PHQ-9 Score 2 0 _2 Difficult doing work/chores Not difficult at all - Not difficult at all Somewhat difficult Not difficult at all   Hypertension: BP Readings from Last 3 Encounters:  07/15/19 124/86  05/02/19 106/79  07/13/18 120/80   Obesity: Wt Readings from Last 3 Encounters:  07/15/19 207 lb 1.6 oz (93.9 kg)  07/13/18 196 lb 1.6 oz (89 kg)  05/21/18 192 lb 14.4 oz (87.5 kg)   BMI Readings from Last 3 Encounters:   07/15/19 40.45 kg/m  07/13/18 38.30 kg/m  05/21/18 37.67 kg/m     Hep C Screening: 2019  STD testing and prevention (HIV/chl/gon/syphilis): today  Intimate partner violence:negative screen  Sexual History (Partners/Practices/Protection from [employer] hx STI/Pregnancy Plans): no plans on getting pregnant, she is on Depo and ocp - given by gyn, uses condoms, new sexual partner since pap smear, together for about one year  Pain during Intercourse: no pain  Menstrual History/LMP/Abnormal Bleeding: she states since on combo of depo and ocp, she only has bleeding for 3 days only every 3 months.  Incontinence Symptoms: none   Breast cancer:  - Last Mammogram: - BRCA gene screening: N/A  Osteoporosis: Discussed high calcium and vitamin D supplementation, weight bearing exercises  Cervical cancer screening: 2019   Skin cancer: Discussed monitoring for atypical lesions  Colorectal cancer: start at age 28    Lung cancer:   Low Dose CT Chest recommended if Age 75-80 years, 30 pack-year currently smoking OR have quit w/in 15years. Patient does not qualify.   ECG: 2016   Advanced Care Planning: A voluntary discussion about advance care planning including  the explanation and discussion of advance directives.  Discussed health care proxy and Living will, and the patient was able to identify a health care proxy as Marylou Flesher - her oldest daughter   Patient does not have a living will at present time.  Lipids: Lab Results  Component Value Date   CHOL 150 07/20/2018   CHOL 160 07/10/2017   CHOL 165 07/08/2016   Lab Results  Component Value Date   HDL 48 07/20/2018   HDL 47 07/10/2017   HDL 42 07/08/2016   Lab Results  Component Value Date   LDLCALC 84 07/20/2018   LDLCALC 103 (H) 07/10/2017   LDLCALC 103 (H) 07/08/2016   Lab Results  Component Value Date   TRIG 92 07/20/2018   TRIG 48 07/10/2017   TRIG 101 07/08/2016   Lab Results  Component Value Date   CHOLHDL 3.4 07/10/2017    CHOLHDL 3.9 07/08/2016   CHOLHDL 3.1 12/20/2014   No results found for: LDLDIRECT  Glucose: Glucose  Date Value Ref Range Status  07/20/2018 93 65 - 99 mg/dL Final  07/10/2017 91 65 - 99 mg/dL Final  07/08/2016 100 (H) 65 - 99 mg/dL Final   Glucose, Bld  Date Value Ref Range Status  01/28/2015 94 65 - 99 mg/dL Final    Patient Active Problem List   Diagnosis Date Noted  . Morbid obesity ([place]) 04/16/2018  . Encounter for surveillance of injectable contraceptive 02/23/2018  . DUB (dysfunctional uterine bleeding) 07/16/2017  . B12 deficiency 07/08/2016  . History of iron deficiency anemia 07/08/2016  . Seasonal allergic rhinitis 05/16/2015  . History of eclampsia 12/15/2014  . Hypertension, benign 12/15/2014  . Large breasts 12/15/2014  . Obesity (BMI 35.0-39.9 without comorbidity) 12/15/2014  . Thoracic back pain 12/15/2014  . Vitamin D deficiency 12/15/2014    Past Surgical History:  Procedure Laterality Date  . DILATATION & CURETTAGE/HYSTEROSCOPY WITH MYOSURE N/A 10/06/2017   Procedure: DILATATION & CURETTAGE /HYSTEROSCOPY;  Surgeon: Homero Fellers, MD;  Location: [facility] ORS;  Service: Gynecology;  Laterality: N/A;  . EYE SURGERY    . TUBAL LIGATION      Family History  Problem Relation Age of Onset  . Cancer Mother        eye tumor  . Diabetes Mother   . Lupus Mother   . Alcohol abuse Father   . Drug abuse Father   . Eczema Daughter   . ADD / ADHD Daughter   . ADD / ADHD Son   . Allergic Disorder Son   . Lupus Sister   . Depression Sister   . Hypertension Brother     Social History   Socioeconomic History  . Marital status: Single    Spouse name: Not on file  . Number of children: 3  . Years of education: Not on file  . Highest education level: Some college, no degree  Occupational History  . Not on file  Tobacco Use  . Smoking status: Never Smoker  . Smokeless tobacco: Never Used  Substance and Sexual Activity  . Alcohol use: No     Alcohol/week: 0.0 standard drinks  . Drug use: No  . Sexual activity: Yes    Birth control/protection: Condom, Injection, Surgical    Comment: Tubal Ligation   Other Topics Concern  . Not on file  Social History Narrative  . Not on file   Social Determinants of Health   Financial Resource Strain: Low Risk   . Difficulty of Paying Living Expenses:  Not hard at all  Food Insecurity: No Food Insecurity  . Worried About Charity fundraiser in the Last Year: Never true  . Ran Out of Food in the Last Year: Never true  Transportation Needs: No Transportation Needs  . Lack of Transportation (Medical): No  . Lack of Transportation (Non-Medical): No  Physical Activity: Inactive  . Days of Exercise per Week: 0 days  . Minutes of Exercise per Session: 0 min  Stress: No Stress Concern Present  . Feeling of Stress : Not at all  Social Connections: Moderately Isolated  . Frequency of Communication with Friends and Family: More than three times a week  . Frequency of Social Gatherings with Friends and Family: Not on file  . Attends Religious Services: Never  . Active Member of Clubs or Organizations: No  . Attends Archivist Meetings: Never  . Marital Status: Divorced  Human resources officer Violence: Not At Risk  . Fear of Current or Ex-Partner: No  . Emotionally Abused: No  . Physically Abused: No  . Sexually Abused: No     Current Outpatient Medications:  .  fluticasone (FLONASE) 50 MCG/ACT nasal spray, Place 2 sprays into both nostrils daily. (Patient taking differently: Place 2 sprays into both nostrils daily as needed for allergies. ), Disp: 16 g, Rfl: 6 .  HYDROcodone-acetaminophen (NORCO/VICODIN) 5-325 MG tablet, Take 1 tablet by mouth every 4 (four) hours as needed. Takes 1/2 pill at night time for pain, Disp: , Rfl:  .  ibuprofen (ADVIL,MOTRIN) 600 MG tablet, Take 1 tablet (600 mg total) by mouth every 6 (six) hours as needed for cramping., Disp: 60 tablet, Rfl: 0 .   loratadine (CLARITIN) 10 MG tablet, Take 1 tablet (10 mg total) by mouth daily as needed for allergies., Disp: 30 tablet, Rfl: 11 .  medroxyPROGESTERone (DEPO-PROVERA) 150 MG/ML injection, USE AS DIRECTED AT PHYSICIAN'S OFFICE EVERY 3 MONTHS, Disp: 1 mL, Rfl: 3 .  montelukast (SINGULAIR) 10 MG tablet, Take 1 tablet (10 mg total) by mouth at bedtime., Disp: 90 tablet, Rfl: 1 .  Multiple Vitamin (MULTIVITAMIN) tablet, Take 1 tablet by mouth daily., Disp: , Rfl:  .  Omega-3 Fatty Acids (FISH OIL) 1000 MG CAPS, Take 1,000 mg by mouth once a week. , Disp: , Rfl:  .  TAYTULLA 1-20 MG-MCG(24) CAPS, TAKE 1 CAPSULE BY MOUTH DAILY, Disp: 28 capsule, Rfl: 11 .  valsartan-hydrochlorothiazide (DIOVAN-HCT) 160-25 MG tablet, Take 1 tablet by mouth daily., Disp: 90 tablet, Rfl: 1 .  vitamin C (ASCORBIC ACID) 500 MG tablet, Take 500 mg by mouth daily. Reported on 02/01/2015, Disp: , Rfl:  .  Vitamin D, Ergocalciferol, (DRISDOL) 1.25 MG (50000 UNIT) CAPS capsule, Take 1 capsule (50,000 Units total) by mouth every 7 (seven) days., Disp: 12 capsule, Rfl: 1 .  Cyanocobalamin (B-12) 1000 MCG SUBL, Place 1 tablet under the tongue daily., Disp: 100 tablet, Rfl: 1  Current Facility-Administered Medications:  .  medroxyPROGESTERone (DEPO-PROVERA) injection 150 mg, 150 mg, Intramuscular, Q90 days, Steele Sizer, MD, 150 mg at 02/18/19 1010  Allergies  Allergen Reactions  . Cherry Hives and Swelling    Fresh cherries with a stem     ROS  Constitutional: Negative for fever or weight change.  Respiratory: Negative for cough and shortness of breath.   Cardiovascular: Negative for chest pain or palpitations.  Gastrointestinal: Negative for abdominal pain, no bowel changes.  Musculoskeletal: Negative for gait problem or joint swelling.  Skin: Negative for rash.  Neurological: Negative for  dizziness or headache.  No other specific complaints in a complete review of systems (except as listed in HPI  above).  Objective  Vitals:   07/15/19 0804 07/15/19 0823  BP: 124/86   Pulse: (!) 101 93  Resp: 16   Temp: 97.8 F (36.6 C)   TempSrc: Temporal   SpO2: 99% 100%  Weight: 207 lb 1.6 oz (93.9 kg)   Height: 5' (1.524 m)     Body mass index is 40.45 kg/m.  Physical Exam  Constitutional: Patient appears well-developed and well-nourished. No distress.  HENT: Head: Normocephalic and atraumatic. Ears: B TMs ok, no erythema or effusion; Nose: Not done Mouth/Throat: not done  Eyes: Conjunctivae and EOM are normal. Pupils are equal, round, and reactive to light. No scleral icterus.  Neck: Normal range of motion. Neck supple. No JVD present. No thyromegaly present.  Cardiovascular: Normal rate, regular rhythm and normal heart sounds.  No murmur heard. No BLE edema. Pulmonary/Chest: Effort normal and breath sounds normal. No respiratory distress. Abdominal: Soft. Bowel sounds are normal, no distension. There is no tenderness. no masses Breast: lumpy area on right upper outer breast, reviewed last mammogram done in 2018 - advised to recheck with screen mammogram at age 38,  no nipple discharge or rashes FEMALE GENITALIA:  External genitalia normal External urethra normal Vaginal vault normal without discharge or lesions Cervix normal without discharge or lesions Bimanual exam normal without masses RECTAL: not done Musculoskeletal: pain during rom of right ankle, very mild swelling on right  Neurological: he is alert and oriented to person, place, and time. No cranial nerve deficit. Coordination, balance, strength, speech and gait are normal.  Skin: Skin is warm and dry. No rash noted. No erythema.  Psychiatric: Patient has a normal mood and affect. behavior is normal. Judgment and thought content normal.  Fall Risk: Fall Risk  07/15/2019 07/13/2018 05/21/2018 04/16/2018 07/10/2017  Falls in the past year? 1 1 0 1 No  Number falls in past yr: 0 1 0 1 -  Injury with Fall? 1 1 0 1 -      Functional Status Survey: Is the patient deaf or have difficulty hearing?: No Does the patient have difficulty seeing, even when wearing glasses/contacts?: No Does the patient have difficulty concentrating, remembering, or making decisions?: No Does the patient have difficulty walking or climbing stairs?: Yes(stairs are hard with her ankle injury) Does the patient have difficulty dressing or bathing?: No Does the patient have difficulty doing errands alone such as visiting a doctor's office or shopping?: No   Assessment & Plan  1. Well adult exam  - Lipid panel - CBC with Differential/Platelet - Comprehensive metabolic panel - Hemoglobin A1c - Vitamin B12 - VITAMIN D 25 Hydroxy (Vit-D Deficiency, Fractures) - RPR - HIV Antibody (routine testing w rflx)  2. Breast cancer screening by mammogram  - MM 3D SCREEN BREAST BILATERAL; Future  3. B12 deficiency  - Cyanocobalamin (B-12) 1000 MCG SUBL; Place 1 tablet under the tongue daily.  Dispense: 100 tablet; Refill: 1 - Vitamin B12  4. Vitamin D deficiency  - Vitamin D, Ergocalciferol, (DRISDOL) 1.25 MG (50000 UNIT) CAPS capsule; Take 1 capsule (50,000 Units total) by mouth every 7 (seven) days.  Dispense: 12 capsule; Refill: 1 - VITAMIN D 25 Hydroxy (Vit-D Deficiency, Fractures)  5. Hypertension, benign  - valsartan-hydrochlorothiazide (DIOVAN-HCT) 160-25 MG tablet; Take 1 tablet by mouth daily.  Dispense: 90 tablet; Refill: 1 - CBC with Differential/Platelet - Comprehensive metabolic panel  6. Cervical  cancer screening  - Cytology - PAP  7. Routine screening for STI (sexually transmitted infection)  - RPR - HIV Antibody (routine testing w rflx) - Cytology - PAP  8. Morbid obesity with BMI of 40.0-44.9, adult [facility])  Discussed with the patient the risk posed by an increased BMI. Discussed importance of portion control, calorie counting and at least 150 minutes of physical activity weekly. Avoid sweet beverages  and drink more water. Eat at least 6 servings of fruit and vegetables daily   -USPSTF grade A and B recommendations reviewed with patient; age-appropriate recommendations, preventive care, screening tests, etc discussed and encouraged; healthy living encouraged; see AVS for patient education given to patient -Discussed importance of 150 minutes of physical activity weekly, eat two servings of fish weekly, eat one serving of tree nuts ( cashews, pistachios, pecans, almonds.Marland Kitchen) every other day, eat 6 servings of fruit/vegetables daily and drink plenty of water and avoid sweet beverages.

## 2019-07-18 LAB — CYTOLOGY - PAP
Chlamydia: NEGATIVE
Comment: NEGATIVE
Comment: NEGATIVE
Comment: NORMAL
Diagnosis: NEGATIVE
High risk HPV: NEGATIVE
Neisseria Gonorrhea: NEGATIVE

## 2019-07-20 ENCOUNTER — Other Ambulatory Visit: Payer: Self-pay | Admitting: Family Medicine

## 2019-07-20 ENCOUNTER — Telehealth: Payer: Self-pay

## 2019-07-20 ENCOUNTER — Other Ambulatory Visit: Payer: Managed Care, Other (non HMO)

## 2019-07-20 ENCOUNTER — Other Ambulatory Visit: Payer: Self-pay

## 2019-07-20 DIAGNOSIS — N6311 Unspecified lump in the right breast, upper outer quadrant: Secondary | ICD-10-CM

## 2019-07-20 DIAGNOSIS — I10 Essential (primary) hypertension: Secondary | ICD-10-CM

## 2019-07-20 DIAGNOSIS — Z113 Encounter for screening for infections with a predominantly sexual mode of transmission: Secondary | ICD-10-CM

## 2019-07-20 DIAGNOSIS — Z008 Encounter for other general examination: Secondary | ICD-10-CM

## 2019-07-20 DIAGNOSIS — Z0000 Encounter for general adult medical examination without abnormal findings: Secondary | ICD-10-CM

## 2019-07-20 NOTE — Telephone Encounter (Signed)
Copied from [id number] [id number]. Topic: Appointment Scheduling - Scheduling Inquiry for Clinic >> Jul 20, 2019 11:13 AM Gearldine Shown wrote: Reason for CRM: Pt called in and said she needed a mammogram and biopsy done. The lump in her breast has grown in size.  Please call patient back at [phone]

## 2019-07-21 NOTE — Telephone Encounter (Signed)
Patient has been notified that order has been placed. She will call and schedule.

## 2019-07-22 LAB — CBC WITH DIFFERENTIAL/PLATELET
Basophils Absolute: 0.1 10*3/uL (ref 0.0–0.2)
Basos: 1 %
EOS (ABSOLUTE): 0.2 10*3/uL (ref 0.0–0.4)
Eos: 2 %
Hematocrit: 37.7 % (ref 34.0–46.6)
Hemoglobin: 12.8 g/dL (ref 11.1–15.9)
Immature Grans (Abs): 0 10*3/uL (ref 0.0–0.1)
Immature Granulocytes: 0 %
Lymphocytes Absolute: 3.4 10*3/uL — ABNORMAL HIGH (ref 0.7–3.1)
Lymphs: 46 %
MCH: 30.5 pg (ref 26.6–33.0)
MCHC: 34 g/dL (ref 31.5–35.7)
MCV: 90 fL (ref 79–97)
Monocytes Absolute: 0.6 10*3/uL (ref 0.1–0.9)
Monocytes: 8 %
Neutrophils Absolute: 3.2 10*3/uL (ref 1.4–7.0)
Neutrophils: 43 %
Platelets: 383 10*3/uL (ref 150–450)
RBC: 4.2 x10E6/uL (ref 3.77–5.28)
RDW: 12.5 % (ref 11.7–15.4)
WBC: 7.4 10*3/uL (ref 3.4–10.8)

## 2019-07-22 LAB — LIPID PANEL
Chol/HDL Ratio: 3.9 ratio (ref 0.0–4.4)
Cholesterol, Total: 169 mg/dL (ref 100–199)
HDL: 43 mg/dL (ref >39)
LDL Chol Calc (NIH): 99 mg/dL (ref 0–99)
Triglycerides: 156 mg/dL — ABNORMAL HIGH (ref 0–149)
VLDL Cholesterol Cal: 27 mg/dL (ref 5–40)

## 2019-07-22 LAB — VITAMIN D 25 HYDROXY (VIT D DEFICIENCY, FRACTURES): Vit D, 25-Hydroxy: 25.8 ng/mL — ABNORMAL LOW (ref 30.0–100.0)

## 2019-07-22 LAB — COMPREHENSIVE METABOLIC PANEL
ALT: 15 IU/L (ref 0–32)
AST: 13 IU/L (ref 0–40)
Albumin/Globulin Ratio: 1.4 (ref 1.2–2.2)
Albumin: 4.1 g/dL (ref 3.8–4.8)
Alkaline Phosphatase: 50 IU/L (ref 48–121)
BUN/Creatinine Ratio: 19 (ref 9–23)
BUN: 16 mg/dL (ref 6–20)
Bilirubin Total: <0.2 mg/dL (ref 0.0–1.2)
CO2: 24 mmol/L (ref 20–29)
Calcium: 9.7 mg/dL (ref 8.7–10.2)
Chloride: 102 mmol/L (ref 96–106)
Creatinine, Ser: 0.86 mg/dL (ref 0.57–1.00)
GFR calc Af Amer: 98 mL/min/{1.73_m2} (ref >59)
GFR calc non Af Amer: 85 mL/min/{1.73_m2} (ref >59)
Globulin, Total: 2.9 g/dL (ref 1.5–4.5)
Glucose: 98 mg/dL (ref 65–99)
Potassium: 4.2 mmol/L (ref 3.5–5.2)
Sodium: 138 mmol/L (ref 134–144)
Total Protein: 7 g/dL (ref 6.0–8.5)

## 2019-07-22 LAB — RPR QUALITATIVE: RPR Ser Ql: NONREACTIVE

## 2019-07-22 LAB — HIV ANTIBODY (ROUTINE TESTING W REFLEX): HIV Screen 4th Generation wRfx: NONREACTIVE

## 2019-07-22 LAB — HEMOGLOBIN A1C
Est. average glucose Bld gHb Est-mCnc: 111 mg/dL
Hgb A1c MFr Bld: 5.5 % (ref 4.8–5.6)

## 2019-07-22 LAB — VITAMIN B12: Vitamin B-12: 262 pg/mL (ref 232–1245)

## 2019-07-23 ENCOUNTER — Other Ambulatory Visit: Payer: Self-pay | Admitting: Family Medicine

## 2019-07-23 NOTE — Telephone Encounter (Signed)
Requested medication (s) are due for refill today: yes  Requested medication (s) are on the active medication list: yes  Last refill:  yes  Future visit scheduled: yes  Notes to clinic:  prescription expired   Requested Prescriptions  Pending Prescriptions Disp Refills   medroxyPROGESTERone (DEPO-PROVERA) 150 MG/ML injection [Pharmacy Med Name: MEDROXYPROGESTERONE 150 MG/ML] 1 mL 3    Sig: USE AS DIRECTED AT PHYSICIAN'S OFFICE EVERY 3 MONTHS      Off-Protocol Failed - 07/23/2019 11:17 AM      Failed - Medication not assigned to a protocol, review manually.      Passed - Valid encounter within last 12 months    Recent Outpatient Visits           1 week ago Morbid obesity with BMI of 40.0-44.9, adult [facility])   [facility] Steele Sizer, MD   1 year ago Well woman exam with routine gynecological exam   [facility] Steele Sizer, MD   1 year ago Hypertension, benign   [facility] Steele Sizer, MD   1 year ago Morbid obesity [facility])   [facility] Steele Sizer, MD   2 years ago Well woman exam with routine gynecological exam   [facility] Steele Sizer, MD

## 2019-07-25 ENCOUNTER — Other Ambulatory Visit: Payer: Self-pay

## 2019-07-26 ENCOUNTER — Other Ambulatory Visit: Payer: Self-pay

## 2019-08-09 ENCOUNTER — Other Ambulatory Visit: Payer: Self-pay

## 2019-08-09 ENCOUNTER — Other Ambulatory Visit: Payer: Self-pay | Admitting: Obstetrics and Gynecology

## 2019-08-09 DIAGNOSIS — N921 Excessive and frequent menstruation with irregular cycle: Secondary | ICD-10-CM

## 2019-08-09 MED ORDER — NORETHIN ACE-ETH ESTRAD-FE 1-20 MG-MCG(24) PO CAPS: 1.0000 | ORAL_CAPSULE | Freq: Every day | ORAL | 0 refills | Status: DC

## 2019-08-09 NOTE — Telephone Encounter (Signed)
PT called and is stating that she saw Dr Chauncey Cruel on 6/4 and needs her meds refilled before tomorrow's appt also says she needs vial for DEPO lab /nurse visit tomorrow

## 2019-08-09 NOTE — Telephone Encounter (Signed)
Pt calling for refill of taytula; she has had physical c her primary; gets her bc from [place]; is on two diff bc d/t heavy bleeding.  [phone] pt aware refill eRx'd to get her to appt 08/31/19.

## 2019-08-09 NOTE — Telephone Encounter (Signed)
One refill to last until the appointment. Can you please advise patient.

## 2019-08-10 ENCOUNTER — Other Ambulatory Visit: Payer: Self-pay

## 2019-08-10 ENCOUNTER — Ambulatory Visit (INDEPENDENT_AMBULATORY_CARE_PROVIDER_SITE_OTHER): Payer: Managed Care, Other (non HMO)

## 2019-08-10 DIAGNOSIS — Z3042 Encounter for surveillance of injectable contraceptive: Secondary | ICD-10-CM

## 2019-08-10 NOTE — Telephone Encounter (Signed)
Yes she is taking both Depo and Oral Birth Control. She does not take this to prevent pregnancy. She is evaluated by GYN, she has an appointment coming up soon.

## 2019-08-31 ENCOUNTER — Ambulatory Visit (INDEPENDENT_AMBULATORY_CARE_PROVIDER_SITE_OTHER): Payer: Managed Care, Other (non HMO) | Admitting: Obstetrics and Gynecology

## 2019-08-31 ENCOUNTER — Other Ambulatory Visit: Payer: Self-pay

## 2019-08-31 ENCOUNTER — Encounter: Payer: Self-pay | Admitting: Obstetrics and Gynecology

## 2019-08-31 VITALS — BP 120/70 | HR 93 | Resp 18 | Ht 60.0 in | Wt 208.0 lb

## 2019-08-31 DIAGNOSIS — N938 Other specified abnormal uterine and vaginal bleeding: Secondary | ICD-10-CM | POA: Diagnosis not present

## 2019-08-31 DIAGNOSIS — N921 Excessive and frequent menstruation with irregular cycle: Secondary | ICD-10-CM | POA: Diagnosis not present
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
## 2019-08-31 NOTE — Patient Instructions (Signed)
Laparoscopically Assisted Vaginal Hysterectomy A laparoscopically assisted vaginal hysterectomy (LAVH) is a surgical procedure to remove the uterus and cervix. Sometimes, the ovaries and fallopian tubes are also removed. This surgery may be done to treat problems such as:  Noncancerous growths in the uterus (uterine fibroids) that cause symptoms.  A condition that causes the lining of the uterus to grow in other areas (endometriosis).  Problems with pelvic support.  Cancer of the cervix, ovaries, uterus, or tissue that lines the uterus (endometrium).  Excessive (dysfunctional) uterine bleeding. During an LAVH, some of the surgical removal is done through the vagina, and the rest is done through a few small incisions in the abdomen. This technique may be an option for women who are not able to have a vaginal hysterectomy. Tell a health care provider about:  Any allergies you have.  All medicines you are taking, including vitamins, herbs, eye drops, creams, and over-the-counter medicines.  Any problems you or family members have had with anesthetic medicines.  Any blood disorders you have.  Any surgeries you have had.  Any medical conditions you have.  Whether you are pregnant or may be pregnant. What are the risks? Generally, this is a safe procedure. However, problems may occur, including:  Infection.  Bleeding.  Allergic reactions to medicines.  Damage to other structures or organs.  Difficulty breathing. What happens before the procedure? Staying hydrated Follow instructions from your health care provider about hydration, which may include:  Up to 2 hours before the procedure - you may continue to drink clear liquids, such as water, clear fruit juice, black coffee, and plain tea. Eating and drinking restrictions Follow instructions from your health care provider about eating and drinking, which may include:  8 hours before the procedure - stop eating heavy meals or  foods such as meat, fried foods, or fatty foods.  6 hours before the procedure - stop eating light meals or foods, such as toast or cereal.  6 hours before the procedure - stop drinking milk or drinks that contain milk.  2 hours before the procedure - stop drinking clear liquids. Medicines  Ask your health care provider about: ? Changing or stopping your regular medicines. This is especially important if you are taking diabetes medicines or blood thinners. ? Taking over-the-counter medicines, vitamins, herbs, and supplements. ? Taking medicines such as aspirin and ibuprofen. These medicines can thin your blood. Do not take these medicines unless your health care provider tells you to take them.  You may be asked to take a medicine to empty your colon (bowel preparation).  You may be given antibiotic medicine to help prevent infection. General instructions  Plan to have someone take you home from the hospital or clinic.  Ask your health care provider how your surgical site will be marked or identified.  You may be asked to shower with a germ-killing soap.  Do not use any products that contain nicotine or tobacco, such as cigarettes and e-cigarettes. These can delay healing after surgery. If you need help quitting, ask your health care provider. What happens during the procedure?  To lower your risk of infection: ? Your health care team will wash or sanitize their hands. ? Hair may be removed from the surgical area. ? Your skin will be washed with soap.  An IV will be inserted into one of your veins.  You will be given one or more of the following: ? A medicine to help you relax (sedative). ? A medicine to  make you fall asleep (general anesthetic).  You may have a flexible tube (catheter) put into your bladder to drain urine.  You may have a tube put through your nose or mouth down into your stomach (nasogastric tube). The nasogastric tube will remove digestive fluids and  prevent nausea and vomiting.  Tight-fitting (compression) stockings will be placed on your legs to promote circulation.  Three or four small incisions will be made in your abdomen. An incision will also be made in your vagina.  Probes and tools will be inserted into the small incisions. The uterus and cervix (and possibly the ovaries and fallopian tubes) will be removed through your vagina as well as through the small incisions that were made in the abdomen.  The incisions will then be closed with stitches (sutures). The procedure may vary among health care providers and hospitals. What happens after the procedure?  Your blood pressure, heart rate, breathing rate, and blood oxygen level will be monitored until the medicines you were given have worn off.  You may have a liquid diet at first. You will most likely return to your usual diet the day after surgery.  You will still have the urinary catheter in place. It will likely be removed the day after surgery.  You may have to wear compression stockings. These stockings help to prevent blood clots and reduce swelling in your legs.  You will be encouraged to walk as soon as possible. You will also use a device or do breathing exercises to keep your lungs clear.  Do not drive for 24 hours if you were given a sedative. Summary  A laparoscopically assisted vaginal hysterectomy (LAVH) is a surgical procedure to remove the uterus and cervix, and sometimes the ovaries and fallopian tubes.  Follow instructions from your health care provider about eating and drinking before the procedure.  During an LAVH, some of the surgical removal is done through the vagina, and the rest is done through a few small incisions in the abdomen. This information is not intended to replace advice given to you by your health care provider. Make sure you discuss any questions you have with your health care provider. Document Revised: 03/22/2018 Document Reviewed:  04/24/2016 Elsevier Patient Education  2020 [place]. Endometrial Ablation Endometrial ablation is a procedure that destroys the thin inner layer of the lining of the uterus (endometrium). This procedure may be done:  To stop heavy periods.  To stop bleeding that is causing anemia.  To control irregular bleeding.  To treat bleeding caused by small tumors (fibroids) in the endometrium. This procedure is often an alternative to major surgery, such as removal of the uterus and cervix (hysterectomy). As a result of this procedure:  You may not be able to have children. However, if you are premenopausal (you have not gone through menopause): ? You may still have a small chance of getting pregnant. ? You will need to use a reliable method of birth control after the procedure to prevent pregnancy.  You may stop having a menstrual period, or you may have only a small amount of bleeding during your period. Menstruation may return several years after the procedure. Tell a health care provider about:  Any allergies you have.  All medicines you are taking, including vitamins, herbs, eye drops, creams, and over-the-counter medicines.  Any problems you or family members have had with the use of anesthetic medicines.  Any blood disorders you have.  Any surgeries you have had.  Any medical  conditions you have. What are the risks? Generally, this is a safe procedure. However, problems may occur, including:  A hole (perforation) in the uterus or bowel.  Infection of the uterus, bladder, or vagina.  Bleeding.  Damage to other structures or organs.  An air bubble in the lung (air embolus).  Problems with pregnancy after the procedure.  Failure of the procedure.  Decreased ability to diagnose cancer in the endometrium. What happens before the procedure?  You will have tests of your endometrium to make sure there are no pre-cancerous cells or cancer cells present.  You may have  an ultrasound of the uterus.  You may be given medicines to thin the endometrium.  Ask your health care provider about: ? Changing or stopping your regular medicines. This is especially important if you take diabetes medicines or blood thinners. ? Taking medicines such as aspirin and ibuprofen. These medicines can thin your blood. Do not take these medicines before your procedure if your doctor tells you not to.  Plan to have someone take you home from the hospital or clinic. What happens during the procedure?   You will lie on an exam table with your feet and legs supported as in a pelvic exam.  To lower your risk of infection: ? Your health care team will wash or sanitize their hands and put on germ-free (sterile) gloves. ? Your genital area will be washed with soap.  An IV tube will be inserted into one of your veins.  You will be given a medicine to help you relax (sedative).  A surgical instrument with a light and camera (resectoscope) will be inserted into your vagina and moved into your uterus. This allows your surgeon to see inside your uterus.  Endometrial tissue will be removed using one of the following methods: ? Radiofrequency. This method uses a radiofrequency-alternating electric current to remove the endometrium. ? Cryotherapy. This method uses extreme cold to freeze the endometrium. ? Heated-free liquid. This method uses a heated saltwater (saline) solution to remove the endometrium. ? Microwave. This method uses high-energy microwaves to heat up the endometrium and remove it. ? Thermal balloon. This method involves inserting a catheter with a balloon tip into the uterus. The balloon tip is filled with heated fluid to remove the endometrium. The procedure may vary among health care providers and hospitals. What happens after the procedure?  Your blood pressure, heart rate, breathing rate, and blood oxygen level will be monitored until the medicines you were given  have worn off.  As tissue healing occurs, you may notice vaginal bleeding for 4-6 weeks after the procedure. You may also experience: ? Cramps. ? Thin, watery vaginal discharge that is light pink or brown in color. ? A need to urinate more frequently than usual. ? Nausea.  Do not drive for 24 hours if you were given a sedative.  Do not have sex or insert anything into your vagina until your health care provider approves. Summary  Endometrial ablation is done to treat the many causes of heavy menstrual bleeding.  The procedure may be done only after medications have been tried to control the bleeding.  Plan to have someone take you home from the hospital or clinic. This information is not intended to replace advice given to you by your health care provider. Make sure you discuss any questions you have with your health care provider. Document Revised: 07/14/2017 Document Reviewed: 02/14/2016 Elsevier Patient Education  [place].

## 2019-10-12 ENCOUNTER — Telehealth: Payer: Self-pay

## 2019-10-12 NOTE — Telephone Encounter (Signed)
Pt calling; has appt on the 15th to discuss surgery; has done her research and wants to go ahead with the surgery - the one that leaves the ovaries.  Can surgery be done on the 15th?  Would like surgery done on a Fri wo will have to weekend to recover.  [id number] okay to LM.  Left detailed msg CRS out of the office today; is back in tomorrow; msg will be sent.

## 2019-10-16 ENCOUNTER — Other Ambulatory Visit: Payer: Self-pay | Admitting: Obstetrics and Gynecology

## 2019-10-16 DIAGNOSIS — N921 Excessive and frequent menstruation with irregular cycle: Secondary | ICD-10-CM

## 2019-10-26 ENCOUNTER — Encounter: Payer: Self-pay | Admitting: Obstetrics and Gynecology

## 2019-10-26 ENCOUNTER — Other Ambulatory Visit: Payer: Self-pay

## 2019-10-26 ENCOUNTER — Ambulatory Visit (INDEPENDENT_AMBULATORY_CARE_PROVIDER_SITE_OTHER): Payer: Managed Care, Other (non HMO) | Admitting: Obstetrics and Gynecology

## 2019-10-26 VITALS — BP 110/72 | HR 86 | Resp 18 | Ht 60.0 in | Wt 208.6 lb

## 2019-10-26 DIAGNOSIS — N938 Other specified abnormal uterine and vaginal bleeding: Secondary | ICD-10-CM

## 2019-10-26 DIAGNOSIS — N921 Excessive and frequent menstruation with irregular cycle: Secondary | ICD-10-CM | POA: Diagnosis not present
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
## 2019-10-31 ENCOUNTER — Telehealth: Payer: Self-pay | Admitting: Obstetrics and Gynecology

## 2019-10-31 DIAGNOSIS — M7670 Peroneal tendinitis, unspecified leg: Secondary | ICD-10-CM | POA: Insufficient documentation

## 2019-10-31 DIAGNOSIS — M659 Synovitis and tenosynovitis, unspecified: Secondary | ICD-10-CM | POA: Insufficient documentation

## 2019-10-31 NOTE — Telephone Encounter (Signed)
Called patient to schedule TLH/BS, cystoscopy w [facility]  DOS 10/5  H&P - n/a    Covid testing 10/1 @ 8-10:30, [facility] [employer], drive up and wear mask. Advised pt to quarantine until DOS.  Pre-admit phone call appointment to be requested. All appointments will be updated on pt MyChart. Explained that this appointment has a call window. Based on the time scheduled will indicate if the call will be received within a 4 hour window before 1:00 or after.  Advised that pt may also receive calls from the hospital pharmacy and pre-service center.  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 No secondary insurance.

## 2019-10-31 NOTE — Telephone Encounter (Signed)
-----   Message from Homero Fellers, MD sent at 10/26/2019  8:41 AM EDT ----- Surgery Booking Request Patient Full Name:  Anita Newton  MRN: [id number]  DOB: [birth year]  Surgeon: Homero Fellers, MD  Requested Surgery Date and Time: patient desires ASAP Primary Diagnosis AND Code: Menorrhagia Secondary Diagnosis and Code:  Surgical Procedure: TLH/BS- cystoscopy RNFA Requested?: No L&D Notification: No Admission Status: same day surgery Length of Surgery: 150 min Special Case Needs: No H&P: No Phone Interview???:  Yes Interpreter: No Medical Clearance:  No Special Scheduling Instructions: No Any known health/anesthesia issues, diabetes, sleep apnea, latex allergy, defibrillator/pacemaker?: No Acuity: P3   (P1 highest, P2 delay may cause harm, P3 low, elective gyn, P4 lowest)

## 2019-11-02 ENCOUNTER — Ambulatory Visit (INDEPENDENT_AMBULATORY_CARE_PROVIDER_SITE_OTHER): Payer: Managed Care, Other (non HMO)

## 2019-11-02 ENCOUNTER — Other Ambulatory Visit: Payer: Self-pay

## 2019-11-02 DIAGNOSIS — Z3042 Encounter for surveillance of injectable contraceptive: Secondary | ICD-10-CM

## 2019-11-02 MED ORDER — MEDROXYPROGESTERONE ACETATE 150 MG/ML IM SUSP
150.0000 mg | Freq: Once | INTRAMUSCULAR | Status: AC
  Administered 2019-11-02: 150 mg via INTRAMUSCULAR

## 2019-11-07 ENCOUNTER — Other Ambulatory Visit: Payer: Self-pay | Admitting: Family Medicine

## 2019-11-07 ENCOUNTER — Ambulatory Visit
Admission: RE | Admit: 2019-11-07 | Discharge: 2019-11-07 | Disposition: A | Discharge status: Home / Self Care | Payer: Managed Care, Other (non HMO) | Source: Ambulatory Visit | Attending: Family Medicine | Admitting: Family Medicine

## 2019-11-07 ENCOUNTER — Other Ambulatory Visit: Payer: Self-pay

## 2019-11-07 DIAGNOSIS — N6320 Unspecified lump in the left breast, unspecified quadrant: Secondary | ICD-10-CM

## 2019-11-07 DIAGNOSIS — N6311 Unspecified lump in the right breast, upper outer quadrant: Secondary | ICD-10-CM | POA: Diagnosis present

## 2019-11-07 IMAGING — US US BREAST*L* LIMITED INC AXILLA
1 series · 11 of 11 positions shown · non-contrast
Comparison: Previous exam(s).

CLINICAL DATA: Patient's physician palpated an abnormality 12
o'clock region of the right breast 8 cm from the nipple.

EXAM:
DIGITAL DIAGNOSTIC BILATERAL MAMMOGRAM WITH CAD AND TOMO
ULTRASOUND BILATERAL BREAST

[Series 1: us breast*left* limited inc axilla · 0.08mm/px · 11 of 11 slices shown]
[im 1/11]
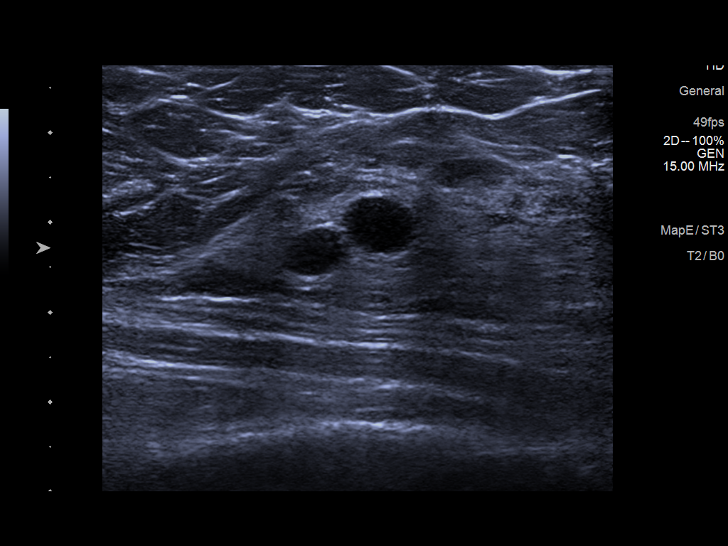
[im 2/11]
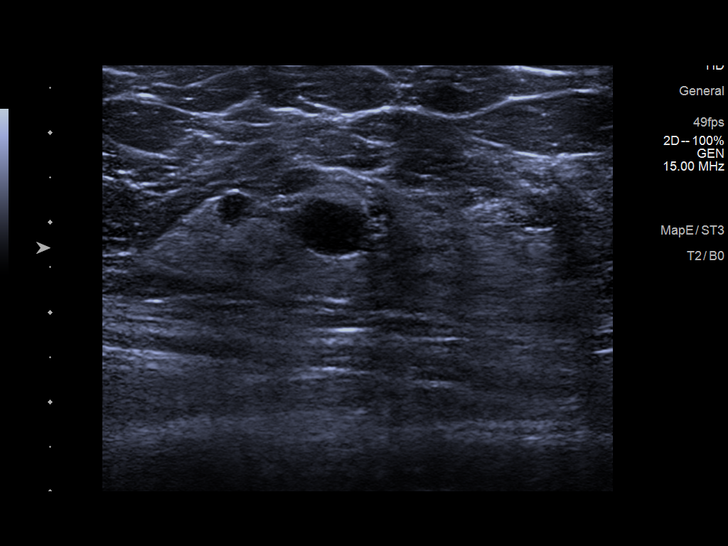
[im 3/11]
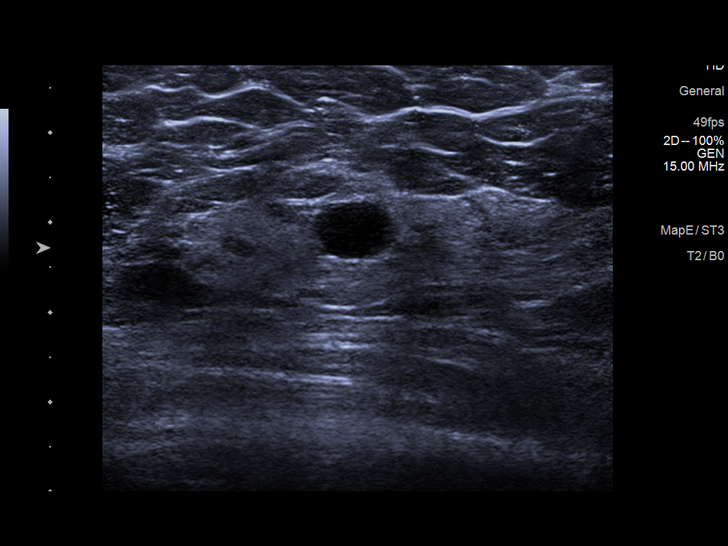
[im 4/11]
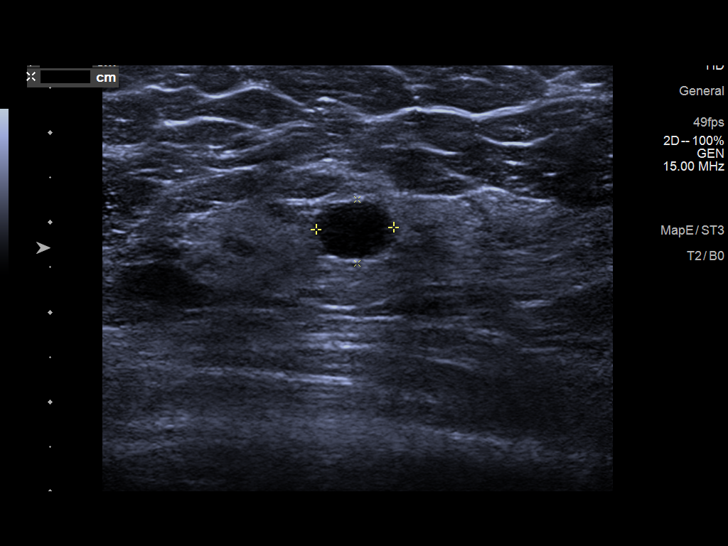
[im 5/11]
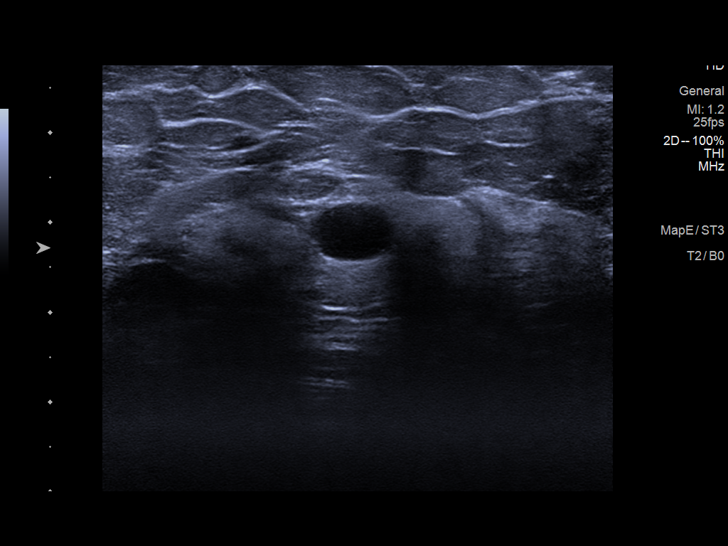
[im 6/11]
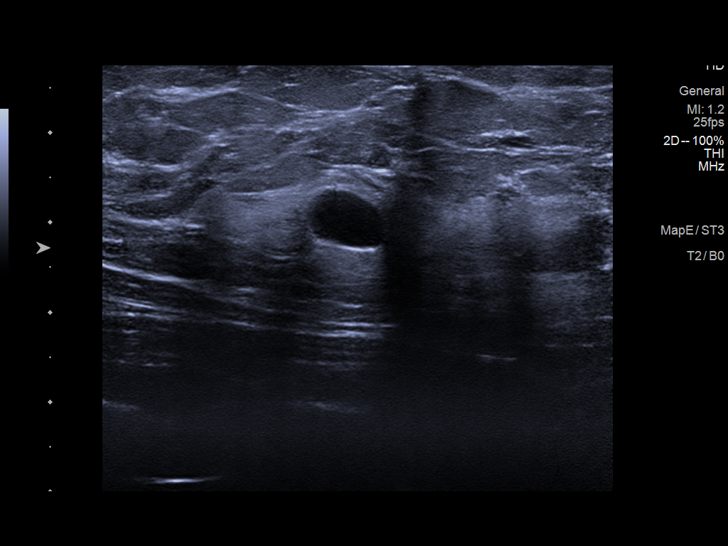
[im 7/11]
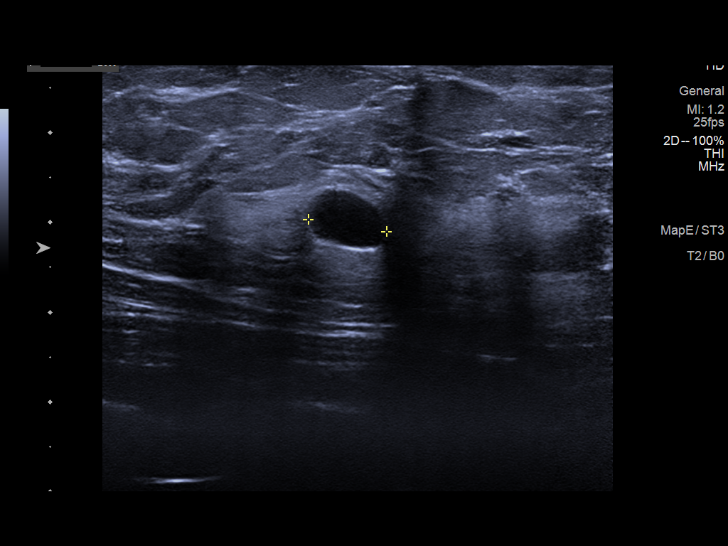
[im 8/11]
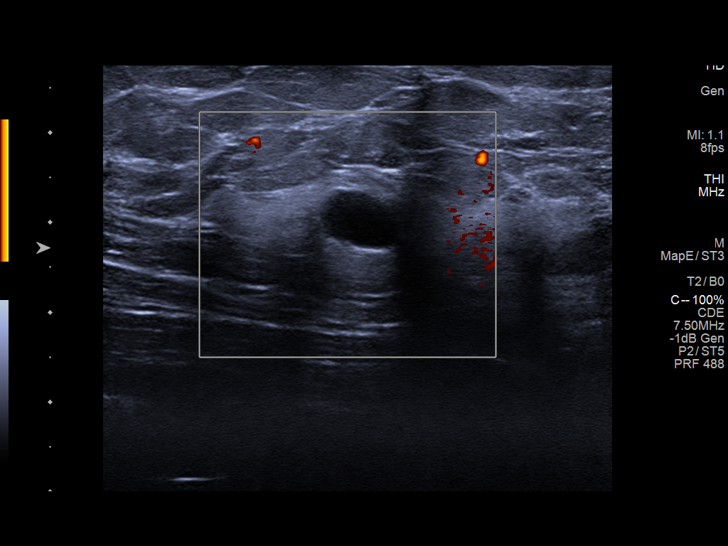
[im 9/11]
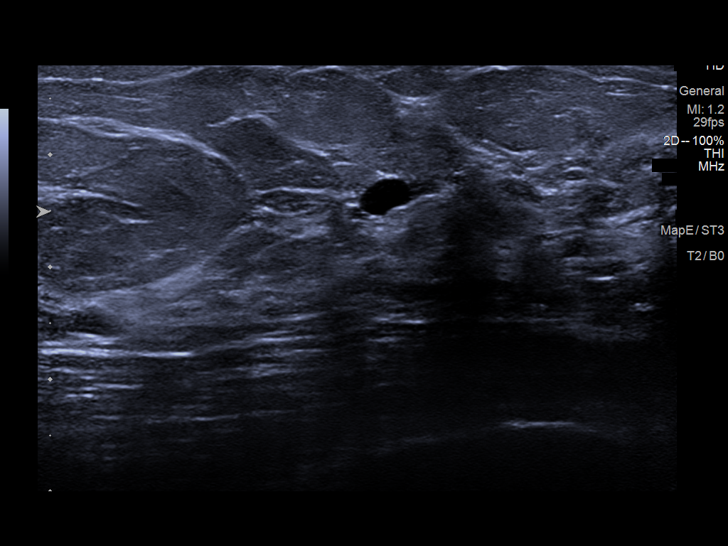
[im 10/11]
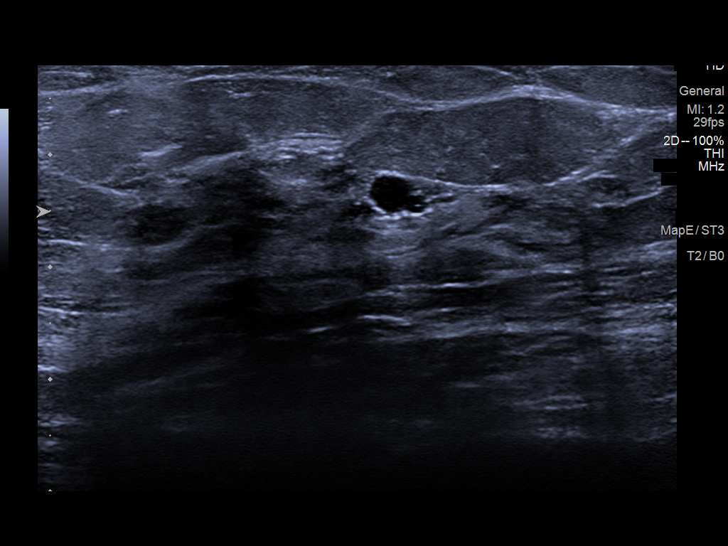
[im 11/11]
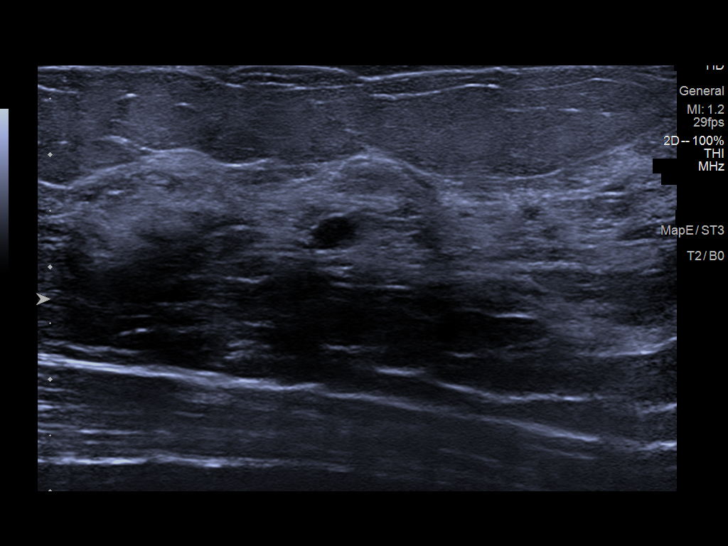

[11 of 11 positions shown; findings below may reference images not displayed]

ACR Breast Density Category c: The breast tissue is heterogeneously
dense, which may obscure small masses.
FINDINGS: No suspicious mass, malignant type microcalcifications or distortion
detected in the right breast.

In the upper outer quadrant of the left breast there is a
well-circumscribed 9 mm mass. There are no malignant type
microcalcifications.

Mammographic images were processed with CAD.

On physical exam, I do not palpate a mass in the 12 o'clock region
of the right 8 cm from the nipple. I do not palpate a mass in the
upper-outer quadrant of the left breast

Targeted ultrasound is performed, showing normal tissue in the area
of clinical concern in the right breast at 12 o'clock 8 cm from the
nipple. A well-circumscribed anechoic cyst is seen in the right
breast [DATE] 6 cm from the nipple measuring 5 x 4 x 7 mm.
Sonographic evaluation of the left breast shows an anechoic cyst at
2 o'clock 9 cm from the nipple measuring 9 x 7 x 9 mm. There are
adjacent smaller cysts. There is no solid mass.
IMPRESSION: Bilateral breast cysts.  No evidence of malignancy in either breast.

RECOMMENDATION:
Bilateral screening mammogram in 1 year is recommended.

I have discussed the findings and recommendations with the patient.
If applicable, a reminder letter will be sent to the patient
regarding the next appointment.

BI-RADS CATEGORY  2: Benign.

## 2019-11-07 IMAGING — US US BREAST*R* LIMITED INC AXILLA
1 series · 8 of 8 positions shown · non-contrast
Comparison: Previous exam(s).

CLINICAL DATA: Patient's physician palpated an abnormality 12
o'clock region of the right breast 8 cm from the nipple.

EXAM:
DIGITAL DIAGNOSTIC BILATERAL MAMMOGRAM WITH CAD AND TOMO
ULTRASOUND BILATERAL BREAST

[Series 1: us breast*right* limited inc axilla · 0.07mm/px · 8 of 8 slices shown]
[im 1/8]
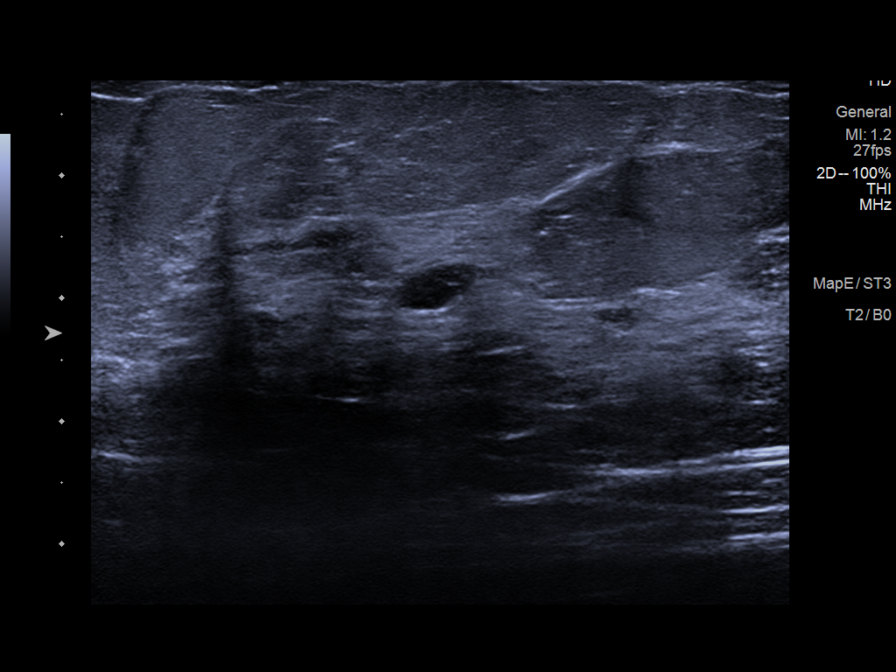
[im 2/8]
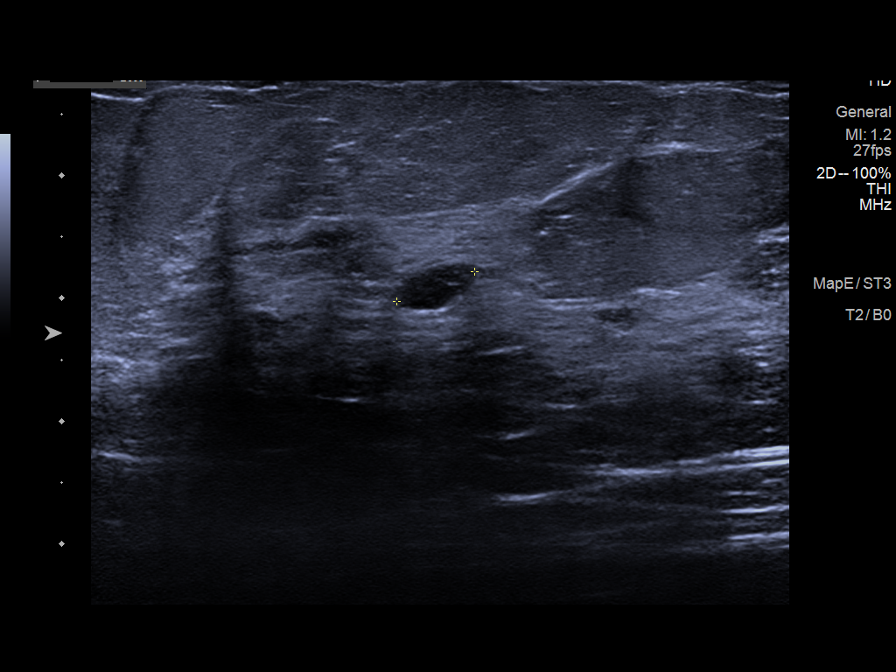
[im 3/8]
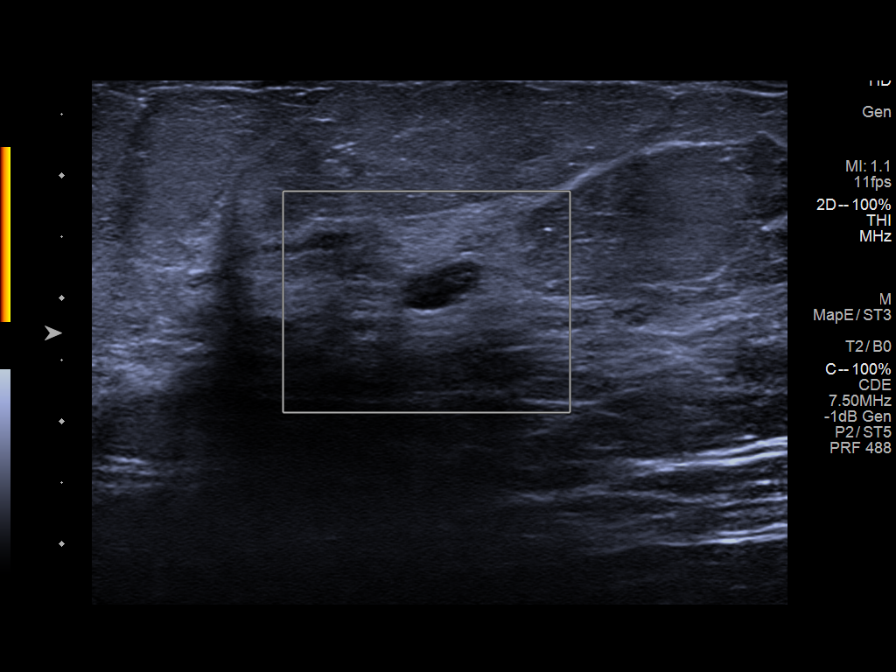
[im 4/8]
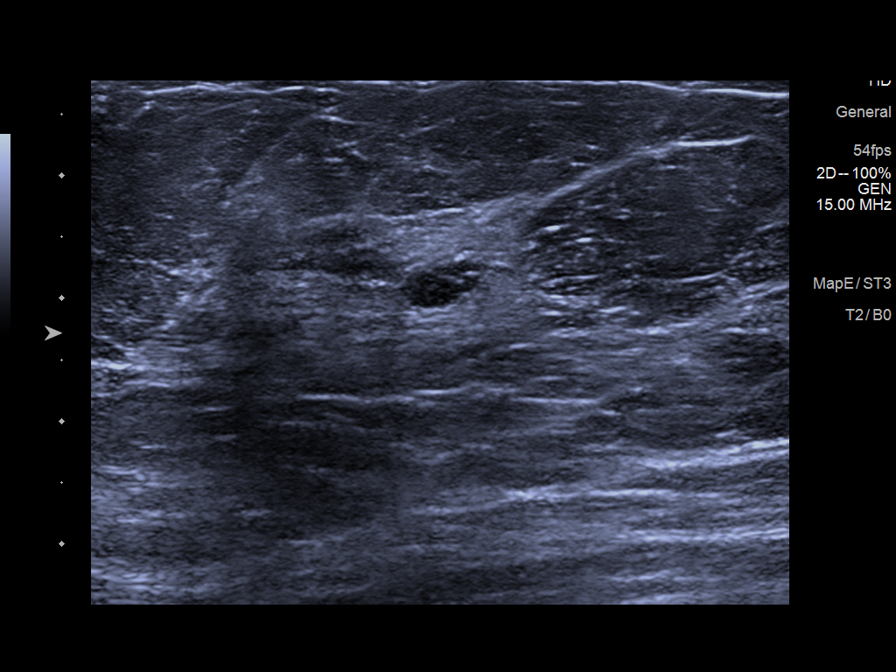
[im 5/8]
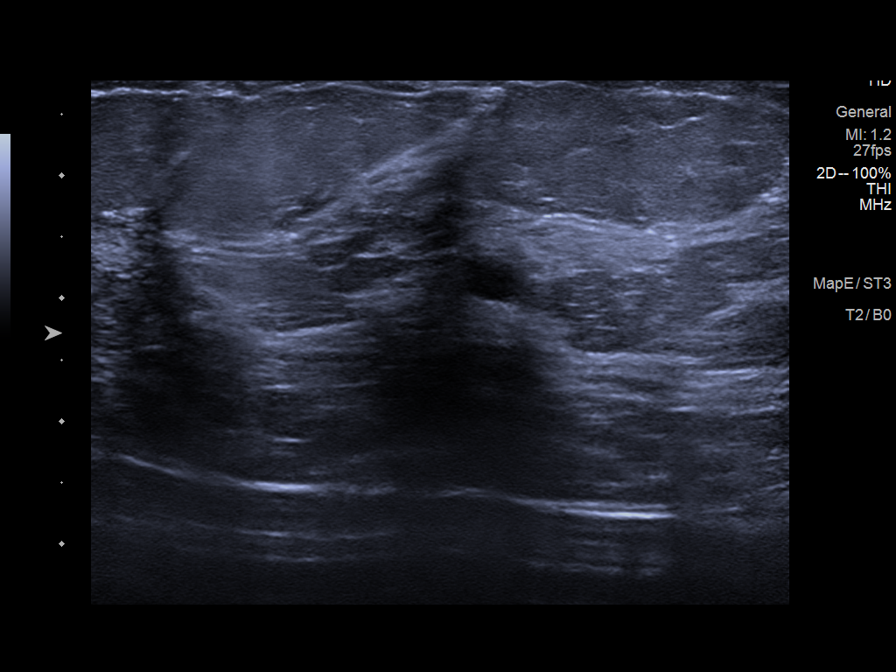
[im 6/8]
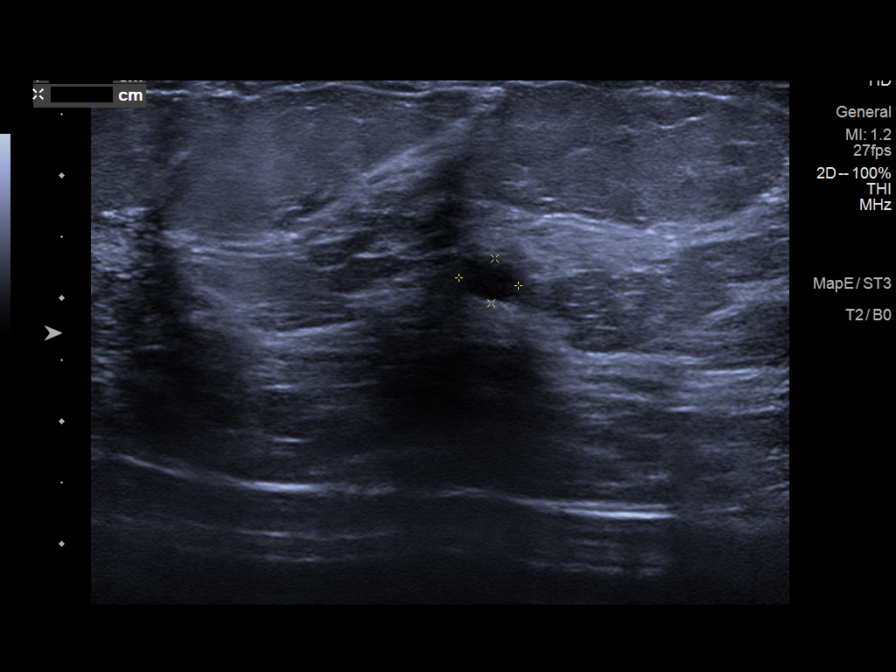
[im 7/8]
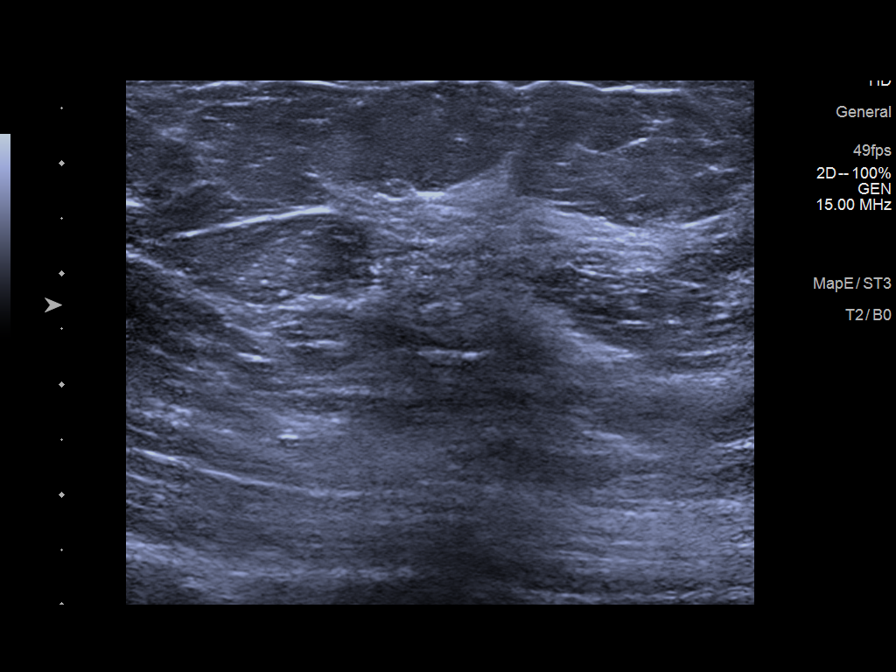
[im 8/8]
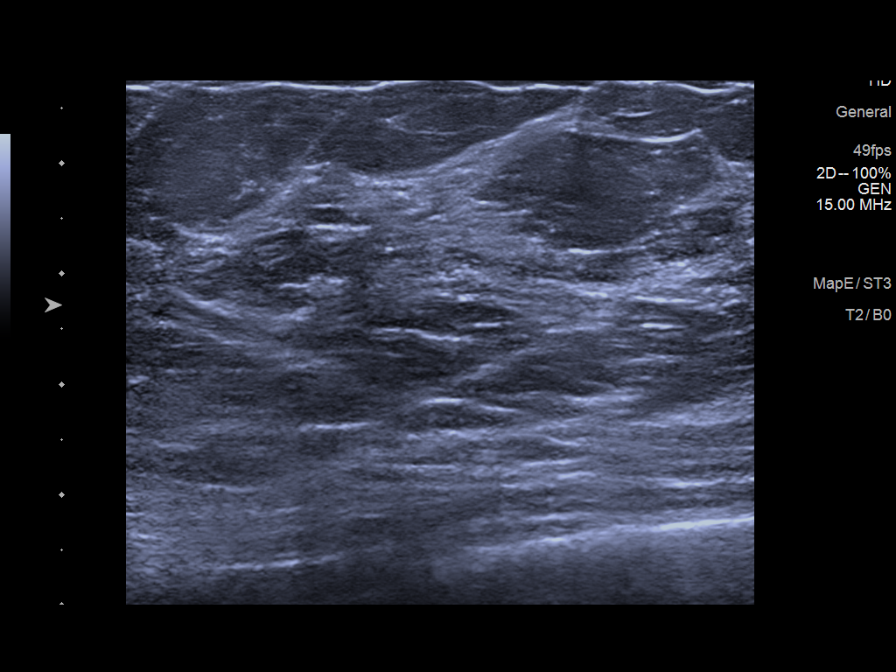

[8 of 8 positions shown; findings below may reference images not displayed]

ACR Breast Density Category c: The breast tissue is heterogeneously
dense, which may obscure small masses.
FINDINGS: No suspicious mass, malignant type microcalcifications or distortion
detected in the right breast.

In the upper outer quadrant of the left breast there is a
well-circumscribed 9 mm mass. There are no malignant type
microcalcifications.

Mammographic images were processed with CAD.

On physical exam, I do not palpate a mass in the 12 o'clock region
of the right 8 cm from the nipple. I do not palpate a mass in the
upper-outer quadrant of the left breast

Targeted ultrasound is performed, showing normal tissue in the area
of clinical concern in the right breast at 12 o'clock 8 cm from the
nipple. A well-circumscribed anechoic cyst is seen in the right
breast [DATE] 6 cm from the nipple measuring 5 x 4 x 7 mm.
Sonographic evaluation of the left breast shows an anechoic cyst at
2 o'clock 9 cm from the nipple measuring 9 x 7 x 9 mm. There are
adjacent smaller cysts. There is no solid mass.
IMPRESSION: Bilateral breast cysts.  No evidence of malignancy in either breast.

RECOMMENDATION:
Bilateral screening mammogram in 1 year is recommended.

I have discussed the findings and recommendations with the patient.
If applicable, a reminder letter will be sent to the patient
regarding the next appointment.

BI-RADS CATEGORY  2: Benign.

## 2019-11-07 IMAGING — MG DIGITAL DIAGNOSTIC BILAT W/ TOMO W/ CAD
6 of 12 series · 6 of 36 positions shown · non-contrast
Comparison: Previous exam(s).

CLINICAL DATA: Patient's physician palpated an abnormality 12
o'clock region of the right breast 8 cm from the nipple.

EXAM:
DIGITAL DIAGNOSTIC BILATERAL MAMMOGRAM WITH CAD AND TOMO
ULTRASOUND BILATERAL BREAST

[L CC synth-2D (1 of 2)]
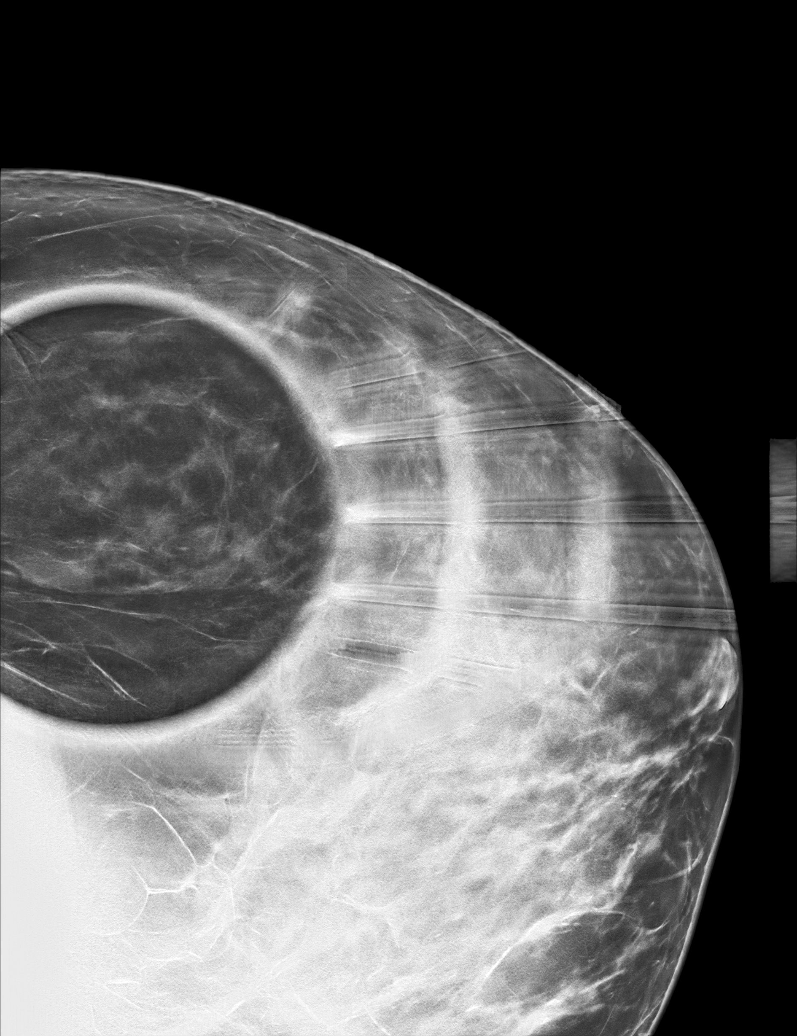

[R TAN synth-2D]
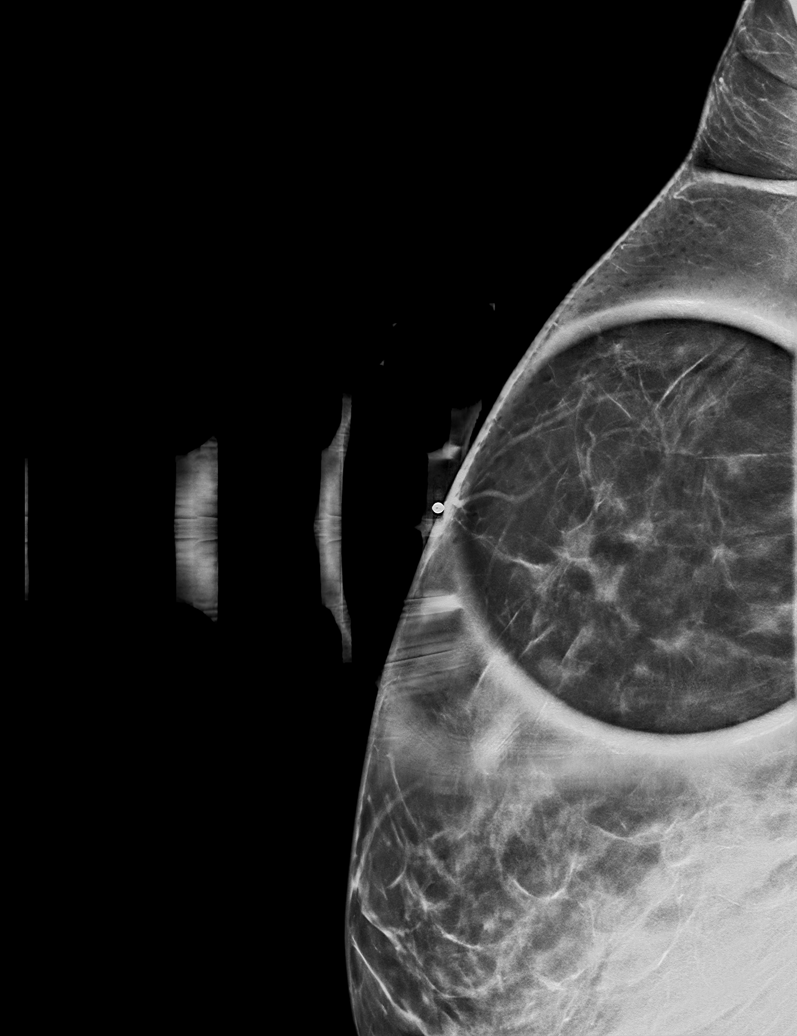

[L MLO synth-2D]
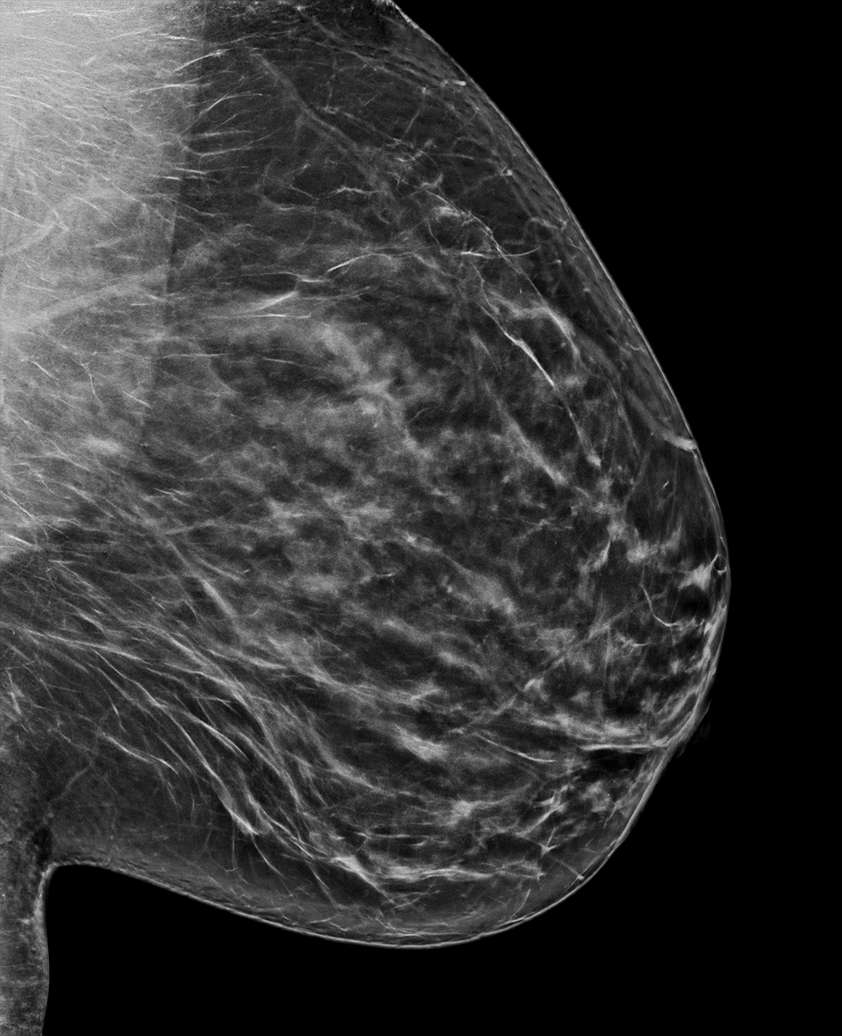

[L CC synth-2D (2 of 2)]
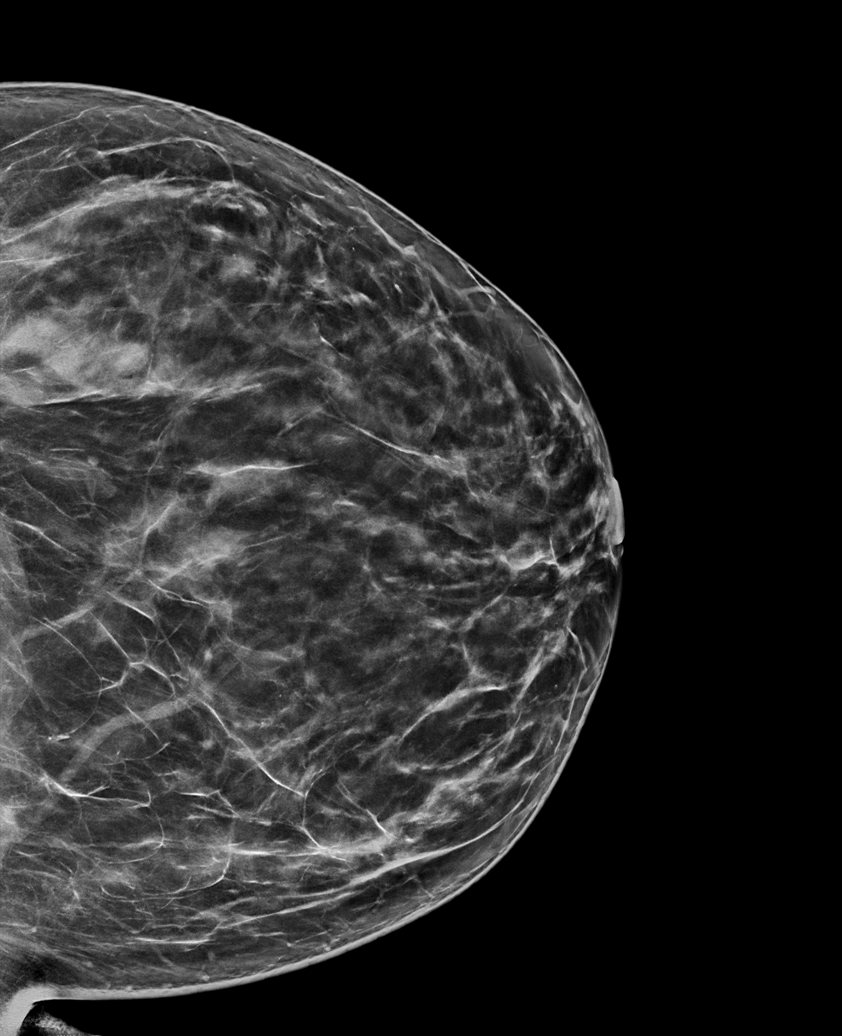

[R MLO synth-2D]
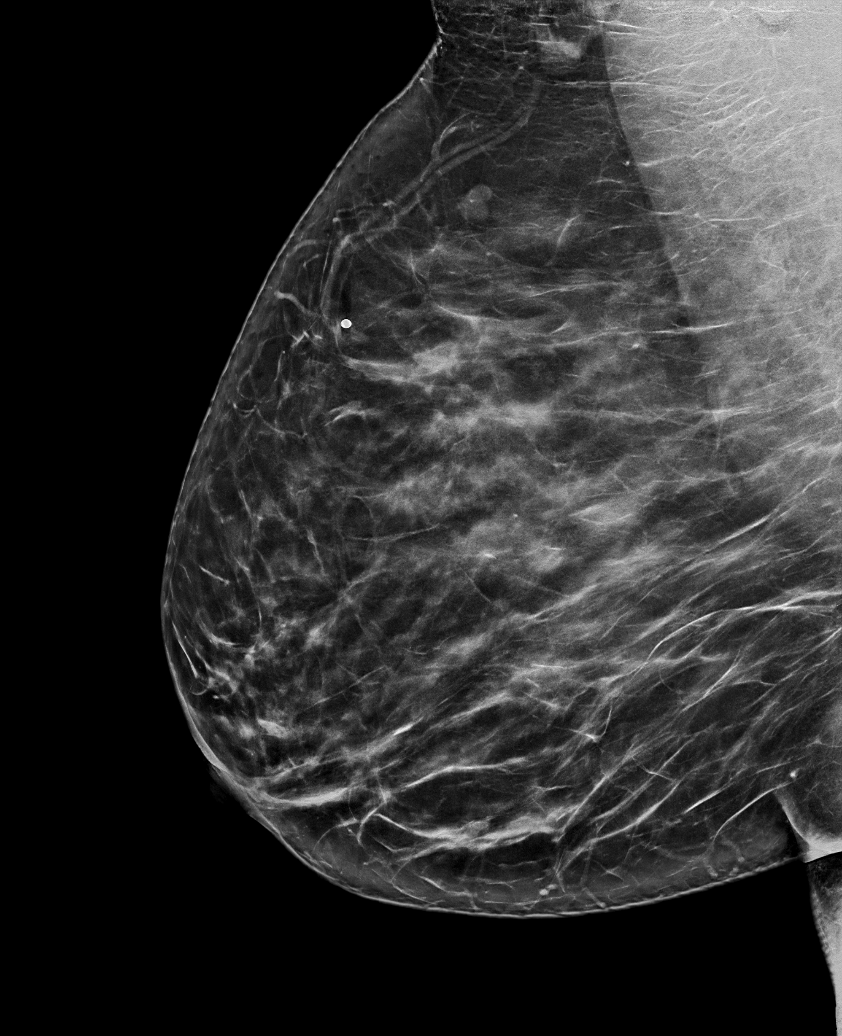

[R CC synth-2D]
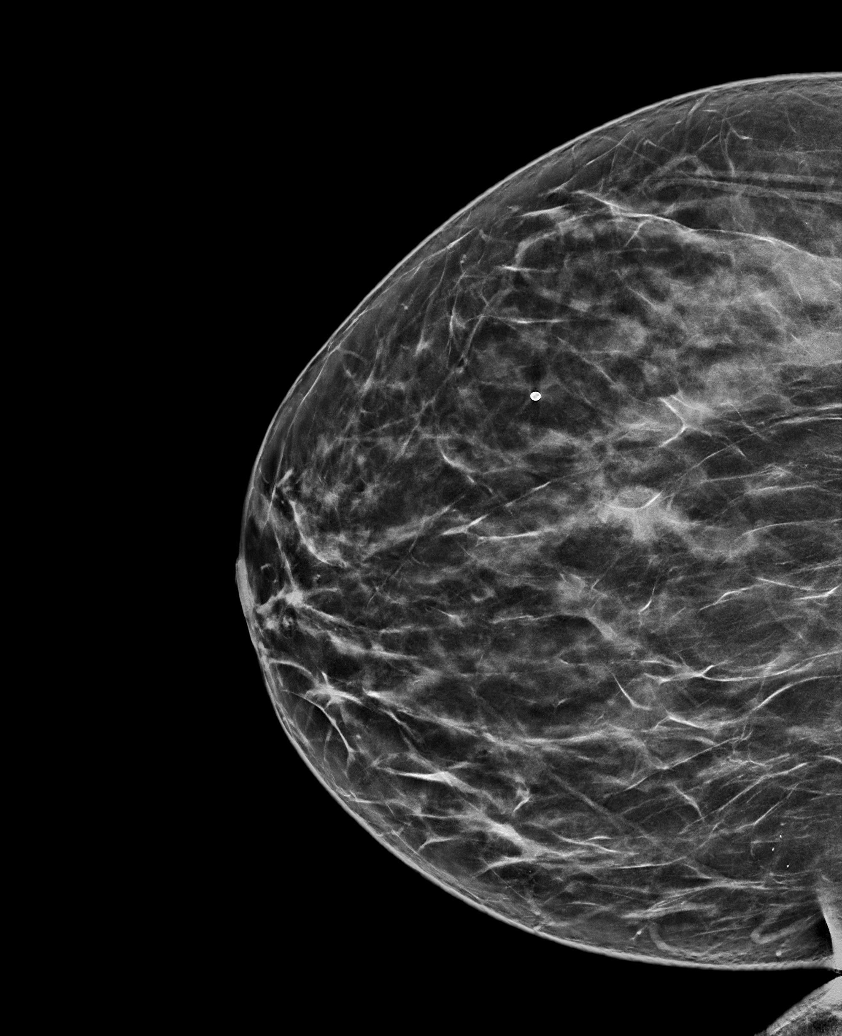

[6 of 36 positions shown; findings below may reference images not displayed]

ACR Breast Density Category c: The breast tissue is heterogeneously
dense, which may obscure small masses.
FINDINGS: No suspicious mass, malignant type microcalcifications or distortion
detected in the right breast.

In the upper outer quadrant of the left breast there is a
well-circumscribed 9 mm mass. There are no malignant type
microcalcifications.

Mammographic images were processed with CAD.

On physical exam, I do not palpate a mass in the 12 o'clock region
of the right 8 cm from the nipple. I do not palpate a mass in the
upper-outer quadrant of the left breast

Targeted ultrasound is performed, showing normal tissue in the area
of clinical concern in the right breast at 12 o'clock 8 cm from the
nipple. A well-circumscribed anechoic cyst is seen in the right
breast [DATE] 6 cm from the nipple measuring 5 x 4 x 7 mm.
Sonographic evaluation of the left breast shows an anechoic cyst at
2 o'clock 9 cm from the nipple measuring 9 x 7 x 9 mm. There are
adjacent smaller cysts. There is no solid mass.
IMPRESSION: Bilateral breast cysts.  No evidence of malignancy in either breast.

RECOMMENDATION:
Bilateral screening mammogram in 1 year is recommended.

I have discussed the findings and recommendations with the patient.
If applicable, a reminder letter will be sent to the patient
regarding the next appointment.

BI-RADS CATEGORY  2: Benign.

## 2019-11-08 ENCOUNTER — Other Ambulatory Visit: Payer: Managed Care, Other (non HMO)

## 2019-11-08 ENCOUNTER — Encounter
Admission: RE | Admit: 2019-11-08 | Discharge: 2019-11-08 | Disposition: A | Discharge status: Home / Self Care | Payer: Managed Care, Other (non HMO) | Source: Ambulatory Visit | Attending: Obstetrics and Gynecology | Admitting: Obstetrics and Gynecology

## 2019-11-08 DIAGNOSIS — Z01818 Encounter for other preprocedural examination: Secondary | ICD-10-CM | POA: Insufficient documentation

## 2019-11-08 DIAGNOSIS — I10 Essential (primary) hypertension: Secondary | ICD-10-CM | POA: Diagnosis not present

## 2019-11-08 HISTORY — DX: Anemia, unspecified: D64.9

## 2019-11-08 HISTORY — DX: Cardiac arrhythmia, unspecified: I49.9

## 2019-11-08 HISTORY — DX: Unspecified convulsions: R56.9

## 2019-11-08 HISTORY — DX: Other complications of anesthesia, initial encounter: T88.59XA

## 2019-11-08 NOTE — Patient Instructions (Signed)
Your procedure is scheduled on:11-15-19 TUESDAY Report to Day Surgery on the 2nd floor of the [facility]. To find out your arrival time, please call [phone] between 1PM - 3PM on: 11-14-19 MONDAY  REMEMBER: Instructions that are not followed completely may result in serious medical risk, up to and including death; or upon the discretion of your surgeon and anesthesiologist your surgery may need to be rescheduled.  Do not eat food after midnight the night before surgery.  No gum chewing, lozengers or hard candies.  You may however, drink CLEAR liquids up to 2 hours before you are scheduled to arrive for your surgery. Do not drink anything within 2 hours of your scheduled arrival time.  Clear liquids include: - water  - apple juice without pulp - gatorade (not RED) - black coffee or tea (Do NOT add milk or creamers to the coffee or tea) Do NOT drink anything that is not on this list.  In addition, your doctor has ordered for you to drink the provided  Ensure Pre-Surgery Clear Carbohydrate Drink  Drinking this carbohydrate drink up to two hours before surgery helps to reduce insulin resistance and improve patient outcomes. Please complete drinking 2 hours prior to scheduled arrival time.  TAKE THESE MEDICATIONS THE MORNING OF SURGERY WITH A SIP OF WATER: -NONE  One week prior to surgery: Stop Anti-inflammatories (NSAIDS) such as Advil, Aleve, Ibuprofen, Motrin, Naproxen, Naprosyn and Aspirin based products such as Excedrin, Goodys Powder, BC Powder-TYLENOL OK TO TAKE IF NEEDED  Stop ANY OVER THE COUNTER supplements until after surgery. (You may continue taking Tylenol, Vitamin D, Vitamin B, and multivitamin.)-STOP YOUR FISH OIL AND VITAMIN C NOW-YOU MAY RESUME AFTER SURGERY  No Alcohol for 24 hours before or after surgery.  No Smoking including e-cigarettes for 24 hours prior to surgery.  No chewable tobacco products for at least 6 hours prior to surgery.  No nicotine patches  on the day of surgery.  Do not use any "recreational" drugs for at least a week prior to your surgery.  Please be advised that the combination of cocaine and anesthesia may have negative outcomes, up to and including death. If you test positive for cocaine, your surgery will be cancelled.  On the morning of surgery brush your teeth with toothpaste and water, you may rinse your mouth with mouthwash if you wish. Do not swallow any toothpaste or mouthwash.  Do not wear jewelry, make-up, hairpins, clips or nail polish.  Do not wear lotions, powders, or perfumes.   Do not shave 48 hours prior to surgery.   Contact lenses, hearing aids and dentures may not be worn into surgery.  Do not bring valuables to the hospital. [facility] is not responsible for any missing/lost belongings or valuables.   Use CHG Soap or wipes as directed on instruction sheet.  Notify your doctor if there is any change in your medical condition (cold, fever, infection).  Wear comfortable clothing (specific to your surgery type) to the hospital.  Plan for stool softeners for home use; pain medications have a tendency to cause constipation. You can also help prevent constipation by eating foods high in fiber such as fruits and vegetables and drinking plenty of fluids as your diet allows.  After surgery, you can help prevent lung complications by doing breathing exercises.  Take deep breaths and cough every 1-2 hours. Your doctor may order a device called an Incentive Spirometer to help you take deep breaths. When coughing or sneezing, hold a  pillow firmly against your incision with both hands. This is called "splinting." Doing this helps protect your incision. It also decreases belly discomfort.  If you are being admitted to the hospital overnight, leave your suitcase in the car. After surgery it may be brought to your room.  If you are being discharged the day of surgery, you will not be allowed to drive home. You  will need a responsible adult (18 years or older) to drive you home and stay with you that night.   If you are taking public transportation, you will need to have a responsible adult (18 years or older) with you. Please confirm with your physician that it is acceptable to use public transportation.   Please call the [facility] at [phone] if you have any questions about these instructions.  Visitation Policy:  Patients undergoing a surgery or procedure may have one family member or support person with them as long as that person is not COVID-19 positive or experiencing its symptoms.  That person may remain in the waiting area during the procedure.  Inpatient Visitation Update:   In an effort to ensure the safety of our team members and our patients, we are implementing a change to our visitation policy:  Effective Monday, Aug. 9, at 7 a.m., inpatients will be allowed one support person.  o The support person may change daily.  o The support person must pass our screening, gel in and out, and wear a mask at all times, including in the patient's room.  o Patients must also wear a mask when staff or their support person are in the room.  o Masking is required regardless of vaccination status.  Systemwide, no visitors 17 or younger.

## 2019-11-09 ENCOUNTER — Encounter
Admission: RE | Admit: 2019-11-09 | Discharge: 2019-11-09 | Disposition: A | Discharge status: Home / Self Care | Payer: Managed Care, Other (non HMO) | Source: Ambulatory Visit | Attending: Obstetrics and Gynecology | Admitting: Obstetrics and Gynecology

## 2019-11-09 ENCOUNTER — Other Ambulatory Visit: Payer: Self-pay

## 2019-11-09 DIAGNOSIS — I10 Essential (primary) hypertension: Secondary | ICD-10-CM | POA: Insufficient documentation

## 2019-11-09 DIAGNOSIS — Z01818 Encounter for other preprocedural examination: Secondary | ICD-10-CM | POA: Insufficient documentation

## 2019-11-09 LAB — CBC
HCT: 37.1 % (ref 36.0–46.0)
Hemoglobin: 13.2 g/dL (ref 12.0–15.0)
MCH: 30.1 pg (ref 26.0–34.0)
MCHC: 35.6 g/dL (ref 30.0–36.0)
MCV: 84.7 fL (ref 80.0–100.0)
Platelets: 383 10*3/uL (ref 150–400)
RBC: 4.38 MIL/uL (ref 3.87–5.11)
RDW: 12 % (ref 11.5–15.5)
WBC: 6.7 10*3/uL (ref 4.0–10.5)
nRBC: 0 % (ref 0.0–0.2)

## 2019-11-09 LAB — POTASSIUM: Potassium: 3.3 mmol/L — ABNORMAL LOW (ref 3.5–5.1)

## 2019-11-09 LAB — TYPE AND SCREEN
ABO/RH(D): A POS
Antibody Screen: NEGATIVE

## 2019-11-11 ENCOUNTER — Other Ambulatory Visit: Payer: Self-pay

## 2019-11-11 ENCOUNTER — Other Ambulatory Visit
Admission: RE | Admit: 2019-11-11 | Discharge: 2019-11-11 | Disposition: A | Discharge status: Home / Self Care | Payer: Managed Care, Other (non HMO) | Source: Ambulatory Visit | Attending: Obstetrics and Gynecology | Admitting: Obstetrics and Gynecology

## 2019-11-11 DIAGNOSIS — Z20822 Contact with and (suspected) exposure to covid-19: Secondary | ICD-10-CM | POA: Insufficient documentation

## 2019-11-11 DIAGNOSIS — Z01812 Encounter for preprocedural laboratory examination: Secondary | ICD-10-CM | POA: Insufficient documentation

## 2019-11-12 LAB — SARS CORONAVIRUS 2 (TAT 6-24 HRS): SARS Coronavirus 2: NEGATIVE

## 2019-11-15 ENCOUNTER — Encounter: Payer: Self-pay | Admitting: Obstetrics and Gynecology

## 2019-11-15 ENCOUNTER — Inpatient Hospital Stay
Admission: AD | Admit: 2019-11-15 | Discharge: 2019-11-18 | DRG: 742 | Disposition: A | Discharge status: Home / Self Care | Payer: Managed Care, Other (non HMO) | Attending: Obstetrics and Gynecology | Admitting: Obstetrics and Gynecology

## 2019-11-15 ENCOUNTER — Ambulatory Visit: Payer: Managed Care, Other (non HMO)

## 2019-11-15 ENCOUNTER — Other Ambulatory Visit: Payer: Self-pay

## 2019-11-15 ENCOUNTER — Encounter
Admission: AD | Disposition: A | Discharge status: Home / Self Care | Payer: Self-pay | Source: Home / Self Care | Attending: Obstetrics and Gynecology

## 2019-11-15 DIAGNOSIS — Z9851 Tubal ligation status: Secondary | ICD-10-CM

## 2019-11-15 DIAGNOSIS — N72 Inflammatory disease of cervix uteri: Secondary | ICD-10-CM | POA: Diagnosis not present

## 2019-11-15 DIAGNOSIS — R531 Weakness: Secondary | ICD-10-CM | POA: Diagnosis not present

## 2019-11-15 DIAGNOSIS — N800 Endometriosis of uterus: Secondary | ICD-10-CM

## 2019-11-15 DIAGNOSIS — N888 Other specified noninflammatory disorders of cervix uteri: Secondary | ICD-10-CM | POA: Diagnosis not present

## 2019-11-15 DIAGNOSIS — D251 Intramural leiomyoma of uterus: Secondary | ICD-10-CM

## 2019-11-15 DIAGNOSIS — Z90710 Acquired absence of both cervix and uterus: Secondary | ICD-10-CM | POA: Diagnosis present

## 2019-11-15 DIAGNOSIS — I499 Cardiac arrhythmia, unspecified: Secondary | ICD-10-CM | POA: Diagnosis present

## 2019-11-15 DIAGNOSIS — N920 Excessive and frequent menstruation with regular cycle: Principal | ICD-10-CM

## 2019-11-15 DIAGNOSIS — I10 Essential (primary) hypertension: Secondary | ICD-10-CM | POA: Diagnosis present

## 2019-11-15 DIAGNOSIS — G259 Extrapyramidal and movement disorder, unspecified: Secondary | ICD-10-CM | POA: Diagnosis not present

## 2019-11-15 DIAGNOSIS — Z6841 Body Mass Index (BMI) 40.0 and over, adult: Secondary | ICD-10-CM

## 2019-11-15 DIAGNOSIS — D509 Iron deficiency anemia, unspecified: Secondary | ICD-10-CM | POA: Diagnosis present

## 2019-11-15 HISTORY — PX: TOTAL LAPAROSCOPIC HYSTERECTOMY WITH SALPINGECTOMY: SHX6742

## 2019-11-15 HISTORY — PX: CYSTOSCOPY: SHX5120

## 2019-11-15 LAB — POCT PREGNANCY, URINE: Preg Test, Ur: NEGATIVE

## 2019-11-15 SURGERY — HYSTERECTOMY, TOTAL, LAPAROSCOPIC, WITH SALPINGECTOMY
Anesthesia: General

## 2019-11-15 MED ORDER — HYDROCHLOROTHIAZIDE 25 MG PO TABS
25.0000 mg | ORAL_TABLET | Freq: Every day | ORAL | Status: DC
  Administered 2019-11-17 – 2019-11-18 (×2): 25 mg via ORAL
  Filled 2019-11-15 (×3): qty 1

## 2019-11-15 MED ORDER — LIDOCAINE HCL (PF) 2 % IJ SOLN
INTRAMUSCULAR | Status: AC
  Filled 2019-11-15: qty 5

## 2019-11-15 MED ORDER — POLYETHYLENE GLYCOL 3350 17 G PO PACK
17.0000 g | PACK | Freq: Every day | ORAL | Status: DC | PRN
  Filled 2019-11-15: qty 1

## 2019-11-15 MED ORDER — FENTANYL CITRATE (PF) 100 MCG/2ML IJ SOLN
INTRAMUSCULAR | Status: AC
  Filled 2019-11-15: qty 2

## 2019-11-15 MED ORDER — CEFAZOLIN SODIUM-DEXTROSE 2-4 GM/100ML-% IV SOLN
INTRAVENOUS | Status: AC
  Filled 2019-11-15: qty 100

## 2019-11-15 MED ORDER — KETAMINE HCL 50 MG/ML IJ SOLN
INTRAMUSCULAR | Status: AC
  Filled 2019-11-15: qty 10

## 2019-11-15 MED ORDER — OXYCODONE HCL 5 MG/5ML PO SOLN: 5.0000 mg | Freq: Once | ORAL | Status: AC | PRN

## 2019-11-15 MED ORDER — IRBESARTAN 150 MG PO TABS
150.0000 mg | ORAL_TABLET | Freq: Every day | ORAL | Status: DC
  Administered 2019-11-17 – 2019-11-18 (×2): 150 mg via ORAL
  Filled 2019-11-15 (×5): qty 1

## 2019-11-15 MED ORDER — ACETAMINOPHEN 10 MG/ML IV SOLN
INTRAVENOUS | Status: AC
  Filled 2019-11-15: qty 100

## 2019-11-15 MED ORDER — MIDAZOLAM HCL 2 MG/2ML IJ SOLN
INTRAMUSCULAR | Status: AC
  Filled 2019-11-15: qty 2

## 2019-11-15 MED ORDER — FAMOTIDINE 20 MG PO TABS: 20.0000 mg | ORAL_TABLET | Freq: Once | ORAL | Status: AC

## 2019-11-15 MED ORDER — LACTATED RINGERS IV SOLN: INTRAVENOUS | Status: DC

## 2019-11-15 MED ORDER — ACETAMINOPHEN 500 MG PO TABS
1000.0000 mg | ORAL_TABLET | Freq: Four times a day (QID) | ORAL | Status: DC
  Administered 2019-11-15 – 2019-11-16 (×6): 1000 mg via ORAL
  Filled 2019-11-15 (×6): qty 2

## 2019-11-15 MED ORDER — HYDROMORPHONE HCL 1 MG/ML IJ SOLN
0.2500 mg | INTRAMUSCULAR | Status: DC | PRN
  Administered 2019-11-15: 0.25 mg via INTRAVENOUS

## 2019-11-15 MED ORDER — ROCURONIUM BROMIDE 10 MG/ML (PF) SYRINGE
PREFILLED_SYRINGE | INTRAVENOUS | Status: AC
  Filled 2019-11-15: qty 10

## 2019-11-15 MED ORDER — FENTANYL CITRATE (PF) 100 MCG/2ML IJ SOLN
25.0000 ug | INTRAMUSCULAR | Status: DC | PRN
  Administered 2019-11-15: 50 ug via INTRAVENOUS

## 2019-11-15 MED ORDER — DEXAMETHASONE SODIUM PHOSPHATE 10 MG/ML IJ SOLN
INTRAMUSCULAR | Status: AC
  Filled 2019-11-15: qty 1

## 2019-11-15 MED ORDER — DEXMEDETOMIDINE HCL 200 MCG/2ML IV SOLN
INTRAVENOUS | Status: DC | PRN
  Administered 2019-11-15 (×5): 4 ug via INTRAVENOUS

## 2019-11-15 MED ORDER — KETOROLAC TROMETHAMINE 30 MG/ML IJ SOLN
INTRAMUSCULAR | Status: DC | PRN
  Administered 2019-11-15: 30 mg via INTRAVENOUS

## 2019-11-15 MED ORDER — ONDANSETRON HCL 4 MG/2ML IJ SOLN
INTRAMUSCULAR | Status: AC
  Filled 2019-11-15: qty 2

## 2019-11-15 MED ORDER — SODIUM CHLORIDE FLUSH 0.9 % IV SOLN
INTRAVENOUS | Status: AC
  Filled 2019-11-15: qty 10

## 2019-11-15 MED ORDER — PHENYLEPHRINE HCL (PRESSORS) 10 MG/ML IV SOLN
INTRAVENOUS | Status: AC
  Filled 2019-11-15: qty 1

## 2019-11-15 MED ORDER — SUCCINYLCHOLINE CHLORIDE 200 MG/10ML IV SOSY
PREFILLED_SYRINGE | INTRAVENOUS | Status: AC
  Filled 2019-11-15: qty 10

## 2019-11-15 MED ORDER — MIDAZOLAM HCL 2 MG/2ML IJ SOLN
INTRAMUSCULAR | Status: DC | PRN
  Administered 2019-11-15: 2 mg via INTRAVENOUS

## 2019-11-15 MED ORDER — SIMETHICONE 80 MG PO CHEW
80.0000 mg | CHEWABLE_TABLET | Freq: Four times a day (QID) | ORAL | Status: DC
  Administered 2019-11-15 – 2019-11-18 (×12): 80 mg via ORAL
  Filled 2019-11-15 (×13): qty 1

## 2019-11-15 MED ORDER — ORAL CARE MOUTH RINSE: 15.0000 mL | Freq: Once | OROMUCOSAL | Status: AC

## 2019-11-15 MED ORDER — FENTANYL CITRATE (PF) 100 MCG/2ML IJ SOLN
INTRAMUSCULAR | Status: DC | PRN
Start: 2019-11-15 — End: 2019-11-15
  Administered 2019-11-15 (×2): 50 ug via INTRAVENOUS

## 2019-11-15 MED ORDER — PROPOFOL 10 MG/ML IV BOLUS
INTRAVENOUS | Status: AC
  Filled 2019-11-15: qty 40

## 2019-11-15 MED ORDER — SUGAMMADEX SODIUM 200 MG/2ML IV SOLN
INTRAVENOUS | Status: DC | PRN
  Administered 2019-11-15: 200 mg via INTRAVENOUS

## 2019-11-15 MED ORDER — CHLORHEXIDINE GLUCONATE 0.12 % MT SOLN
OROMUCOSAL | Status: AC
  Administered 2019-11-15: 15 mL via OROMUCOSAL
  Filled 2019-11-15: qty 15

## 2019-11-15 MED ORDER — HYDROMORPHONE HCL 1 MG/ML IJ SOLN
INTRAMUSCULAR | Status: AC
  Administered 2019-11-15: 0.25 mg via INTRAVENOUS
  Filled 2019-11-15: qty 1

## 2019-11-15 MED ORDER — SODIUM CHLORIDE FLUSH 0.9 % IV SOLN
INTRAVENOUS | Status: AC
  Filled 2019-11-15: qty 50

## 2019-11-15 MED ORDER — ROCURONIUM BROMIDE 100 MG/10ML IV SOLN
INTRAVENOUS | Status: DC | PRN
  Administered 2019-11-15: 20 mg via INTRAVENOUS
  Administered 2019-11-15: 10 mg via INTRAVENOUS
  Administered 2019-11-15: 50 mg via INTRAVENOUS
  Administered 2019-11-15: 20 mg via INTRAVENOUS

## 2019-11-15 MED ORDER — DOCUSATE SODIUM 100 MG PO CAPS
100.0000 mg | ORAL_CAPSULE | Freq: Two times a day (BID) | ORAL | Status: DC
  Administered 2019-11-15 – 2019-11-18 (×6): 100 mg via ORAL
  Filled 2019-11-15 (×7): qty 1

## 2019-11-15 MED ORDER — OXYCODONE HCL 5 MG PO TABS
ORAL_TABLET | ORAL | Status: AC
  Administered 2019-11-15: 5 mg via ORAL
  Filled 2019-11-15: qty 1

## 2019-11-15 MED ORDER — LIDOCAINE HCL (CARDIAC) PF 100 MG/5ML IV SOSY
PREFILLED_SYRINGE | INTRAVENOUS | Status: DC | PRN
  Administered 2019-11-15: 100 mg via INTRAVENOUS

## 2019-11-15 MED ORDER — BUPIVACAINE LIPOSOME 1.3 % IJ SUSP
INTRAMUSCULAR | Status: AC
  Filled 2019-11-15: qty 20

## 2019-11-15 MED ORDER — MORPHINE SULFATE (PF) 2 MG/ML IV SOLN
2.0000 mg | INTRAVENOUS | Status: DC | PRN
  Administered 2019-11-15: 2 mg via INTRAVENOUS
  Filled 2019-11-15: qty 1

## 2019-11-15 MED ORDER — ACETAMINOPHEN 10 MG/ML IV SOLN
INTRAVENOUS | Status: DC | PRN
  Administered 2019-11-15: 1000 mg via INTRAVENOUS

## 2019-11-15 MED ORDER — BUPIVACAINE HCL (PF) 0.5 % IJ SOLN
INTRAMUSCULAR | Status: AC
  Filled 2019-11-15: qty 30

## 2019-11-15 MED ORDER — KETAMINE HCL 50 MG/ML IJ SOLN
INTRAMUSCULAR | Status: DC | PRN
  Administered 2019-11-15: 30 mg via INTRAMUSCULAR

## 2019-11-15 MED ORDER — FENTANYL CITRATE (PF) 100 MCG/2ML IJ SOLN
INTRAMUSCULAR | Status: AC
  Administered 2019-11-15: 50 ug via INTRAVENOUS
  Filled 2019-11-15: qty 2

## 2019-11-15 MED ORDER — IBUPROFEN 600 MG PO TABS
600.0000 mg | ORAL_TABLET | Freq: Four times a day (QID) | ORAL | Status: DC
  Administered 2019-11-16 – 2019-11-18 (×8): 600 mg via ORAL
  Filled 2019-11-15 (×8): qty 1

## 2019-11-15 MED ORDER — KETOROLAC TROMETHAMINE 30 MG/ML IJ SOLN
30.0000 mg | Freq: Four times a day (QID) | INTRAMUSCULAR | Status: AC
  Administered 2019-11-15 – 2019-11-16 (×4): 30 mg via INTRAVENOUS
  Filled 2019-11-15 (×4): qty 1

## 2019-11-15 MED ORDER — PROPOFOL 10 MG/ML IV BOLUS
INTRAVENOUS | Status: DC | PRN
  Administered 2019-11-15: 150 mg via INTRAVENOUS

## 2019-11-15 MED ORDER — BISACODYL 10 MG RE SUPP: 10.0000 mg | Freq: Every day | RECTAL | Status: DC | PRN

## 2019-11-15 MED ORDER — KETOROLAC TROMETHAMINE 30 MG/ML IJ SOLN
INTRAMUSCULAR | Status: AC
  Filled 2019-11-15: qty 1

## 2019-11-15 MED ORDER — PROMETHAZINE HCL 25 MG/ML IJ SOLN
INTRAMUSCULAR | Status: AC
  Administered 2019-11-15: 12.5 mg via INTRAVENOUS
  Filled 2019-11-15: qty 1

## 2019-11-15 MED ORDER — DEXAMETHASONE SODIUM PHOSPHATE 10 MG/ML IJ SOLN
INTRAMUSCULAR | Status: DC | PRN
  Administered 2019-11-15: 10 mg via INTRAVENOUS

## 2019-11-15 MED ORDER — PHENYLEPHRINE HCL (PRESSORS) 10 MG/ML IV SOLN
INTRAVENOUS | Status: DC | PRN
  Administered 2019-11-15 (×4): 100 ug via INTRAVENOUS

## 2019-11-15 MED ORDER — PROMETHAZINE HCL 25 MG/ML IJ SOLN: 6.2500 mg | INTRAMUSCULAR | Status: DC | PRN

## 2019-11-15 MED ORDER — FAMOTIDINE 20 MG PO TABS
ORAL_TABLET | ORAL | Status: AC
  Administered 2019-11-15: 20 mg via ORAL
  Filled 2019-11-15: qty 1

## 2019-11-15 MED ORDER — VALSARTAN-HYDROCHLOROTHIAZIDE 160-25 MG PO TABS: 1.0000 | ORAL_TABLET | Freq: Every day | ORAL | Status: DC

## 2019-11-15 MED ORDER — ONDANSETRON HCL 4 MG/2ML IJ SOLN
INTRAMUSCULAR | Status: DC | PRN
  Administered 2019-11-15: 4 mg via INTRAVENOUS

## 2019-11-15 MED ORDER — BUPIVACAINE HCL (PF) 0.5 % IJ SOLN
INTRAMUSCULAR | Status: DC | PRN
  Administered 2019-11-15: 20 mL

## 2019-11-15 MED ORDER — CEFAZOLIN SODIUM-DEXTROSE 2-4 GM/100ML-% IV SOLN
2.0000 g | INTRAVENOUS | Status: AC
  Administered 2019-11-15: 2 g via INTRAVENOUS

## 2019-11-15 MED ORDER — MEPERIDINE HCL 50 MG/ML IJ SOLN: 6.2500 mg | INTRAMUSCULAR | Status: DC | PRN

## 2019-11-15 MED ORDER — MONTELUKAST SODIUM 10 MG PO TABS
10.0000 mg | ORAL_TABLET | Freq: Every day | ORAL | Status: DC
  Filled 2019-11-15 (×4): qty 1

## 2019-11-15 MED ORDER — POVIDONE-IODINE 10 % EX SWAB
2.0000 "application " | Freq: Once | CUTANEOUS | Status: AC
  Administered 2019-11-15: 2 via TOPICAL

## 2019-11-15 MED ORDER — DEXMEDETOMIDINE (PRECEDEX) IN NS 20 MCG/5ML (4 MCG/ML) IV SYRINGE
PREFILLED_SYRINGE | INTRAVENOUS | Status: AC
  Filled 2019-11-15: qty 5

## 2019-11-15 MED ORDER — CHLORHEXIDINE GLUCONATE 0.12 % MT SOLN: 15.0000 mL | Freq: Once | OROMUCOSAL | Status: AC

## 2019-11-15 MED ORDER — OXYCODONE HCL 5 MG PO TABS: 5.0000 mg | ORAL_TABLET | Freq: Once | ORAL | Status: AC | PRN

## 2019-11-15 MED ORDER — OXYCODONE HCL 5 MG PO TABS
5.0000 mg | ORAL_TABLET | ORAL | Status: DC | PRN
  Administered 2019-11-15 – 2019-11-16 (×7): 10 mg via ORAL
  Administered 2019-11-17: 5 mg via ORAL
  Administered 2019-11-17 (×4): 10 mg via ORAL
  Administered 2019-11-18 (×3): 5 mg via ORAL
  Filled 2019-11-15 (×2): qty 2
  Filled 2019-11-15: qty 1
  Filled 2019-11-15: qty 2
  Filled 2019-11-15: qty 1
  Filled 2019-11-15 (×3): qty 2
  Filled 2019-11-15: qty 1
  Filled 2019-11-15: qty 2
  Filled 2019-11-15 (×2): qty 1
  Filled 2019-11-15 (×3): qty 2
  Filled 2019-11-15: qty 1

## 2019-11-15 SURGICAL SUPPLY — 60 items
ADH SKN CLS APL DERMABOND .7 (GAUZE/BANDAGES/DRESSINGS) ×2
APL PRP STRL LF DISP 70% ISPRP (MISCELLANEOUS) ×2
APL SRG 38 LTWT LNG FL B (MISCELLANEOUS) ×2
APPLICATOR ARISTA FLEXITIP XL (MISCELLANEOUS) ×1 IMPLANT
BAG DRN RND TRDRP ANRFLXCHMBR (UROLOGICAL SUPPLIES) ×2
BAG URINE DRAIN 2000ML AR STRL (UROLOGICAL SUPPLIES) ×3 IMPLANT
BLADE SURG SZ11 CARB STEEL (BLADE) ×3 IMPLANT
CANISTER SUCT 1200ML W/VALVE (MISCELLANEOUS) ×3 IMPLANT
CATH FOLEY 2WAY  5CC 16FR (CATHETERS) ×3
CATH FOLEY 2WAY 5CC 16FR (CATHETERS) ×2
CATH URTH 16FR FL 2W BLN LF (CATHETERS) ×2 IMPLANT
CHLORAPREP W/TINT 26 (MISCELLANEOUS) ×3 IMPLANT
DEFOGGER SCOPE WARMER CLEARIFY (MISCELLANEOUS) ×3 IMPLANT
DERMABOND ADVANCED (GAUZE/BANDAGES/DRESSINGS) ×1
DERMABOND ADVANCED .7 DNX12 (GAUZE/BANDAGES/DRESSINGS) ×2 IMPLANT
DEVICE SUTURE ENDOST 10MM (ENDOMECHANICALS) ×1 IMPLANT
DRAPE CAMERA CLOSED 9X96 (DRAPES) IMPLANT
DRSG TEGADERM 2-3/8X2-3/4 SM (GAUZE/BANDAGES/DRESSINGS) ×12 IMPLANT
GAUZE 4X4 16PLY RFD (DISPOSABLE) ×3 IMPLANT
GLOVE BIOGEL PI IND STRL 6.5 (GLOVE) ×2 IMPLANT
GLOVE BIOGEL PI IND STRL 7.0 (GLOVE) ×4 IMPLANT
GLOVE BIOGEL PI INDICATOR 6.5 (GLOVE) ×1
GLOVE BIOGEL PI INDICATOR 7.0 (GLOVE) ×2
GLOVE SURG SYN 6.5 ES PF (GLOVE) ×6 IMPLANT
GLOVE SURG SYN 6.5 PF PI (GLOVE) ×4 IMPLANT
GLOVE SURG SYN 7.0 (GLOVE) ×6 IMPLANT
GLOVE SURG SYN 7.0 PF PI (GLOVE) ×4 IMPLANT
GOWN STRL REUS W/ TWL LRG LVL3 (GOWN DISPOSABLE) ×6 IMPLANT
GOWN STRL REUS W/TWL LRG LVL3 (GOWN DISPOSABLE) ×9
GRASPER SUT TROCAR 14GX15 (MISCELLANEOUS) ×1 IMPLANT
HEMOSTAT ARISTA ABSORB 3G PWDR (HEMOSTASIS) ×1 IMPLANT
IRRIGATION STRYKERFLOW (MISCELLANEOUS) IMPLANT
IRRIGATOR STRYKERFLOW (MISCELLANEOUS) ×3
IV LACTATED RINGERS 1000ML (IV SOLUTION) ×6 IMPLANT
KIT PINK PAD W/HEAD ARE REST (MISCELLANEOUS) ×3
KIT PINK PAD W/HEAD ARM REST (MISCELLANEOUS) ×2 IMPLANT
KIT TURNOVER CYSTO (KITS) ×3 IMPLANT
MANIPULATOR VCARE STD CRV RETR (MISCELLANEOUS) ×1 IMPLANT
NS IRRIG 1000ML POUR BTL (IV SOLUTION) ×3 IMPLANT
NS IRRIG 500ML POUR BTL (IV SOLUTION) ×3 IMPLANT
OCCLUDER COLPOPNEUMO (BALLOONS) IMPLANT
PACK GYN LAPAROSCOPIC (MISCELLANEOUS) ×3 IMPLANT
PAD OB MATERNITY 4.3X12.25 (PERSONAL CARE ITEMS) ×3 IMPLANT
PAD PREP 24X41 OB/GYN DISP (PERSONAL CARE ITEMS) ×3 IMPLANT
SCISSORS METZENBAUM CVD 33 (INSTRUMENTS) IMPLANT
SET CYSTO W/LG BORE CLAMP LF (SET/KITS/TRAYS/PACK) ×3 IMPLANT
SET TRI-LUMEN FLTR TB AIRSEAL (TUBING) ×3 IMPLANT
SHEARS HARMONIC ACE PLUS 36CM (ENDOMECHANICALS) ×1 IMPLANT
SLEEVE ENDOPATH XCEL 5M (ENDOMECHANICALS) ×3 IMPLANT
SPONGE GAUZE 2X2 8PLY STRL LF (GAUZE/BANDAGES/DRESSINGS) ×12 IMPLANT
SURGILUBE 2OZ TUBE FLIPTOP (MISCELLANEOUS) ×3 IMPLANT
SUT ENDO VLOC 180-0-8IN (SUTURE) ×1 IMPLANT
SUT MNCRL 4-0 (SUTURE) ×3
SUT MNCRL 4-0 27XMFL (SUTURE) ×2
SUT VIC AB 0 CT1 36 (SUTURE) ×3 IMPLANT
SUTURE MNCRL 4-0 27XMF (SUTURE) ×2 IMPLANT
SYR 10ML LL (SYRINGE) ×3 IMPLANT
SYR 50ML LL SCALE MARK (SYRINGE) ×3 IMPLANT
TROCAR PORT AIRSEAL 8X100 (TROCAR) ×3 IMPLANT
TROCAR XCEL NON-BLD 5MMX100MML (ENDOMECHANICALS) ×3 IMPLANT
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
## 2019-11-15 NOTE — Op Note (Addendum)
Operative Report:  PRE-OP DIAGNOSIS: Menorrhagia   POST-OP DIAGNOSIS: Menorrhagia   PROCEDURE: Procedure(s): TOTAL LAPAROSCOPIC HYSTERECTOMY WITH SALPINGECTOMY CYSTOSCOPY  SURGEON: Adrian Prows MD  ASSISTANT: Malachy Mood MD- no other capable assistant available in a case requiring a high level assistant. .    ANESTHESIA: General endotracheal anesthesia  ESTIMATED BLOOD LOSS: 100  SPECIMENS: Uterus, Fallopian Tubes.  COMPLICATIONS: None  DISPOSITION: stable to PACU  FINDINGS: Intraabdominal adhesions were not noted.    PROCEDURE:  . The patient was taken to the OR where anesthesia was administed. She was prepped and draped in the normal sterile fashion in the dorsal lithotomy position in the [place] stirrups. A time out was performed. A Graves speculum was inserted, the cervix was grasped with a single tooth tenaculum and the endometrial cavity was sounded. The cervix was progressively dilated to a size 18 [place] with [employer] dilators. A V-Care uterine manipulator was inserted in the usual fashion without incident. Gloves were changed and attention was turned to the abdomen.    An infraumbilical vertival 10 mm skin incision was made with the scalpel after local anesthesia applied to the skin. A 5 mm trocar was inserted under direct visualization.  A pneumoperitoneum was obtained by insufflation of CO2 (opening pressure of 75mmHg) to 79mmHg. A diagnostic laparoscopy was performed yielding the previously described findings. Attention was turned to the left lower quadrant where after visualization of the inferior epigastric vessels a 46mm skin incision was made with the scalpel. A 5 mm Airseal port was inserted. The umbilical port was exchanged for a 11 mm laparoscopic port. Attention was turned to the right lower quadrant where after visualization of the inferior epigastric vessels a 97mm skin incision was made with the scalpel. A 5 mm laparoscopic port was inserted.    . Attention  was turned to the left aspect of the uterus, where after visualization of the ureter, the round ligament was coagulated and transected using the 53mm Harmonic Scapel. The anterior and posterior leafs of the broad ligament were dissected off as the anterior one was coagulated and transected in a caudal direction towards the cuff of the uterine manipulator.   The uterine-ovarian ligament and its blood vessels were carefully coagulated and transected using the Harmonic scapel.  Attention was turned to the right aspect of the uterus where the same procedure was performed.  The vesicouterine reflection of the peritoneum was dissected with the harmonic scapel and the bladder flap was created bluntly.  The uterine vessels were coagulated and transected bilaterally using first bipolar cautery and then the harmonic scapel. A 360 degree, circumferential colpotomy was done to completely amputate the uterus with cervix and tubes. Once the specimen was amputated it was delivered through the vagina.   . The colpotomy was repaired in a simple running fashion using a delayed absorbable suture with an endo-stitch device.    . The patient had had a previous tubal ligation.  Attention was then turned to the left fallopian tube which was recognized by visualization of the fimbria. The tube is excised in totality using the Harmonic scalpel. This procedure was repeated on the right side.  The fallopian tubes were removed through the umbilical port  . The cavity was copiously irrigated. A survey of the pelvic cavity revealed adequate hemostasis and no injury to bowel, bladder, or ureter.  . At this point the procedure was finalized. umbilical fascia incision was closed with a vicryl suture using the fascia closure device. All the instruments were removed from  the patient's body. Gas was expelled and patient is leveled.  Incisions are closed with 4-0 monocryl and skin adhesive.    .  A diagnostic cystoscopy was performed using  saline distension of bladder with no lesions or injuries noted.  Bilateral urine flow from each ureteral orifice was visualized.  . Patient was taken to the recovery room in stable condition.  All sponge, instrument, and needle counts are correct x2.    . Dr. Georgianne Fick assisted in all portion of this case with assistance in manipulation of tissue, dissection, and operation of the camera in a case requiring a high level assistant   Adrian Prows MD [facility] 11/15/2019 10:41 AM

## 2019-11-15 NOTE — Anesthesia Procedure Notes (Signed)
Procedure Name: Intubation Date/Time: 11/15/2019 7:40 AM Performed by: Ruffin Pyo, RN Pre-anesthesia Checklist: Patient identified, Emergency Drugs available, Suction available and Patient being monitored Patient Re-evaluated:Patient Re-evaluated prior to induction Oxygen Delivery Method: Circle system utilized Preoxygenation: Pre-oxygenation with 100% oxygen Induction Type: IV induction Ventilation: Mask ventilation without difficulty Laryngoscope Size: Miller and 2 Grade View: Grade I Tube type: Oral Tube size: 7.0 mm Number of attempts: 1 Airway Equipment and Method: Stylet and Oral airway Placement Confirmation: ETT inserted through vocal cords under direct vision,  positive ETCO2 and breath sounds checked- equal and bilateral Secured at: 22 cm Tube secured with: Tape Dental Injury: Teeth and Oropharynx as per pre-operative assessment

## 2019-11-15 NOTE — Interval H&P Note (Signed)
History and Physical Interval Note:  11/15/2019 7:11 AM  Anita Newton  has presented today for surgery, with the diagnosis of Menorrhagia.  The various methods of treatment have been discussed with the patient and family. After consideration of risks, benefits and other options for treatment, the patient has consented to  Procedure(s): TOTAL LAPAROSCOPIC HYSTERECTOMY WITH SALPINGECTOMY (Bilateral) CYSTOSCOPY (N/A) as a surgical intervention.  The patient's history has been reviewed, patient examined, no change in status, stable for surgery.  I have reviewed the patient's chart and labs.  Questions were answered to the patient's satisfaction.     [place]

## 2019-11-15 NOTE — Transfer of Care (Signed)
Immediate Anesthesia Transfer of Care Note  Patient: Anita Newton  Procedure(s) Performed: TOTAL LAPAROSCOPIC HYSTERECTOMY WITH SALPINGECTOMY (Bilateral ) CYSTOSCOPY (N/A )  Patient Location: PACU  Anesthesia Type:General  Level of Consciousness: awake, oriented, drowsy and patient cooperative  Airway & Oxygen Therapy: Patient Spontanous Breathing and Patient connected to face mask oxygen  Post-op Assessment: Report given to RN and Post -op Vital signs reviewed and stable  Post vital signs: Reviewed and stable  Last Vitals:  Vitals Value Taken Time  BP 105/61 11/15/19 1047  Temp    Pulse 92 11/15/19 1051  Resp 27 11/15/19 1051  SpO2 100 % 11/15/19 1051  Vitals shown include unvalidated device data.  Last Pain:  Vitals:   11/15/19 0618  PainSc: 0-No pain         Complications: No complications documented.

## 2019-11-16 ENCOUNTER — Encounter: Payer: Self-pay | Admitting: Obstetrics and Gynecology

## 2019-11-16 DIAGNOSIS — N920 Excessive and frequent menstruation with regular cycle: Secondary | ICD-10-CM | POA: Diagnosis present

## 2019-11-16 DIAGNOSIS — Z90710 Acquired absence of both cervix and uterus: Secondary | ICD-10-CM | POA: Diagnosis present

## 2019-11-16 DIAGNOSIS — R531 Weakness: Secondary | ICD-10-CM | POA: Diagnosis not present

## 2019-11-16 DIAGNOSIS — Z9851 Tubal ligation status: Secondary | ICD-10-CM | POA: Diagnosis not present

## 2019-11-16 DIAGNOSIS — D509 Iron deficiency anemia, unspecified: Secondary | ICD-10-CM | POA: Diagnosis present

## 2019-11-16 DIAGNOSIS — I10 Essential (primary) hypertension: Secondary | ICD-10-CM | POA: Diagnosis present

## 2019-11-16 DIAGNOSIS — I340 Nonrheumatic mitral (valve) insufficiency: Secondary | ICD-10-CM | POA: Diagnosis not present

## 2019-11-16 DIAGNOSIS — I361 Nonrheumatic tricuspid (valve) insufficiency: Secondary | ICD-10-CM | POA: Diagnosis not present

## 2019-11-16 DIAGNOSIS — Z6841 Body Mass Index (BMI) 40.0 and over, adult: Secondary | ICD-10-CM | POA: Diagnosis not present

## 2019-11-16 DIAGNOSIS — G259 Extrapyramidal and movement disorder, unspecified: Secondary | ICD-10-CM | POA: Diagnosis not present

## 2019-11-16 DIAGNOSIS — I499 Cardiac arrhythmia, unspecified: Secondary | ICD-10-CM | POA: Diagnosis present

## 2019-11-16 DIAGNOSIS — F444 Conversion disorder with motor symptom or deficit: Secondary | ICD-10-CM | POA: Diagnosis not present

## 2019-11-16 LAB — CBC
HCT: 29.7 % — ABNORMAL LOW (ref 36.0–46.0)
HCT: 29.3 % — ABNORMAL LOW (ref 36.0–46.0)
Hemoglobin: 9.9 g/dL — ABNORMAL LOW (ref 12.0–15.0)
Hemoglobin: 10.4 g/dL — ABNORMAL LOW (ref 12.0–15.0)
MCH: 29.5 pg (ref 26.0–34.0)
MCH: 30.4 pg (ref 26.0–34.0)
MCHC: 33.3 g/dL (ref 30.0–36.0)
MCHC: 35.5 g/dL (ref 30.0–36.0)
MCV: 88.4 fL (ref 80.0–100.0)
MCV: 85.7 fL (ref 80.0–100.0)
Platelets: 301 10*3/uL (ref 150–400)
Platelets: 306 10*3/uL (ref 150–400)
RBC: 3.36 MIL/uL — ABNORMAL LOW (ref 3.87–5.11)
RBC: 3.42 MIL/uL — ABNORMAL LOW (ref 3.87–5.11)
RDW: 12.1 % (ref 11.5–15.5)
RDW: 12.1 % (ref 11.5–15.5)
WBC: 10.1 10*3/uL (ref 4.0–10.5)
WBC: 9.9 10*3/uL (ref 4.0–10.5)
nRBC: 0 % (ref 0.0–0.2)
nRBC: 0 % (ref 0.0–0.2)

## 2019-11-16 NOTE — Anesthesia Postprocedure Evaluation (Signed)
Anesthesia Post Note  Patient: Anita Newton  Procedure(s) Performed: TOTAL LAPAROSCOPIC HYSTERECTOMY WITH SALPINGECTOMY (Bilateral ) CYSTOSCOPY (N/A )  Patient location during evaluation: PACU Anesthesia Type: General Level of consciousness: awake and alert and oriented Pain management: pain level controlled Vital Signs Assessment: post-procedure vital signs reviewed and stable Respiratory status: spontaneous breathing Cardiovascular status: blood pressure returned to baseline Anesthetic complications: no   No complications documented.   Last Vitals:  Vitals:   11/16/19 0350 11/16/19 0811  BP: 104/72 107/70  Pulse: 77 83  Resp: 18 18  Temp: 36.9 C 36.9 C  SpO2: 96% 100%    Last Pain:  Vitals:   11/16/19 0920  TempSrc:   PainSc: 6                  Jaidynn Balster
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
## 2019-11-17 ENCOUNTER — Inpatient Hospital Stay (HOSPITAL_COMMUNITY)
Admission: AD | Admit: 2019-11-17 | Discharge: 2019-11-17 | Disposition: A | Discharge status: Home / Self Care | Payer: Managed Care, Other (non HMO) | Source: Ambulatory Visit | Attending: Neurology | Admitting: Neurology

## 2019-11-17 ENCOUNTER — Inpatient Hospital Stay: Payer: Managed Care, Other (non HMO)

## 2019-11-17 DIAGNOSIS — I340 Nonrheumatic mitral (valve) insufficiency: Secondary | ICD-10-CM

## 2019-11-17 DIAGNOSIS — I361 Nonrheumatic tricuspid (valve) insufficiency: Secondary | ICD-10-CM

## 2019-11-17 LAB — ECHOCARDIOGRAM COMPLETE
AR max vel: 2.22 cm2
AV Area VTI: 1.75 cm2
AV Area mean vel: 1.71 cm2
AV Mean grad: 4.3 mmHg
AV Peak grad: 6.9 mmHg
Ao pk vel: 1.32 m/s
Area-P 1/2: 3.77 cm2
S' Lateral: 2.89 cm

## 2019-11-17 IMAGING — MR MR HEAD W/O CM
11 series · 40 of 48 positions shown · IV contrast (gadavist)
Comparison: None.

CLINICAL DATA: Neuro deficit, acute, stroke suspected.

EXAM:
MR HEAD WITHOUT CONTRAST
MR CIRCLE OF WILLIS WITHOUT CONTRAST
MRA OF THE NECK WITHOUT AND WITH CONTRAST
TECHNIQUE: Multiplanar, multiecho pulse sequences of the brain, circle of
willis and surrounding structures were obtained without intravenous
contrast. Angiographic images of the neck were obtained using MRA
technique without and with intravenous contrast.
CONTRAST:  9mL GADAVIST GADOBUTROL 1 MMOL/ML IV SOLN

[Series 5: ax dwi_tracew · axial · 3.0mm · 0.60mm/px · z∈[-135,+19]mm · 4 of 48 slices shown]
[im 1/48]
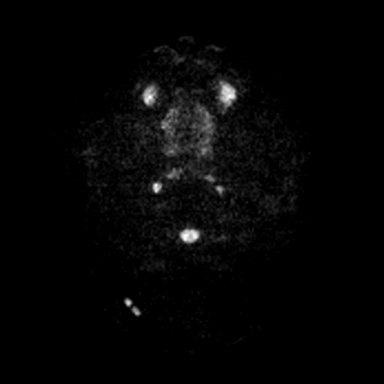
[im 16/48]
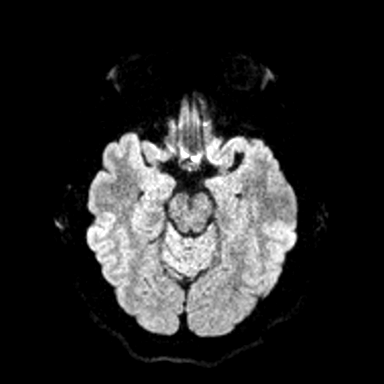
[im 32/48]
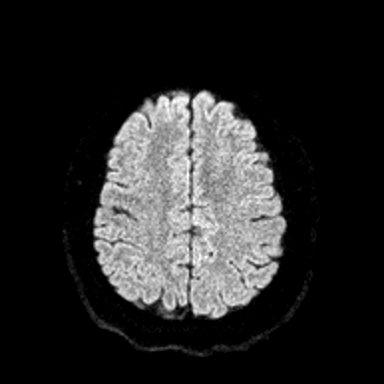
[im 48/48]
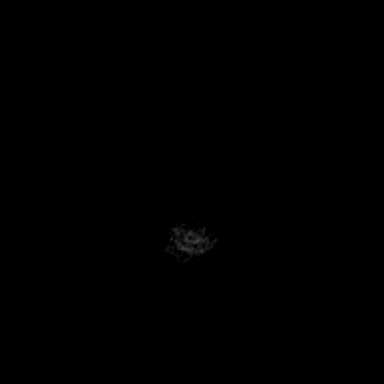

[Series 6: ax dwi_adc · axial · 3.0mm · 0.60mm/px · z∈[-135,+19]mm · 4 of 48 slices shown]
[im 1/48]
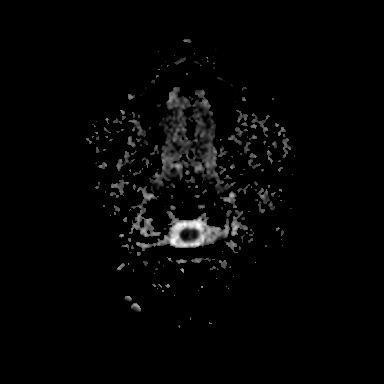
[im 16/48]
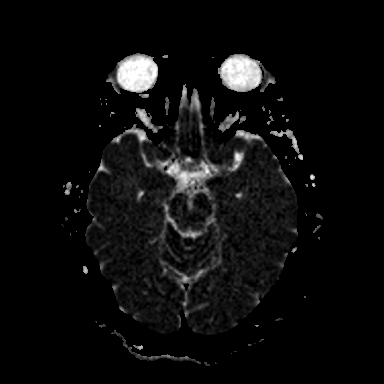
[im 32/48]
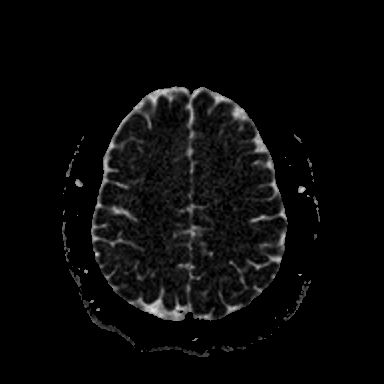
[im 48/48]
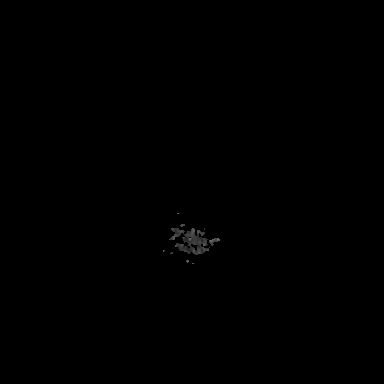

[Series 7: cor dwi_tracew · coronal · 5.0mm · 0.60mm/px · 3 of 36 slices shown]
[im 1/36]
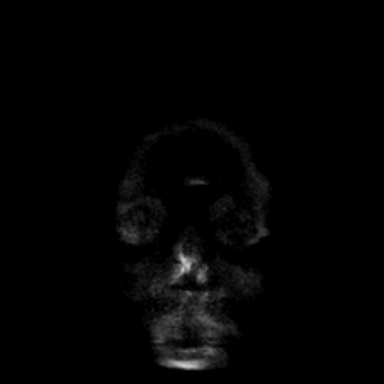
[im 18/36]
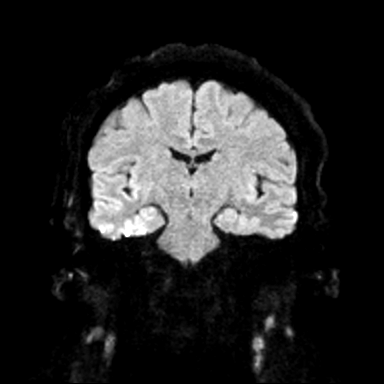
[im 36/36]
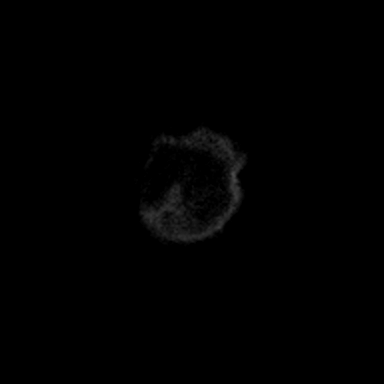

[Series 8: cor dwi_adc · coronal · 5.0mm · 0.60mm/px · 3 of 36 slices shown]
[im 1/36]
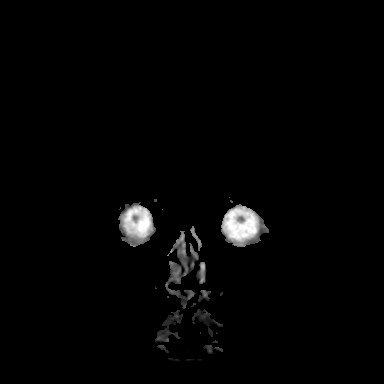
[im 18/36]
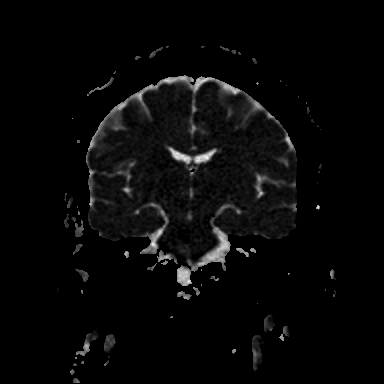
[im 36/36]
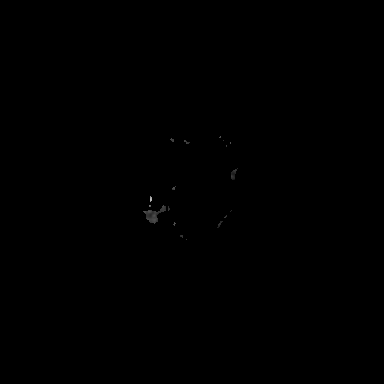

[Series 9: T1 · sagittal · 5.0mm · 0.62mm/px · 2 of 23 slices shown (1 of 2)]
[im 1/23]
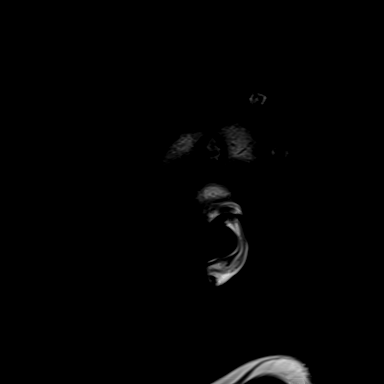
[im 23/23]
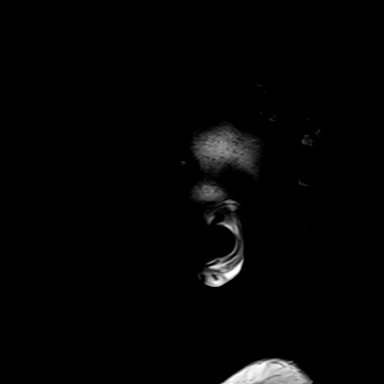

[Series 10: T2 · axial · 5.0mm · 0.53mm/px · z∈[-131,+12]mm · 2 of 25 slices shown (1 of 2)]
[im 1/25]
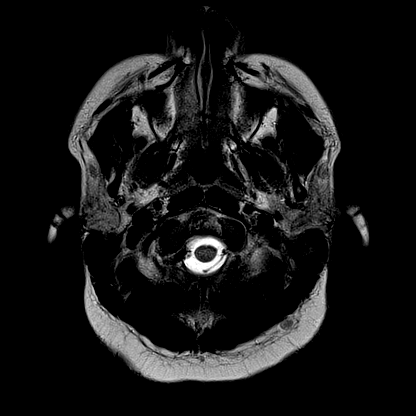
[im 25/25]
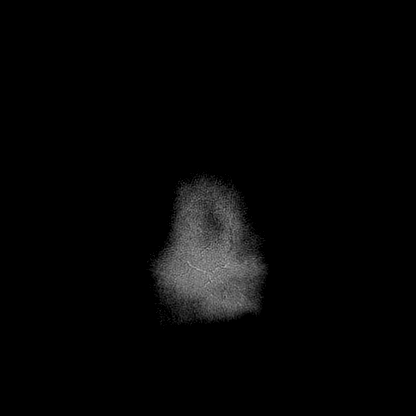

[Series 12: pha_images · axial · 3.0mm · 0.90mm/px · z∈[-147,+28]mm · 5 of 59 slices shown]
[im 1/59]
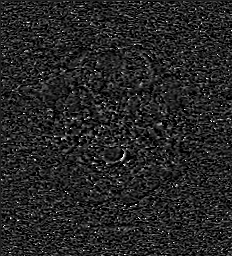
[im 15/59]
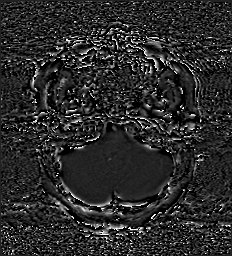
[im 30/59]
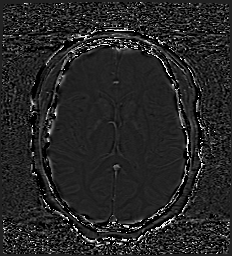
[im 44/59]
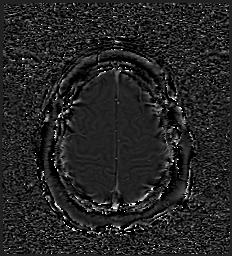
[im 59/59]
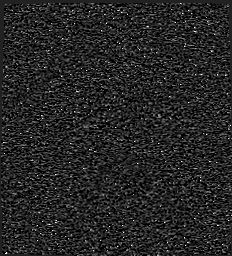

[Series 13: swi_images · axial · 3.0mm · 0.90mm/px · z∈[-147,-61]mm · 3 of 60 slices shown]
[im 1/60]
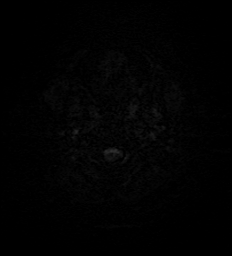
[im 15/60]
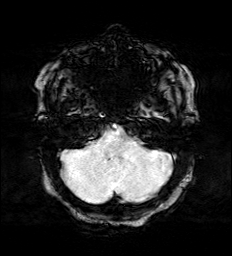
[im 30/60]
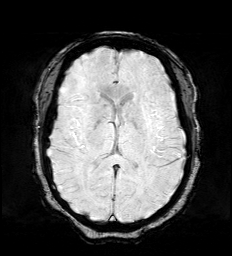

[Series 15: FLAIR · axial · 3.0mm · 0.53mm/px · z∈[-139,+21]mm · 4 of 55 slices shown]
[im 1/55]
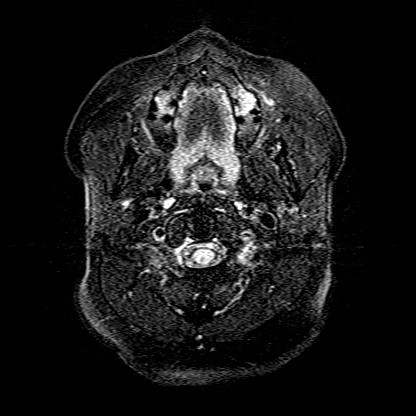
[im 19/55]
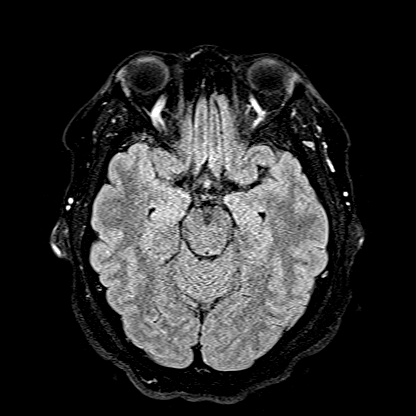
[im 37/55]
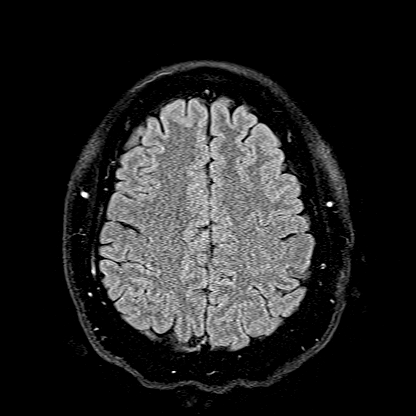
[im 55/55]
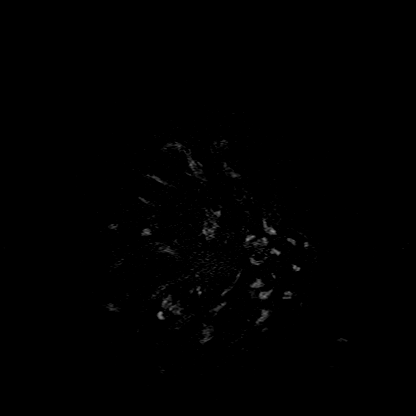

[Series 16: T1 · axial · 1.0mm · 0.98mm/px · z∈[-148,+26]mm · 8 of 176 slices shown (2 of 2)]
[im 1/176]
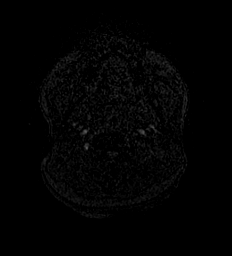
[im 27/176]
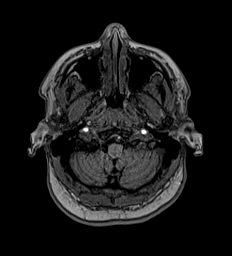
[im 54/176]
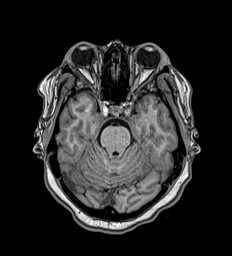
[im 81/176]
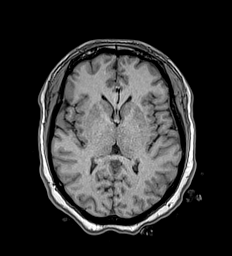
[im 95/176]
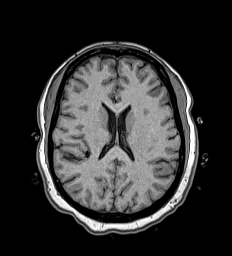
[im 122/176]
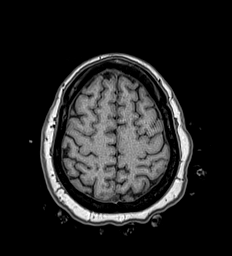
[im 149/176]
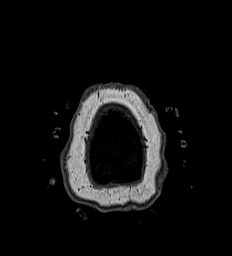
[im 176/176]
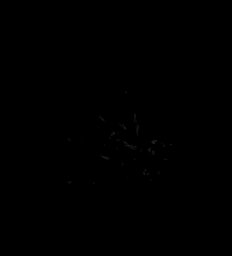

[Series 17: T2 · coronal · 5.0mm · 0.57mm/px · 2 of 29 slices shown (2 of 2)]
[im 1/29]
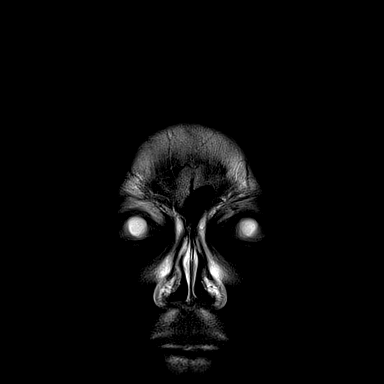
[im 29/29]
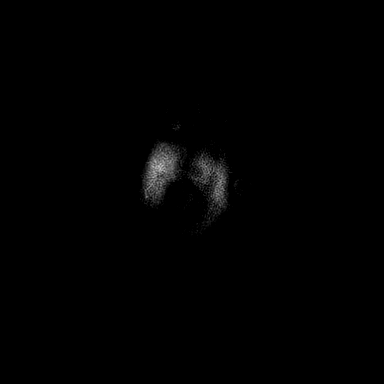

[40 of 48 positions shown; findings below may reference images not displayed]

FINDINGS: MR HEAD FINDINGS

Brain: No acute infarction, hemorrhage, hydrocephalus, extra-axial
collection or mass lesion. The brain parenchyma has normal
morphology and signal characteristics.

Vascular: Normal flow voids.

Skull and upper cervical spine: No focal lesion.

Sinuses/Orbits: Negative.

Other: None.

MR CIRCLE OF WILLIS FINDINGS

The visualized portions of the distal cervical and intracranial
internal carotid arteries are widely patent with normal flow related
enhancement. The bilateral anterior cerebral arteries and middle
cerebral arteries are widely patent with antegrade flow without
high-grade flow-limiting stenosis or proximal branch occlusion. No
intracranial aneurysm within the anterior circulation.

The vertebral arteries are widely patent with antegrade flow. The
right vertebral artery is dominant. The left vertebral artery ends
in PICA. The posterior inferior cerebral arteries are normal.
Vertebrobasilar junction and basilar artery are widely patent with
antegrade flow without evidence of basilar stenosis or aneurysm.
Posterior cerebral arteries are normal bilaterally. No intracranial
aneurysm within the posterior circulation.

MRA NECK FINDINGS

Aortic arch: Normal 3 vessel aortic branching pattern. The
visualized subclavian arteries are normal.

Right carotid system: Normal course and caliber without stenosis or
evidence of dissection.

Left carotid system: Normal course and caliber without stenosis or
evidence of dissection.

Vertebral arteries: Right dominant. Vertebral artery origins are
normal. Vertebral arteries are normal in course and caliber
throughout the neck without stenosis or evidence of dissection.
IMPRESSION: 1. Normal MRI of the brain.
2. Normal MRA of the head and neck.

## 2019-11-17 IMAGING — MR MR MRA HEAD W/O CM
1 series · 19 of 48 positions shown · IV contrast (gadavist)
Comparison: None.

CLINICAL DATA: Neuro deficit, acute, stroke suspected.

EXAM:
MR HEAD WITHOUT CONTRAST
MR CIRCLE OF WILLIS WITHOUT CONTRAST
MRA OF THE NECK WITHOUT AND WITH CONTRAST
TECHNIQUE: Multiplanar, multiecho pulse sequences of the brain, circle of
willis and surrounding structures were obtained without intravenous
contrast. Angiographic images of the neck were obtained using MRA
technique without and with intravenous contrast.
CONTRAST:  9mL GADAVIST GADOBUTROL 1 MMOL/ML IV SOLN

[Series 1: TOF · axial · 0.5mm · 0.41mm/px · z∈[-137,-40]mm · 19 of 205 slices shown]
[im 1/205]
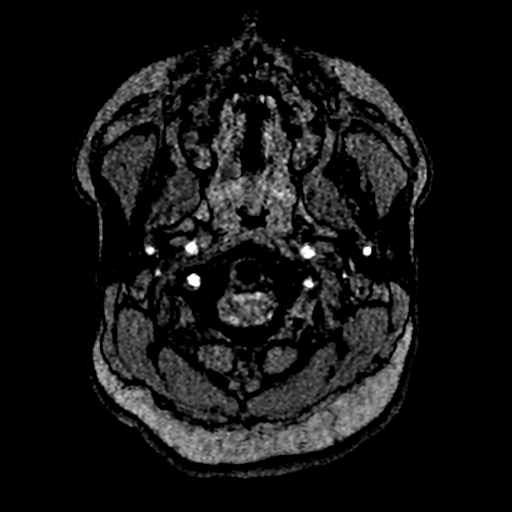
[im 5/205]
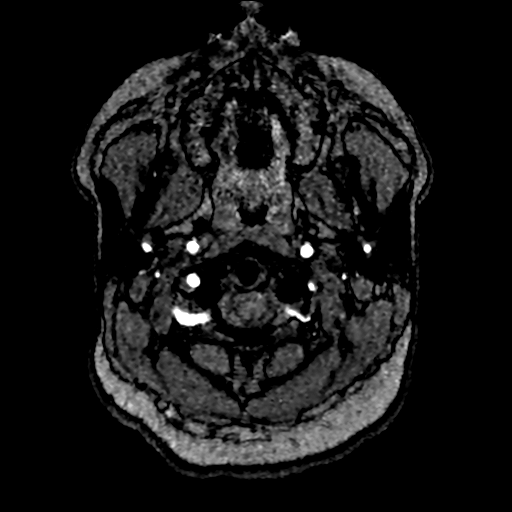
[im 9/205]
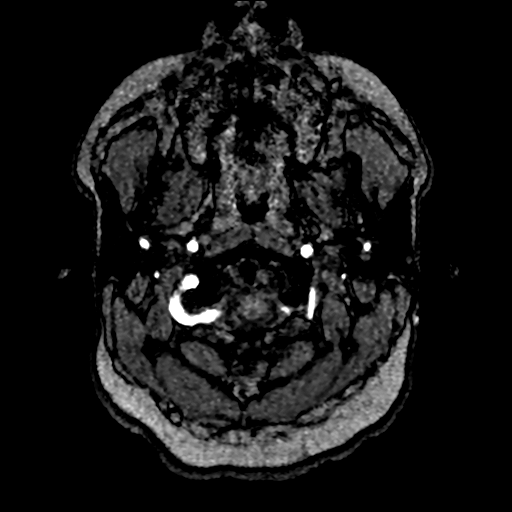
[im 14/205]
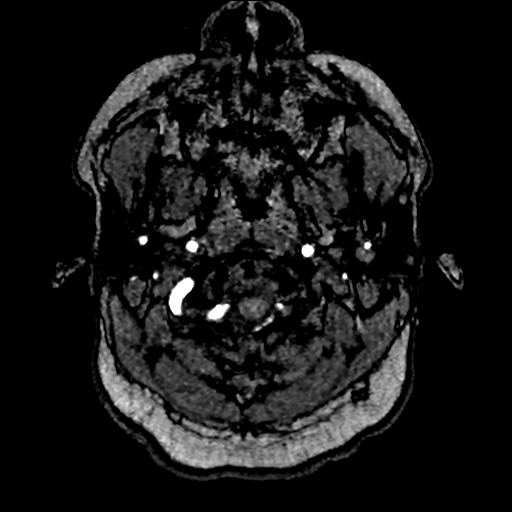
[im 18/205]
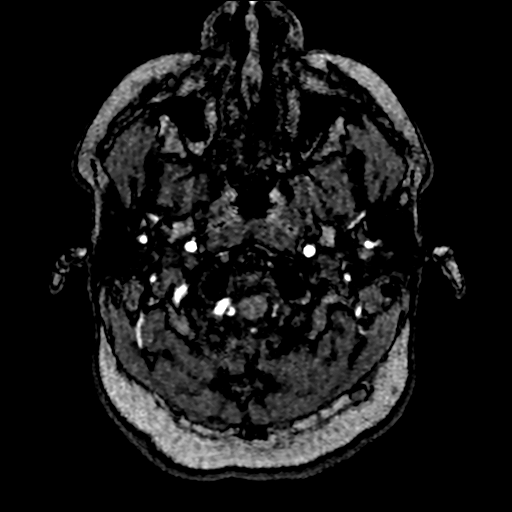
[im 22/205]
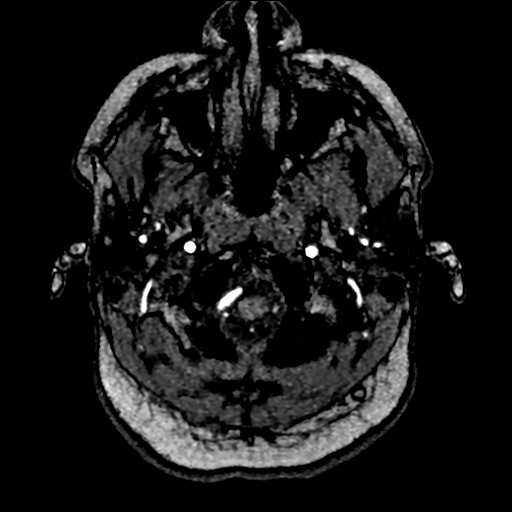
[im 27/205]
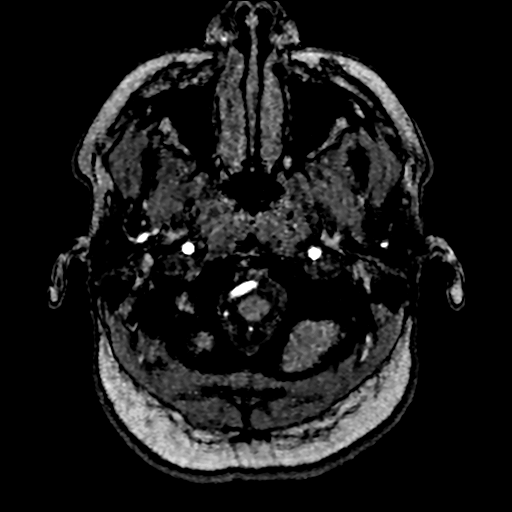
[im 31/205]
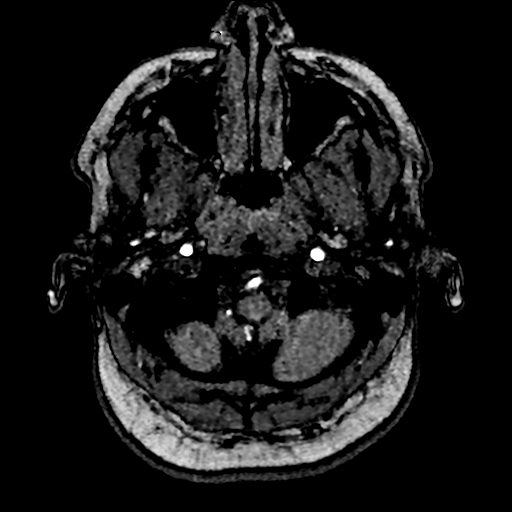
[im 35/205]
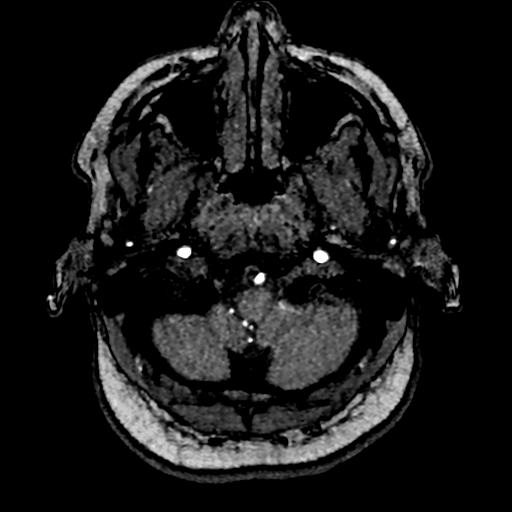
[im 40/205]
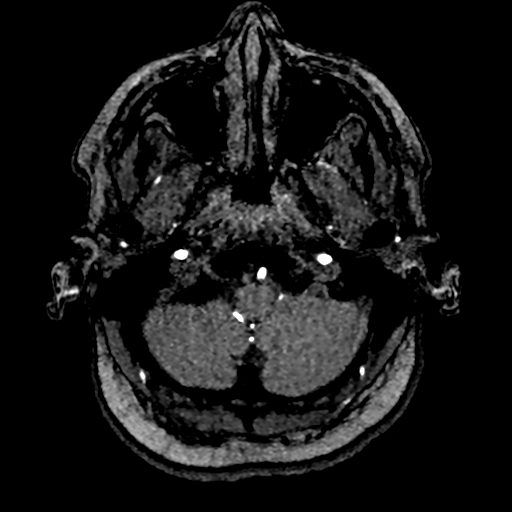
[im 44/205]
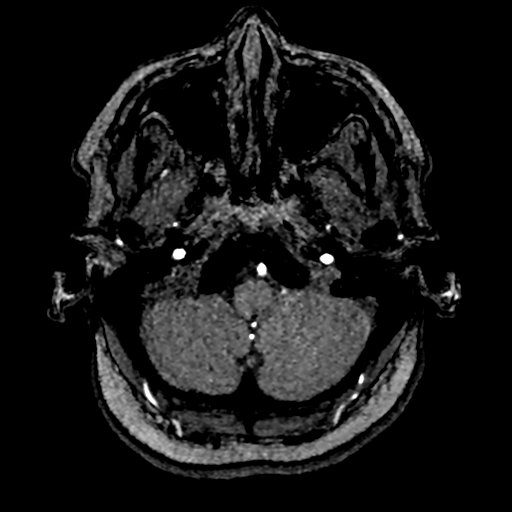
[im 66/205]
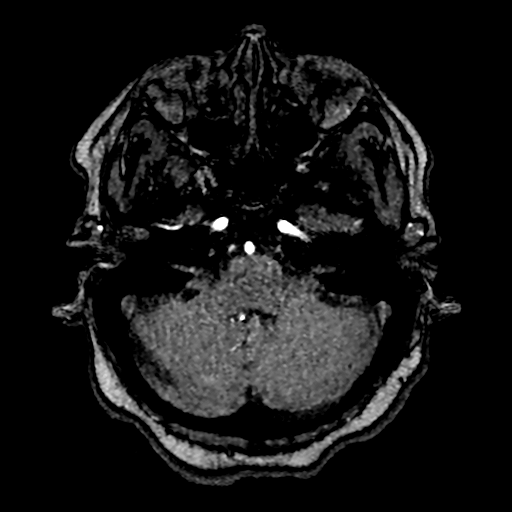
[im 92/205]
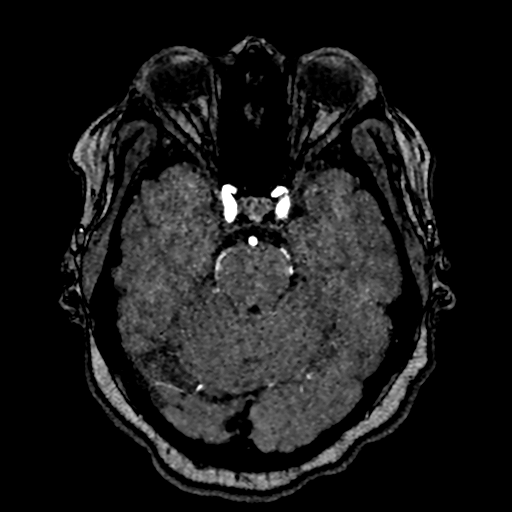
[im 105/205]
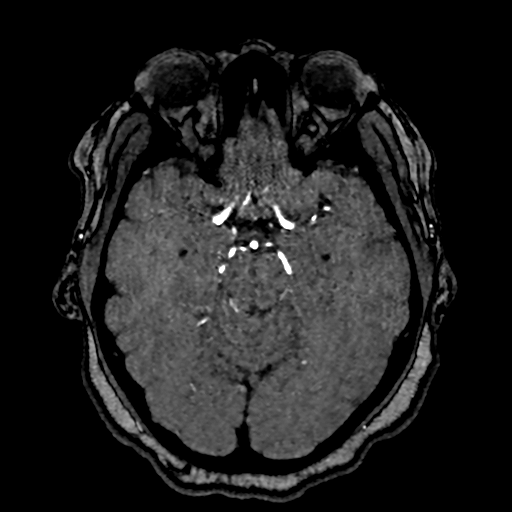
[im 118/205]
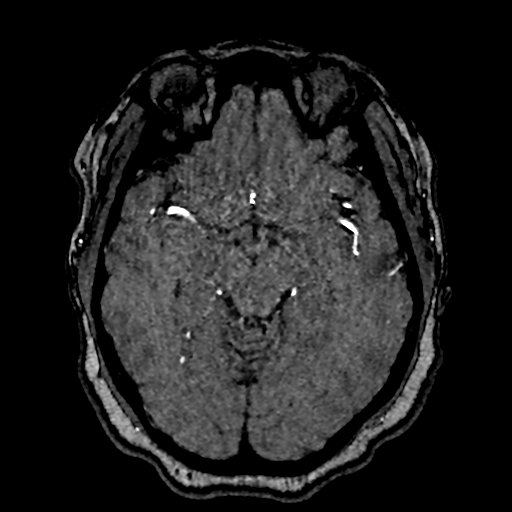
[im 144/205]
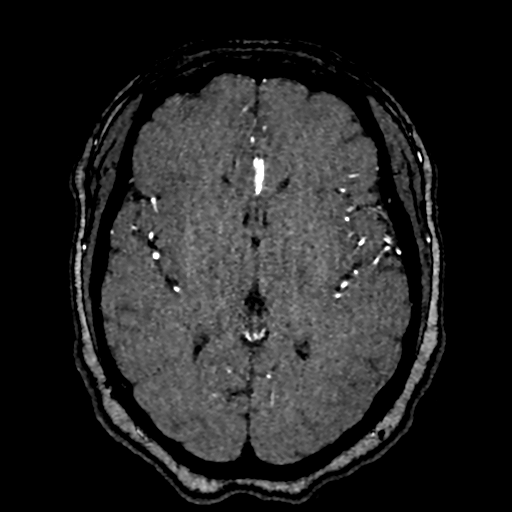
[im 170/205]
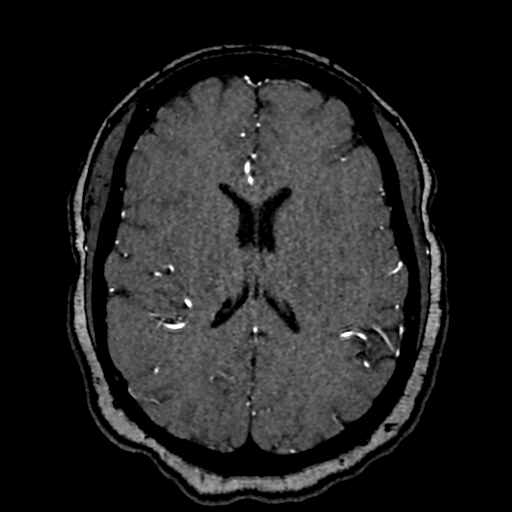
[im 174/205]
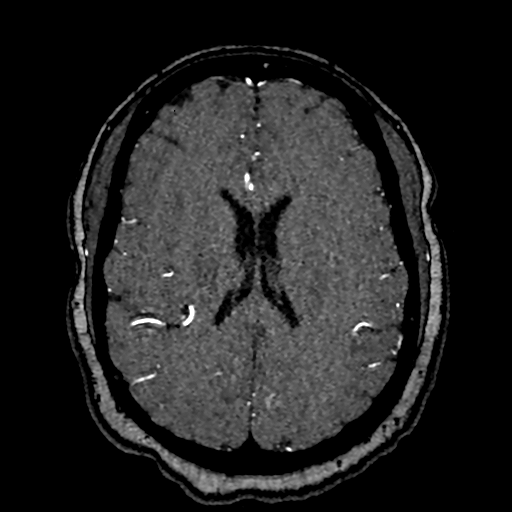
[im 196/205]
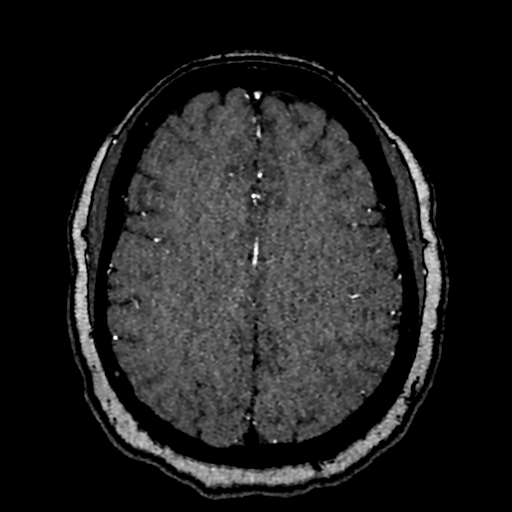

[19 of 48 positions shown; findings below may reference images not displayed]

FINDINGS: MR HEAD FINDINGS

Brain: No acute infarction, hemorrhage, hydrocephalus, extra-axial
collection or mass lesion. The brain parenchyma has normal
morphology and signal characteristics.

Vascular: Normal flow voids.

Skull and upper cervical spine: No focal lesion.

Sinuses/Orbits: Negative.

Other: None.

MR CIRCLE OF WILLIS FINDINGS

The visualized portions of the distal cervical and intracranial
internal carotid arteries are widely patent with normal flow related
enhancement. The bilateral anterior cerebral arteries and middle
cerebral arteries are widely patent with antegrade flow without
high-grade flow-limiting stenosis or proximal branch occlusion. No
intracranial aneurysm within the anterior circulation.

The vertebral arteries are widely patent with antegrade flow. The
right vertebral artery is dominant. The left vertebral artery ends
in PICA. The posterior inferior cerebral arteries are normal.
Vertebrobasilar junction and basilar artery are widely patent with
antegrade flow without evidence of basilar stenosis or aneurysm.
Posterior cerebral arteries are normal bilaterally. No intracranial
aneurysm within the posterior circulation.

MRA NECK FINDINGS

Aortic arch: Normal 3 vessel aortic branching pattern. The
visualized subclavian arteries are normal.

Right carotid system: Normal course and caliber without stenosis or
evidence of dissection.

Left carotid system: Normal course and caliber without stenosis or
evidence of dissection.

Vertebral arteries: Right dominant. Vertebral artery origins are
normal. Vertebral arteries are normal in course and caliber
throughout the neck without stenosis or evidence of dissection.
IMPRESSION: 1. Normal MRI of the brain.
2. Normal MRA of the head and neck.

## 2019-11-17 IMAGING — MR MR MRA NECK WO/W CM
4 of 5 series · 36 of 48 positions shown · IV contrast (9ml Gadavist)
Comparison: None.

CLINICAL DATA: Neuro deficit, acute, stroke suspected.

EXAM:
MR HEAD WITHOUT CONTRAST
MR CIRCLE OF WILLIS WITHOUT CONTRAST
MRA OF THE NECK WITHOUT AND WITH CONTRAST
TECHNIQUE: Multiplanar, multiecho pulse sequences of the brain, circle of
willis and surrounding structures were obtained without intravenous
contrast. Angiographic images of the neck were obtained using MRA
technique without and with intravenous contrast.
CONTRAST:  9mL GADAVIST GADOBUTROL 1 MMOL/ML IV SOLN

[Series 9: angio_fl3d_cor_pre_ttc=2.0s · coronal · 0.9mm · 0.85mm/px · 9 of 96 slices shown]
[im 1/96]
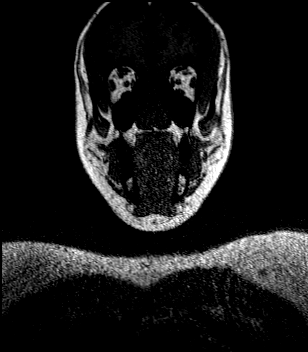
[im 12/96]
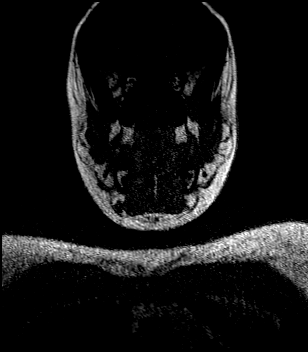
[im 24/96]
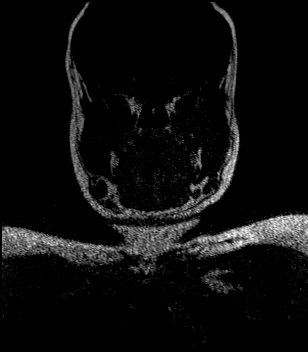
[im 36/96]
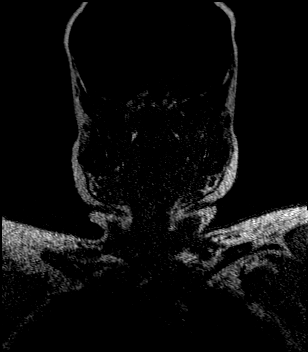
[im 48/96]
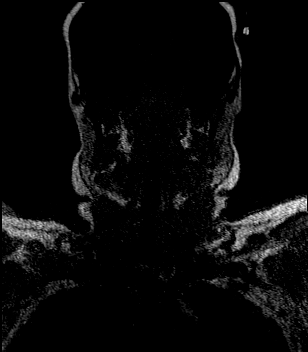
[im 60/96]
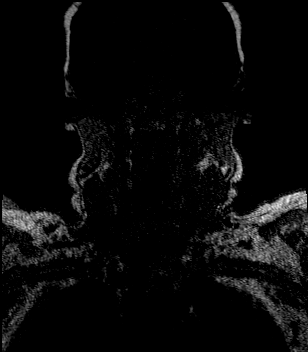
[im 72/96]
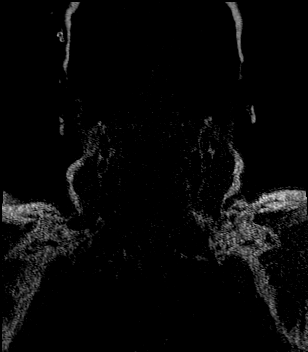
[im 84/96]
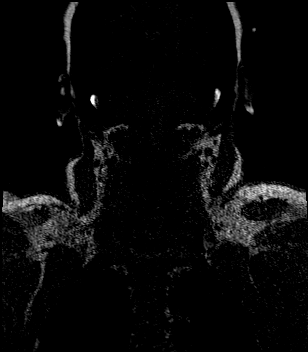
[im 96/96]
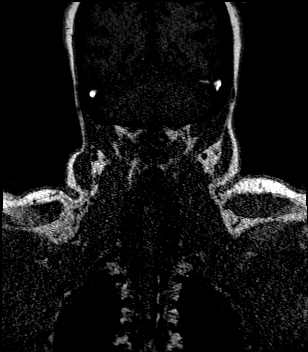

[Series 11: angio_fl3d_cor_post_ttc=2.0s · coronal · 0.9mm · 0.85mm/px · 9 of 91 slices shown]
[im 1/91]
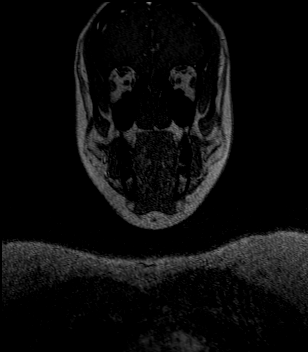
[im 12/91]
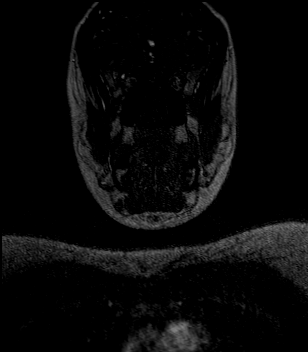
[im 23/91]
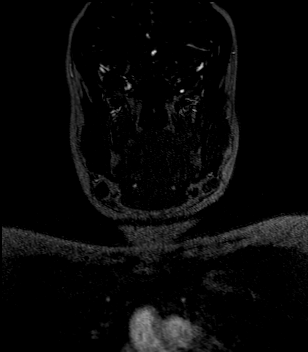
[im 34/91]
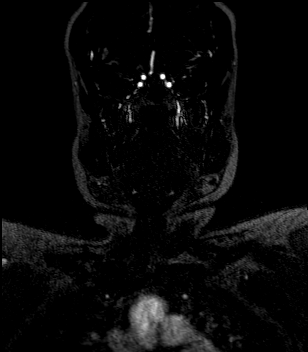
[im 46/91]
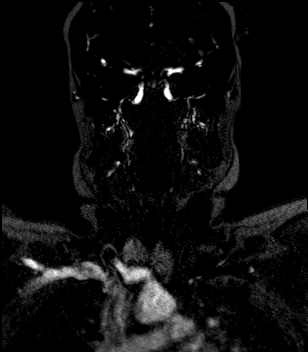
[im 57/91]
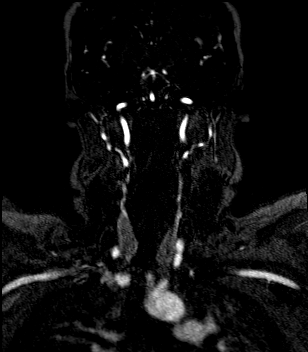
[im 68/91]
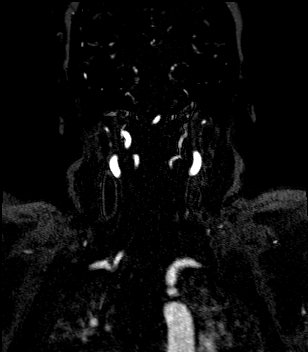
[im 79/91]
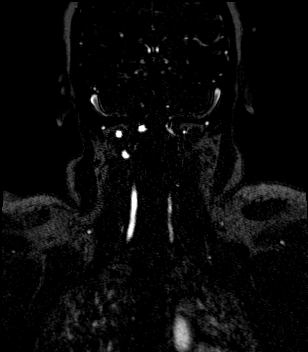
[im 91/91]
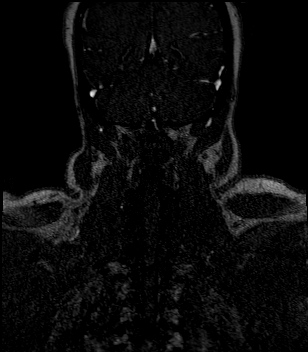

[Series 12: angio_fl3d_cor_post_ttc=2.0s_moco-adv · coronal · 0.9mm · 0.85mm/px · 9 of 93 slices shown]
[im 1/93]
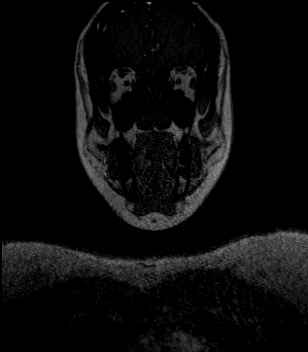
[im 12/93]
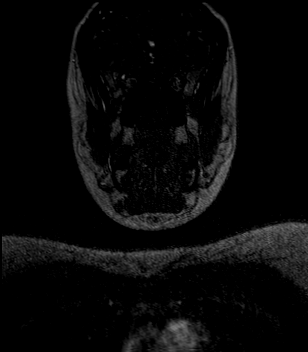
[im 24/93]
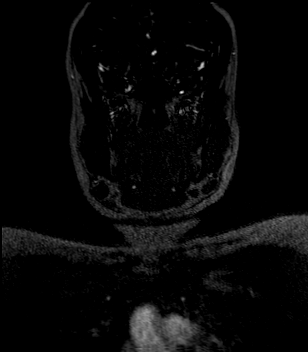
[im 35/93]
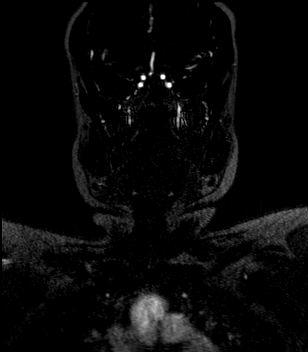
[im 47/93]
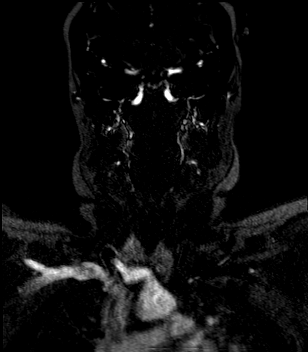
[im 58/93]
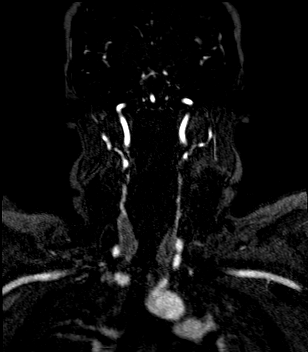
[im 70/93]
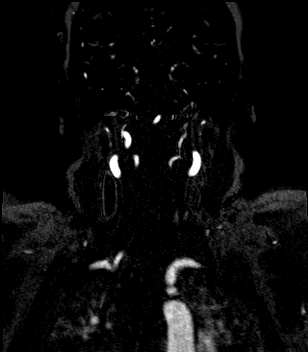
[im 81/93]
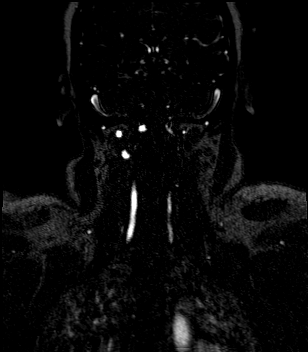
[im 93/93]
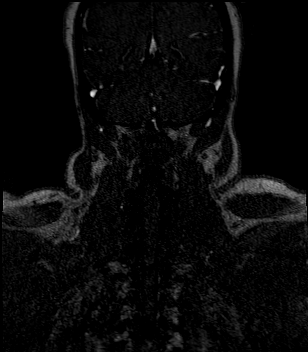

[Series 13: angio_fl3d_cor_post_ttc=2.0s_moco-adv_sub · coronal · 0.9mm · 0.85mm/px · 9 of 95 slices shown]
[im 1/95]
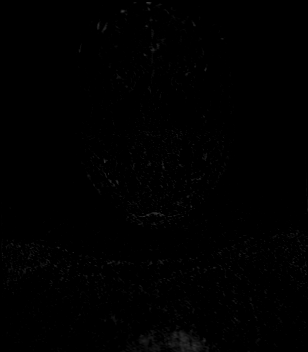
[im 12/95]
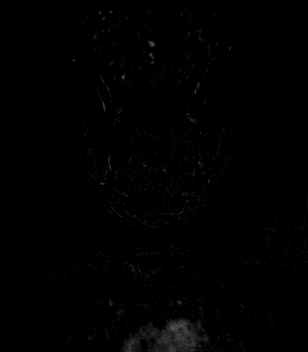
[im 24/95]
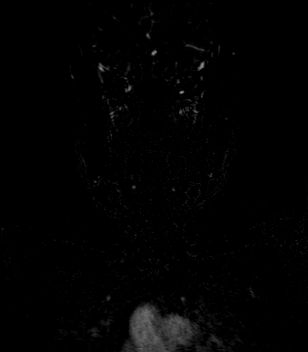
[im 36/95]
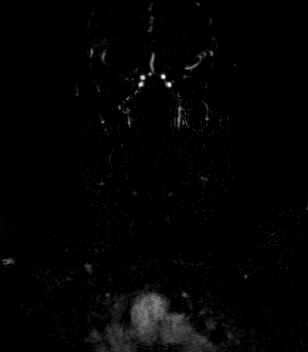
[im 48/95]
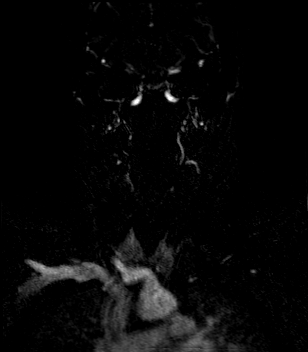
[im 59/95]
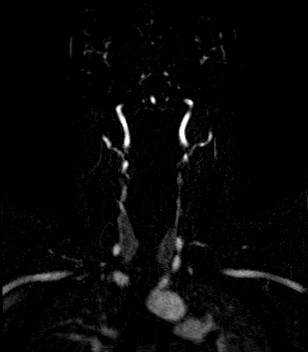
[im 71/95]
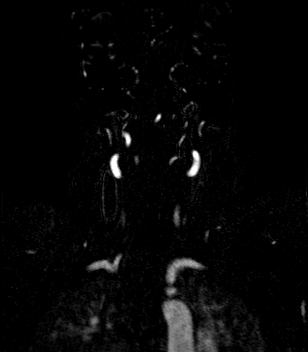
[im 83/95]
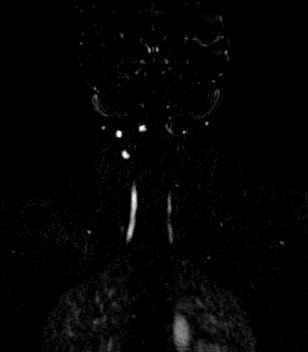
[im 95/95]
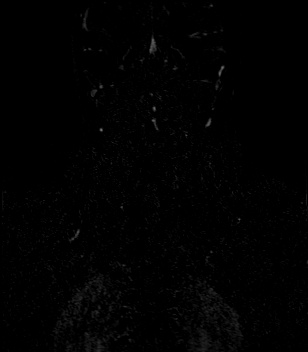

[36 of 48 positions shown; findings below may reference images not displayed]

FINDINGS: MR HEAD FINDINGS

Brain: No acute infarction, hemorrhage, hydrocephalus, extra-axial
collection or mass lesion. The brain parenchyma has normal
morphology and signal characteristics.

Vascular: Normal flow voids.

Skull and upper cervical spine: No focal lesion.

Sinuses/Orbits: Negative.

Other: None.

MR CIRCLE OF WILLIS FINDINGS

The visualized portions of the distal cervical and intracranial
internal carotid arteries are widely patent with normal flow related
enhancement. The bilateral anterior cerebral arteries and middle
cerebral arteries are widely patent with antegrade flow without
high-grade flow-limiting stenosis or proximal branch occlusion. No
intracranial aneurysm within the anterior circulation.

The vertebral arteries are widely patent with antegrade flow. The
right vertebral artery is dominant. The left vertebral artery ends
in PICA. The posterior inferior cerebral arteries are normal.
Vertebrobasilar junction and basilar artery are widely patent with
antegrade flow without evidence of basilar stenosis or aneurysm.
Posterior cerebral arteries are normal bilaterally. No intracranial
aneurysm within the posterior circulation.

MRA NECK FINDINGS

Aortic arch: Normal 3 vessel aortic branching pattern. The
visualized subclavian arteries are normal.

Right carotid system: Normal course and caliber without stenosis or
evidence of dissection.

Left carotid system: Normal course and caliber without stenosis or
evidence of dissection.

Vertebral arteries: Right dominant. Vertebral artery origins are
normal. Vertebral arteries are normal in course and caliber
throughout the neck without stenosis or evidence of dissection.
IMPRESSION: 1. Normal MRI of the brain.
2. Normal MRA of the head and neck.

## 2019-11-17 MED ORDER — POLYETHYLENE GLYCOL 3350 17 G PO PACK
17.0000 g | PACK | Freq: Two times a day (BID) | ORAL | Status: DC
  Administered 2019-11-17 – 2019-11-18 (×3): 17 g via ORAL
  Filled 2019-11-17 (×4): qty 1

## 2019-11-17 MED ORDER — ACETAMINOPHEN 500 MG PO TABS
1000.0000 mg | ORAL_TABLET | Freq: Four times a day (QID) | ORAL | Status: DC
  Administered 2019-11-17 – 2019-11-18 (×6): 1000 mg via ORAL
  Filled 2019-11-17 (×7): qty 2

## 2019-11-17 MED ORDER — GADOBUTROL 1 MMOL/ML IV SOLN
9.0000 mL | Freq: Once | INTRAVENOUS | Status: AC | PRN
  Administered 2019-11-17: 9 mL via INTRAVENOUS

## 2019-11-17 NOTE — Progress Notes (Signed)
*  PRELIMINARY RESULTS* Echocardiogram 2D Echocardiogram has been performed.  Anita Newton 11/17/2019, 2:21 PM
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
## 2019-11-17 NOTE — Progress Notes (Signed)
Physical Therapy Treatment Patient Details Name: Anita Newton MRN: [id number] DOB: 1980/12/06 Today's Date: 11/17/2019    History of Present Illness Pt is a 39 y.o. female s/p total laparoscopic hysterectomy with salpingectomy cystoscopy 10/5 secondary to menorrhagia.  PMH includes anxiety, back pain, htn, h/o eye surgery, BV, anemia, eclampsia.    PT Comments    Pt reporting 7-8/10 abdominal pain beginning/end of session (pt received pain medication prior to PT session).  R UE appearing stronger today but R LE weakness appearing similar to yesterday.  SBA semi-supine to sitting edge of bed; CGA with transfers (increased effort and time to perform); and CGA to SBA ambulating 160 feet with RW (decreased gait speed noted).  Pt demonstrating decreased stance time, step length, and heel-strike R LE especially with increased distance ambulating.  R LE appearing "stiffer" with ambulation and pt reporting that if she relaxed her R LE, she felt like her knee would buckle.  Will continue to focus on strengthening and progressive functional mobility per pt tolerance.   Follow Up Recommendations  Home health PT (assist with stairs)     Equipment Recommendations  Rolling walker with 5" wheels;3in1 (PT) (youth sized RW)    Recommendations for Other Services       Precautions / Restrictions Precautions Precautions: Fall Precaution Comments: Abdominal binder Restrictions Weight Bearing Restrictions: No    Mobility  Bed Mobility Overal bed mobility: Needs Assistance Bed Mobility: Supine to Sit     Supine to sit: Supervision;HOB elevated     General bed mobility comments: increased effort and time to peform on own  Transfers Overall transfer level: Needs assistance Equipment used: Rolling walker (2 wheeled) Transfers: Sit to/from Stand Sit to Stand: Min guard         General transfer comment: vc's for UE placement for transfers; increased effort and time to stand from bed up to  walker and also increased time to slowly sit down onto bed  Ambulation/Gait Ambulation/Gait assistance: Min guard;Supervision Gait Distance (Feet): 160 Feet Assistive device: Rolling walker (2 wheeled)   Gait velocity: decreased (especially with increased distance ambulating)   General Gait Details: decreased stance time, step length, and heelstrike noted R LE especially with increased distance ambulating; pt with "stiffer" R LE during ambulation and pt reporting if she relaxed R LE she felt R knee would buckle   Stairs             Wheelchair Mobility    Modified Rankin (Stroke Patients Only)       Balance Overall balance assessment: Needs assistance Sitting-balance support: No upper extremity supported;Feet supported Sitting balance-Leahy Scale: Normal Sitting balance - Comments: steady sitting reaching outside BOS   Standing balance support: No upper extremity supported Standing balance-Leahy Scale: Good Standing balance comment: steady static standing with more weight noted through L LE                            Cognition Arousal/Alertness: Awake/alert Behavior During Therapy: WFL for tasks assessed/performed Overall Cognitive Status: Within Functional Limits for tasks assessed                                        Exercises      General Comments General comments (skin integrity, edema, etc.): abdominal binder in place beginning/end of session.  Nursing cleared pt for participation in physical  therapy.  Pt agreeable to PT session.      Pertinent Vitals/Pain Pain Assessment: 0-10 Pain Score: 8  Pain Location: lower abdominal pain and R groin pain Pain Descriptors / Indicators: Aching;Constant;Discomfort;Operative site guarding;Guarding;Pressure (pressure lower abdomen) Pain Intervention(s): Limited activity within patient's tolerance;Monitored during session;Premedicated before session;Repositioned  Vitals (HR and O2 on room  air) stable and WFL throughout treatment session. BP standing prior to ambulation 138/92 and BP standing post ambulation 145/91 (nurse notified).    Home Living                      Prior Function            PT Goals (current goals can now be found in the care plan section) Acute Rehab PT Goals Patient Stated Goal: to improve R sided strength and improve pain PT Goal Formulation: With patient Time For Goal Achievement: 11/30/19 Potential to Achieve Goals: Good Progress towards PT goals: Progressing toward goals    Frequency    Min 2X/week      PT Plan Current plan remains appropriate    Co-evaluation              AM-PAC PT "6 Clicks" Mobility   Outcome Measure  Help needed turning from your back to your side while in a flat bed without using bedrails?: A Little Help needed moving from lying on your back to sitting on the side of a flat bed without using bedrails?: A Little Help needed moving to and from a bed to a chair (including a wheelchair)?: A Little Help needed standing up from a chair using your arms (e.g., wheelchair or bedside chair)?: A Little Help needed to walk in hospital room?: A Little Help needed climbing 3-5 steps with a railing? : A Little 6 Click Score: 18    End of Session Equipment Utilized During Treatment: Gait belt (positioned up high away from abdominal incision) Activity Tolerance: Patient limited by pain Patient left:  (sitting on edge of bed with Neurologist present) Nurse Communication: Mobility status;Precautions;Other (comment) (pt's R UE and LE weakness) PT Visit Diagnosis: Other abnormalities of gait and mobility (R26.89);Muscle weakness (generalized) (M62.81);Difficulty in walking, not elsewhere classified (R26.2);Pain Pain - Right/Left: Right Pain - part of body: Leg     Time: 0921-1001 PT Time Calculation (min) (ACUTE ONLY): 40 min  Charges:  $Gait Training: 8-22 mins $Therapeutic Exercise: 8-22  mins $Therapeutic Activity: 8-22 mins                     Leitha Bleak, PT 11/17/19, 3:19 PM

## 2019-11-18 DIAGNOSIS — F444 Conversion disorder with motor symptom or deficit: Secondary | ICD-10-CM

## 2019-11-18 LAB — SURGICAL PATHOLOGY

## 2019-11-18 MED ORDER — OXYCODONE HCL 5 MG PO TABS: 5.0000 mg | ORAL_TABLET | Freq: Four times a day (QID) | ORAL | 0 refills | Status: DC | PRN

## 2019-11-18 MED ORDER — ACETAMINOPHEN 500 MG PO TABS: 1000.0000 mg | ORAL_TABLET | Freq: Four times a day (QID) | ORAL | 0 refills | Status: AC

## 2019-11-18 MED ORDER — POLYETHYLENE GLYCOL 3350 17 G PO PACK: 17.0000 g | PACK | Freq: Two times a day (BID) | ORAL | 0 refills | Status: DC

## 2019-11-18 MED ORDER — DOCUSATE SODIUM 100 MG PO CAPS: 100.0000 mg | ORAL_CAPSULE | Freq: Two times a day (BID) | ORAL | 0 refills | Status: DC | PRN

## 2019-11-18 MED ORDER — SIMETHICONE 80 MG PO CHEW: 80.0000 mg | CHEWABLE_TABLET | Freq: Four times a day (QID) | ORAL | 0 refills | Status: AC

## 2019-11-18 NOTE — Progress Notes (Signed)
Physical Therapy Treatment Patient Details Name: Anita Newton MRN: [id number] DOB: 1980/10/08 Today's Date: 11/18/2019    History of Present Illness Pt is a 39 y.o. female s/p total laparoscopic hysterectomy with salpingectomy cystoscopy 10/5 secondary to menorrhagia.  PMH includes anxiety, back pain, htn, h/o eye surgery, BV, anemia, eclampsia.Subsequently has R sided weakness LE>UE.    PT Comments    Patient received in bed, reports she continues to have significant weakness in R LE. Trace movement grossly. She was instructed in and performed R LE rom exercises with gait belt assist. She was given handout and belt to take home. She ambulated to bathroom with rw and supervision.  She is able to return demo exercises. All questions answered. Patient is planning to go home today, if not, she will continue to benefit from skilled PT to improve functional independence, strength and safety.     Follow Up Recommendations  Outpatient PT      Equipment Recommendations       Recommendations for Other Services       Precautions / Restrictions Precautions Precaution Comments: Abdominal binder, mod fall Restrictions Weight Bearing Restrictions: No    Mobility  Bed Mobility Overal bed mobility: Modified Independent Bed Mobility: Supine to Sit     Supine to sit: Modified independent (Device/Increase time);HOB elevated     General bed mobility comments: increased effort and time to peform on own  Transfers Overall transfer level: Needs assistance Equipment used: Rolling walker (2 wheeled) Transfers: Sit to/from Stand Sit to Stand: Supervision            Ambulation/Gait Ambulation/Gait assistance: Supervision Gait Distance (Feet): 10 Feet Assistive device: Rolling walker (2 wheeled)   Gait velocity: decreased   General Gait Details: patient with decreased step length on right, decreased foot clearance.   Stairs             Wheelchair Mobility    Modified  Rankin (Stroke Patients Only)       Balance Overall balance assessment: Modified Independent Sitting-balance support: Feet supported Sitting balance-Leahy Scale: Normal     Standing balance support: Bilateral upper extremity supported;During functional activity Standing balance-Leahy Scale: Good                              Cognition Arousal/Alertness: Awake/alert Behavior During Therapy: WFL for tasks assessed/performed Overall Cognitive Status: Within Functional Limits for tasks assessed                                        Exercises Other Exercises Other Exercises: Instructed patient in R LE exercises with gait belt assist to include: AP, heel slides, hip abd, hip add, quad sets, LAQ in sitting and long sitting. Handout given.    General Comments        Pertinent Vitals/Pain Pain Assessment: Faces Faces Pain Scale: Hurts a little bit Pain Location: lower abdominal pain and R groin pain Pain Descriptors / Indicators: Aching;Constant;Discomfort;Operative site guarding;Guarding;Pressure Pain Intervention(s): Monitored during session    Home Living                      Prior Function            PT Goals (current goals can now be found in the care plan section) Acute Rehab PT Goals Patient Stated Goal: to improve R  sided strength PT Goal Formulation: With patient Time For Goal Achievement: 11/30/19 Potential to Achieve Goals: Good Progress towards PT goals: Progressing toward goals    Frequency    Min 2X/week      PT Plan Current plan remains appropriate    Co-evaluation              AM-PAC PT "6 Clicks" Mobility   Outcome Measure  Help needed turning from your back to your side while in a flat bed without using bedrails?: None Help needed moving from lying on your back to sitting on the side of a flat bed without using bedrails?: None Help needed moving to and from a bed to a chair (including a  wheelchair)?: A Little Help needed standing up from a chair using your arms (e.g., wheelchair or bedside chair)?: None Help needed to walk in hospital room?: A Little Help needed climbing 3-5 steps with a railing? : A Little 6 Click Score: 21    End of Session Equipment Utilized During Treatment: Gait belt Activity Tolerance: Patient tolerated treatment well Patient left: Other (comment) (in bathroom, RN notified) Nurse Communication: Mobility status PT Visit Diagnosis: Other abnormalities of gait and mobility (R26.89);Muscle weakness (generalized) (M62.81);Difficulty in walking, not elsewhere classified (R26.2)     Time: 5974-7185 PT Time Calculation (min) (ACUTE ONLY): 44 min  Charges:  $Gait Training: 8-22 mins $Therapeutic Exercise: 23-37 mins                     Jadea Shiffer, PT, GCS 11/18/19,3:16 PM
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
## 2019-11-18 NOTE — TOC Initial Note (Addendum)
Transition of Care [facility]) - Initial/Assessment Note    Patient Details  Name: KYIAH CANEPA MRN: [id number] Date of Birth: 28-Dec-1980  Transition of [facility]) CM/SW Contact:    Magnus Ivan, LCSW Phone Number: 11/18/2019, 9:04 AM  Clinical Narrative:             PT recommending HHPT, 3 in 1, and youth sized rolling walker.   CSW spoke with patient. Patient reported she would prefer to go to Outpatient Therapy at [facility] on [address] as it is near her house. She asked about copays with her insurance. Patient said home address is [address] in [place]. CSW spoke to [employer] who reported there would be a $60 copay per visit and patient can self refer, no MD referral needed with her insurance. (Per [place] patients pay $75+ copay per visit for home health PT.) CSW informed patient of this. Patient reported she cannot afford these copays at this time so wants to decline PT services at discharge. She is aware she can make a self referral to [facility] in the future if she would like. She reported she plans to try some home exercises, CSW asked PT to provide information on home exercises prior to discharge if possible.  Patient is agreeable to 3 in 1 and youth sized RW. Referral made to [facility] and [facility]ardene Celeste. Asked MD to put in orders for DME.   Patient denied additional needs prior to discharge.    Expected Discharge Plan: OP Rehab Barriers to Discharge: Continued Medical Work up   Patient Goals and CMS Choice Patient states their goals for this hospitalization and ongoing recovery are:: would prefer home with outpatient PT CMS Medicare.gov Compare Post Acute Care list provided to:: Patient Choice offered to / list presented to : Patient  Expected Discharge Plan and Services Expected Discharge Plan: OP Rehab       Living arrangements for the past 2 months: Single Family Home                   DME Agency:  [facility] Date DME Agency Contacted: 11/18/19   Representative spoke with at DME Agency: Andree Coss            Prior Living Arrangements/Services Living arrangements for the past 2 months: [place] Lives with:: Adult Children Patient language and need for interpreter reviewed:: Yes Do you feel safe going back to the place where you live?: Yes      Need for Family Participation in Patient Care: Yes (Comment) Care giver support system in place?: Yes (comment)   Criminal Activity/Legal Involvement Pertinent to Current Situation/Hospitalization: No - Comment as needed  Activities of Daily Living Home Assistive Devices/Equipment: Eyeglasses ADL Screening (condition at time of admission) Patient's cognitive ability adequate to safely complete daily activities?: Yes Is the patient deaf or have difficulty hearing?: No Does the patient have difficulty seeing, even when wearing glasses/contacts?: No Does the patient have difficulty concentrating, remembering, or making decisions?: No Patient able to express need for assistance with ADLs?: Yes Does the patient have difficulty dressing or bathing?: No Independently performs ADLs?: Yes (appropriate for developmental age) Does the patient have difficulty walking or climbing stairs?: Yes Weakness of Legs: Right Weakness of Arms/Hands: None  Permission Sought/Granted Permission sought to share information with : Facility Art therapist granted to share information with : Yes, Verbal Permission Granted     Permission granted to share info w AGENCY:  Home Health, Outpatient Therapy, DME companies        Emotional Assessment   Attitude/Demeanor/Rapport: Engaged   Orientation: : Oriented to Self, Oriented to Place, Oriented to  Time, Oriented to Situation Alcohol / Substance Use: Not Applicable Psych Involvement: No (comment)  Admission diagnosis:  S/P laparoscopic hysterectomy [Z90.710] S/P hysterectomy  [Z90.710] Patient Active Problem List   Diagnosis Date Noted  . S/P hysterectomy 11/16/2019  . S/P laparoscopic hysterectomy 11/15/2019  . Menorrhagia with regular cycle   . Morbid obesity ([place]) 04/16/2018  . Encounter for surveillance of injectable contraceptive 02/23/2018  . DUB (dysfunctional uterine bleeding) 07/16/2017  . B12 deficiency 07/08/2016  . History of iron deficiency anemia 07/08/2016  . Seasonal allergic rhinitis 05/16/2015  . History of eclampsia 12/15/2014  . Hypertension, benign 12/15/2014  . Large breasts 12/15/2014  . Obesity (BMI 35.0-39.9 without comorbidity) 12/15/2014  . Thoracic back pain 12/15/2014  . Vitamin D deficiency 12/15/2014   PCP:  Steele Sizer, MD Pharmacy:   [id number] - [place], [place] [address] [address] Phone: [phone] Fax: [phone]     Social Determinants of Health (SDOH) Interventions    Readmission Risk Interventions No flowsheet data found.

## 2019-11-18 NOTE — Discharge Summary (Signed)
Physician Discharge Summary   Patient ID: Anita Newton [id number] 39 y.o. [birth year]  Admit date: 11/15/2019  Discharge date and time: No discharge date for patient encounter.   Admitting Physician: Homero Fellers, MD   Discharge Physician: Adrian Prows MD  Admission Diagnoses: S/P laparoscopic hysterectomy [Z90.710] S/P hysterectomy [Z90.710]  Discharge Diagnoses: same as above  Admission Condition: good  Discharged Condition: good  Indication for Admission: postoperative care  Hospital Course: Patient is admitted postoperatively following her hysterectomy.  She was admitted secondary to lack of available help at home.  Patient had a postoperative course course complicated for weakness of her right leg and right upper extremity.  She was seen for this and evaluated by neurology and physical therapy.  She underwent testing for a stroke but this was negative.  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  She was offered inpatient rehab and outpatient physical therapy.  She declined rehab and physical therapy.  While in the hospital she had an echo which showed grade 2 diastolic dysfunction.  She will be referred to cardiology outpatient for this finding.  Neurology requested follow-up in 2 to 3 months.  Her surgical recovery was otherwise uncomplicated.  She was able to tolerate oral pain medicine and her pain was controlled.  She is able to ambulate and use the bathroom on her own.  She was passing flatus and tolerating regular diet.  She was discharged home with a walker and a bedside commode.  Consults: neurology  Significant Diagnostic Studies: MRI  Treatments: surgery: Laparoscopic Hysterectomy Physical therapy  Discharge Exam: BP 121/78 (BP Location: Right Arm)   Pulse 87   Temp 98.6 F (37 C) (Oral)   Resp 18   SpO2 98%   General Appearance:    Alert, cooperative, no distress, appears stated age  Head:    Normocephalic, without  obvious abnormality, atraumatic  Eyes:    PERRL, conjunctiva/corneas clear, EOM's intact, fundi    benign, both eyes  Ears:    Normal TM's and external ear canals, both ears  Nose:   Nares normal, septum midline, mucosa normal, no drainage    or sinus tenderness  Throat:   Lips, mucosa, and tongue normal; teeth and gums normal  Neck:   Supple, symmetrical, trachea midline, no adenopathy;    thyroid:  no enlargement/tenderness/nodules; no carotid   bruit or JVD  Back:     Symmetric, no curvature, ROM normal, no CVA tenderness  Lungs:     Clear to auscultation bilaterally, respirations unlabored  Chest Wall:    No tenderness or deformity   Heart:    Regular rate and rhythm, S1 and S2 normal, no murmur, rub   or gallop  Breast Exam:    No tenderness, masses, or nipple abnormality  Abdomen:     Soft, non-tender, bowel sounds active all four quadrants,    no masses, no organomegaly  Genitalia:    Normal female without lesion, discharge or tenderness  Rectal:    Normal tone, normal prostate, no masses or tenderness;   guaiac negative stool  Extremities:   Extremities normal, atraumatic, no cyanosis or edema  Pulses:   2+ and symmetric all extremities  Skin:   Skin color, texture, turgor normal, no rashes or lesions  Lymph nodes:   Cervical, supraclavicular, and axillary nodes normal  Neurologic:   CNII-XII intact, normal strength, sensation and reflexes    throughout    Disposition: Discharge disposition: 01-Home or Self Care  Patient Instructions:  Allergies as of 11/18/2019      Reactions   Cherry Hives, Swelling   Fresh cherries with a stem      Medication List    STOP taking these medications   medroxyPROGESTERone 150 MG/ML injection Commonly known as: DEPO-PROVERA   Norethin Ace-Eth Estrad-FE 1-20 MG-MCG(24) Caps     TAKE these medications   acetaminophen 500 MG tablet Commonly known as: TYLENOL Take 2 tablets (1,000 mg total) by mouth every 6 (six) hours for 7  days.   ascorbic acid 500 MG tablet Commonly known as: VITAMIN C Take 500 mg by mouth daily.   B-12 1000 MCG Subl Place 1 tablet under the tongue daily. What changed: how much to take   docusate sodium 100 MG capsule Commonly known as: COLACE Take 1 capsule (100 mg total) by mouth 2 (two) times daily as needed for mild constipation.   Fish Oil 1000 MG Caps Take 1,000 mg by mouth every Sunday.   fluticasone 50 MCG/ACT nasal spray Commonly known as: FLONASE Place 2 sprays into both nostrils daily. What changed:   when to take this  reasons to take this   ibuprofen 600 MG tablet Commonly known as: ADVIL Take 1 tablet (600 mg total) by mouth every 6 (six) hours as needed for cramping. What changed:   when to take this  reasons to take this   loratadine 10 MG tablet Commonly known as: CLARITIN Take 1 tablet (10 mg total) by mouth daily as needed for allergies.   montelukast 10 MG tablet Commonly known as: SINGULAIR Take 1 tablet (10 mg total) by mouth at bedtime. What changed:   when to take this  reasons to take this   multivitamin with minerals Tabs tablet Take 1 tablet by mouth daily.   oxyCODONE 5 MG immediate release tablet Commonly known as: Oxy IR/ROXICODONE Take 1-2 tablets (5-10 mg total) by mouth every 6 (six) hours as needed for severe pain.   polyethylene glycol 17 g packet Commonly known as: MIRALAX / GLYCOLAX Take 17 g by mouth 2 (two) times daily.   simethicone 80 MG chewable tablet Commonly known as: MYLICON Chew 1 tablet (80 mg total) by mouth 4 (four) times daily.   valsartan-hydrochlorothiazide 160-25 MG tablet Commonly known as: DIOVAN-HCT Take 1 tablet by mouth daily. What changed: when to take this   Vitamin D (Ergocalciferol) 1.25 MG (50000 UNIT) Caps capsule Commonly known as: DRISDOL Take 1 capsule (50,000 Units total) by mouth every 7 (seven) days. What changed: when to take this      Activity: activity as  tolerated Diet: regular diet Wound Care: none needed  Follow-up with Dr. Gilman Schmidt in 1 week.  Signed: Homero Fellers 11/18/2019 4:08 PM
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
## 2019-11-18 NOTE — Progress Notes (Signed)
Patient discharged home with daughter. Discharge instructions and prescriptions given and reviewed with patient. Pt to follow up with GYN, Neuro, Cardiology. Patient verbalized understanding. Escorted out by auxillary.

## 2019-11-23 ENCOUNTER — Ambulatory Visit (INDEPENDENT_AMBULATORY_CARE_PROVIDER_SITE_OTHER): Payer: Managed Care, Other (non HMO) | Admitting: Obstetrics and Gynecology

## 2019-11-23 ENCOUNTER — Encounter: Payer: Self-pay | Admitting: Obstetrics and Gynecology

## 2019-11-23 ENCOUNTER — Other Ambulatory Visit: Payer: Self-pay

## 2019-11-23 VITALS — BP 118/72 | Ht 60.0 in | Wt 204.2 lb

## 2019-11-23 DIAGNOSIS — R300 Dysuria: Secondary | ICD-10-CM

## 2019-11-23 DIAGNOSIS — I5082 Biventricular heart failure: Secondary | ICD-10-CM

## 2019-11-23 DIAGNOSIS — Z90710 Acquired absence of both cervix and uterus: Secondary | ICD-10-CM

## 2019-11-23 DIAGNOSIS — F447 Conversion disorder with mixed symptom presentation: Secondary | ICD-10-CM

## 2019-11-23 DIAGNOSIS — I5030 Unspecified diastolic (congestive) heart failure: Secondary | ICD-10-CM

## 2019-11-23 LAB — POCT URINALYSIS DIPSTICK
Bilirubin, UA: NEGATIVE
Blood, UA: POSITIVE
Glucose, UA: NEGATIVE
Ketones, UA: NEGATIVE
Nitrite, UA: NEGATIVE
Protein, UA: NEGATIVE
Spec Grav, UA: 1.025 (ref 1.010–1.025)
Urobilinogen, UA: 0.2 E.U./dL
pH, UA: 5.5 (ref 5.0–8.0)

## 2019-11-23 MED ORDER — NITROFURANTOIN MONOHYD MACRO 100 MG PO CAPS: 100.0000 mg | ORAL_CAPSULE | Freq: Two times a day (BID) | ORAL | 0 refills | Status: AC

## 2019-11-23 NOTE — Progress Notes (Signed)
Postoperative Follow-up Patient presents post op from laparoscopic assisted vaginal hysterectomy for abnormal uterine bleeding, 1 week ago.  Subjective: Patient reports some improvement in her preop symptoms. Eating a regular diet without difficulty. Pain is controlled with current analgesics. Medications being used: acetaminophen and ibuprofen (OTC).  Activity: sedentary. Patient reports additional symptom's since surgery of None.  She reports that she has been slowly improving since the surgery.  She is ambulating with a walker.  She reports that she is doing home PT exercises.  She reports that she is taking Motrin and Tylenol for pain.  She reports that she is also taking stool softener.  She reports that she has had some small bowel movements since the surgery.  She feels difficulty with pushing or straining with bowel movements.  She is out of opiate pain medications.  She reports that her appetite has been minimal.  She tolerates peanut butter and jelly sandwiches yogurt and Jell-O.  She has tried eating a steak and potatoes but that does not have much of an appetite or taste for that.  She reports that her left incision rubs on her close.  She denies any fevers at home.  She does report pain with urination.  Objective: BP 118/72   Ht 5' (1.524 m)   Wt 204 lb 3.2 oz (92.6 kg)   LMP 11/02/2015 Comment: has been irregular  BMI 39.88 kg/m  Physical Exam Constitutional:      Appearance: She is well-developed.  Genitourinary:     Vagina and uterus normal.     No lesions in the vagina.     No cervical motion tenderness.     No right or left adnexal mass present.  HENT:     Head: Normocephalic and atraumatic.  Neck:     Thyroid: No thyromegaly.  Cardiovascular:     Rate and Rhythm: Normal rate and regular rhythm.     Heart sounds: Normal heart sounds.  Pulmonary:     Effort: Pulmonary effort is normal.     Breath sounds: Normal breath sounds.  Chest:     Breasts:        Right: No  inverted nipple, mass, nipple discharge or skin change.        Left: No inverted nipple, mass, nipple discharge or skin change.  Abdominal:     General: Abdomen is flat. Bowel sounds are normal. There is no distension.     Palpations: Abdomen is soft. There is no mass.     Comments: Incisions are clean dry and intact.  No erythema.  Musculoskeletal:     Cervical back: Neck supple.  Neurological:     Mental Status: She is alert and oriented to person, place, and time.  Skin:    General: Skin is warm and dry.  Psychiatric:        Behavior: Behavior normal.        Thought Content: Thought content normal.        Judgment: Judgment normal.  Vitals reviewed.     Assessment: s/p :  total laparoscopic hysterectomy with bilateral salpingectomy stable  Plan: Patient has done well after surgery with no apparent complications.  I have discussed the post-operative course to date, and the expected progress moving forward.  The patient understands what complications to be concerned about.  I will see the patient in routine follow up, or sooner if needed.    Encouraged patient to increase hydration.  Encouraged the patient to continue daily daily stool softener.  Discussed reasons for no further opioid prescriptions.  Encourage patient to continue with physical therapy.  Will check UA.  Referral to cardiology for diastolic heart dysfunction noted on echo in the hospital. Referral to neurology for follow-up. RX sent for UA suggestive of UTI. Urine culture sent.   Activity plan: No heavy lifting.  Pelvic rest.  Benino Korinek R Odella Appelhans 11/23/2019, 9:16 AM

## 2019-11-23 NOTE — Progress Notes (Signed)
Pt is here for Post Op. Pt states she needs some more pain meds.

## 2019-11-28 ENCOUNTER — Encounter: Payer: Self-pay | Admitting: Cardiology

## 2019-11-28 ENCOUNTER — Other Ambulatory Visit: Payer: Self-pay

## 2019-11-28 ENCOUNTER — Ambulatory Visit: Payer: Managed Care, Other (non HMO) | Admitting: Cardiology

## 2019-11-28 VITALS — BP 120/82 | HR 88 | Ht 60.0 in | Wt 201.0 lb

## 2019-11-28 DIAGNOSIS — I10 Essential (primary) hypertension: Secondary | ICD-10-CM | POA: Diagnosis not present

## 2019-11-28 DIAGNOSIS — R9430 Abnormal result of cardiovascular function study, unspecified: Secondary | ICD-10-CM

## 2019-11-28 LAB — URINE CULTURE

## 2019-11-28 NOTE — Patient Instructions (Signed)
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
## 2019-12-21 ENCOUNTER — Encounter: Payer: Self-pay | Admitting: Obstetrics and Gynecology

## 2019-12-21 ENCOUNTER — Other Ambulatory Visit: Payer: Self-pay

## 2019-12-21 ENCOUNTER — Ambulatory Visit (INDEPENDENT_AMBULATORY_CARE_PROVIDER_SITE_OTHER): Payer: Managed Care, Other (non HMO) | Admitting: Obstetrics and Gynecology

## 2019-12-21 VITALS — BP 100/70 | Ht 60.0 in | Wt 202.0 lb

## 2019-12-21 DIAGNOSIS — Z90710 Acquired absence of both cervix and uterus: Secondary | ICD-10-CM

## 2019-12-21 NOTE — Progress Notes (Signed)
  Postoperative Follow-up Patient presents post op from laparoscopic assisted vaginal hysterectomy for abnormal uterine bleeding, 1 month ago.  Subjective: Patient reports some improvement in her preop symptoms. Eating a regular diet without difficulty. Pain is controlled with current analgesics. Medications being used: ibuprofen (OTC).  Activity: normal activities of daily living. Patient reports additional symptom's since surgery of Irregular bleeding.  Objective: BP 100/70   Ht 5' (1.524 m)   Wt 202 lb (91.6 kg)   LMP 11/02/2015 Comment: has been irregular  BMI 39.45 kg/m  Physical Exam Constitutional:      Appearance: She is well-developed.  Genitourinary:     Vagina and uterus normal.     No lesions in the vagina.     No cervical motion tenderness.     No right or left adnexal mass present.     Genitourinary Comments: External: Normal appearing vulva. No lesions noted.  Speculum examination: Normal appearing vaginal cuff. Small simple 1cm cyst in right fornix.  No blood in the vaginal vault. Scant pink discharge.   Bimanual examination: Cuff intact and soft.   HENT:     Head: Normocephalic and atraumatic.  Neck:     Thyroid: No thyromegaly.  Cardiovascular:     Rate and Rhythm: Normal rate and regular rhythm.     Heart sounds: Normal heart sounds.  Pulmonary:     Effort: Pulmonary effort is normal.     Breath sounds: Normal breath sounds.  Chest:     Breasts:        Right: No inverted nipple, mass, nipple discharge or skin change.        Left: No inverted nipple, mass, nipple discharge or skin change.  Abdominal:     General: Abdomen is flat. Bowel sounds are normal. There is no distension.     Palpations: Abdomen is soft. There is no mass.     Comments: Incisions well healed.  right incision has a small amount of induration and slower healing. Intact, not draining. Continue care with daily cleaning and neosporin.   Musculoskeletal:     Cervical back: Neck supple.    Neurological:     Mental Status: She is alert and oriented to person, place, and time.  Skin:    General: Skin is warm and dry.  Psychiatric:        Behavior: Behavior normal.        Thought Content: Thought content normal.        Judgment: Judgment normal.  Vitals reviewed. Exam conducted with a chaperone present.     Assessment: s/p :  laparoscopic assisted vaginal hysterectomy, total laparoscopic hysterectomy with bilateral salpingectomy, cesarean section, tubal ligation, endometrial ablation and LEEP stable  Plan: Patient has done well after surgery with no apparent complications.  I have discussed the post-operative course to date, and the expected progress moving forward.  The patient understands what complications to be concerned about.  I will see the patient in routine follow up, or sooner if needed.    Activity plan: No heavy lifting. Pelvic rest.  Adrian Prows MD, [employer] 12/21/2019 8:41 AM

## 2019-12-21 NOTE — Progress Notes (Signed)
Pt states she's here for her Post Op visit. Pt states she's still bleeding.

## 2020-01-07 ENCOUNTER — Other Ambulatory Visit: Payer: Self-pay | Admitting: Obstetrics and Gynecology

## 2020-01-07 DIAGNOSIS — N921 Excessive and frequent menstruation with irregular cycle: Secondary | ICD-10-CM

## 2020-01-08 ENCOUNTER — Telehealth: Payer: Self-pay | Admitting: Family Medicine

## 2020-01-08 DIAGNOSIS — E559 Vitamin D deficiency, unspecified: Secondary | ICD-10-CM

## 2020-01-08 NOTE — Telephone Encounter (Signed)
Requested medication (s) are due for refill today: no  Requested medication (s) are on the active medication list: yes  Last refill:  10/12/2019  Future visit scheduled:no  Notes to clinic: refill cannot be delegated    Requested Prescriptions  Pending Prescriptions Disp Refills   Vitamin D, Ergocalciferol, (DRISDOL) 1.25 MG (50000 UNIT) CAPS capsule [Pharmacy Med Name: VITAMIN D2 1.25MG (50,000 UNIT)] 12 capsule 1    Sig: Take 1 capsule (50,000 Units total) by mouth every 7 (seven) days.      Endocrinology:  Vitamins - Vitamin D Supplementation Failed - 01/08/2020  1:17 AM      Failed - 50,000 IU strengths are not delegated      Failed - Phosphate in normal range and within 360 days    Phosphorus  Date Value Ref Range Status  07/08/2016 4.2 2.5 - 4.5 mg/dL Final          Failed - Vitamin D in normal range and within 360 days    Vit D, 25-Hydroxy  Date Value Ref Range Status  07/20/2019 25.8 (L) 30.0 - 100.0 ng/mL Final    Comment:    Vitamin D deficiency has been defined by the Institute of Medicine and an Endocrine Society practice guideline as a level of serum 25-OH vitamin D less than 20 ng/mL (1,2). The Endocrine Society went on to further define vitamin D insufficiency as a level between 21 and 29 ng/mL (2). 1. IOM (Institute of Medicine). 2010. Dietary reference    intakes for calcium and D. [place]: The    [employer]. 2. Holick MF, Binkley [place], Bischoff-Ferrari HA, et al.    Evaluation, treatment, and prevention of vitamin D    deficiency: an Endocrine Society clinical practice    guideline. JCEM. 2011 Jul; 96(7):1911-30.           Passed - Ca in normal range and within 360 days    Calcium  Date Value Ref Range Status  07/20/2019 9.7 8.7 - 10.2 mg/dL Final          Passed - Valid encounter within last 12 months    Recent Outpatient Visits           5 months ago Morbid obesity with BMI of 40.0-44.9, adult [facility])   [facility] Steele Sizer, MD   1 year ago Well woman exam with routine gynecological exam   [facility] Steele Sizer, MD   1 year ago Hypertension, benign   [facility] Steele Sizer, MD   1 year ago Morbid obesity [facility])   [facility] Steele Sizer, MD   2 years ago Well woman exam with routine gynecological exam   [facility] Steele Sizer, MD

## 2020-01-09 ENCOUNTER — Other Ambulatory Visit: Payer: Self-pay

## 2020-01-14 ENCOUNTER — Other Ambulatory Visit: Payer: Self-pay | Admitting: Family Medicine

## 2020-01-14 DIAGNOSIS — I10 Essential (primary) hypertension: Secondary | ICD-10-CM

## 2020-01-18 ENCOUNTER — Other Ambulatory Visit: Payer: Self-pay

## 2020-01-18 ENCOUNTER — Encounter: Payer: Self-pay | Admitting: Obstetrics and Gynecology

## 2020-01-18 ENCOUNTER — Ambulatory Visit (INDEPENDENT_AMBULATORY_CARE_PROVIDER_SITE_OTHER): Payer: Managed Care, Other (non HMO) | Admitting: Obstetrics and Gynecology

## 2020-01-18 VITALS — BP 128/72 | Ht 60.0 in | Wt 202.0 lb

## 2020-01-18 DIAGNOSIS — Z90710 Acquired absence of both cervix and uterus: Secondary | ICD-10-CM

## 2020-01-18 DIAGNOSIS — N760 Acute vaginitis: Secondary | ICD-10-CM

## 2020-01-18 DIAGNOSIS — R300 Dysuria: Secondary | ICD-10-CM | POA: Diagnosis not present

## 2020-01-18 LAB — POCT URINALYSIS DIPSTICK
Bilirubin, UA: NEGATIVE
Blood, UA: NEGATIVE
Glucose, UA: NEGATIVE
Ketones, UA: NEGATIVE
Leukocytes, UA: NEGATIVE
Nitrite, UA: NEGATIVE
Protein, UA: NEGATIVE
Spec Grav, UA: 1.02 (ref 1.010–1.025)
Urobilinogen, UA: 0.2 E.U./dL
pH, UA: 5.5 (ref 5.0–8.0)

## 2020-01-18 NOTE — Patient Instructions (Signed)

## 2020-01-18 NOTE — Addendum Note (Signed)
Addended by: Vernia Buff A on: 01/18/2020 10:14 AM   Modules accepted: Orders

## 2020-01-18 NOTE — Progress Notes (Signed)
  Postoperative Follow-up Patient presents post op from laparoscopic assisted vaginal hysterectomy for abnormal uterine bleeding, 8 weeks ago.  Subjective: Patient reports some improvement in her preop symptoms. Eating a regular diet without difficulty. Pain is controlled with current analgesics. Medications being used: ibuprofen (OTC).  Activity: normal activities of daily living. Patient reports additional symptom's since surgery of constipation and pain with urination. Also thin malodorous discharge.  Objective: BP 128/72   Ht 5' (1.524 m)   Wt 202 lb (91.6 kg)   LMP 11/02/2015 Comment: has been irregular  BMI 39.45 kg/m  Physical Exam Constitutional:      Appearance: She is well-developed.  Genitourinary:     Vagina and uterus normal.     No lesions in the vagina.     No cervical motion tenderness.     No right or left adnexal mass present.     Genitourinary Comments: Speculum exam: vaginal cuff normal. No blood. Thin white discharge Bimanual exam. Cuff intact, no pain  HENT:     Head: Normocephalic and atraumatic.  Neck:     Thyroid: No thyromegaly.  Cardiovascular:     Rate and Rhythm: Normal rate and regular rhythm.     Heart sounds: Normal heart sounds.  Pulmonary:     Effort: Pulmonary effort is normal.     Breath sounds: Normal breath sounds.  Chest:     Breasts:        Right: No inverted nipple, mass, nipple discharge or skin change.        Left: No inverted nipple, mass, nipple discharge or skin change.  Abdominal:     General: Abdomen is flat. Bowel sounds are normal. There is no distension.     Palpations: Abdomen is soft. There is no mass.     Comments: Incisions well healed  Musculoskeletal:     Cervical back: Neck supple.  Neurological:     Mental Status: She is alert and oriented to person, place, and time.  Skin:    General: Skin is warm and dry.  Psychiatric:        Behavior: Behavior normal.        Thought Content: Thought content normal.         Judgment: Judgment normal.  Vitals reviewed.     Assessment: s/p :  total laparoscopic hysterectomy with bilateral salpingectomy stable  Plan: Patient has done well after surgery with no apparent complications.  I have discussed the post-operative course to date, and the expected progress moving forward.  The patient understands what complications to be concerned about.  I will see the patient in routine follow up, or sooner if needed.    Will check urine culture today Nuswab for acute vaginitis  Activity plan: No heavy lifting. Pelvic rest for 4 more weels.  Ludwig Tugwell R Caliana Spires 01/18/2020, 9:55 AM

## 2020-01-18 NOTE — Addendum Note (Signed)
Addended by: Vernia Buff A on: 01/18/2020 10:25 AM   Modules accepted: Orders

## 2020-01-20 LAB — URINE CULTURE: Organism ID, Bacteria: NO GROWTH

## 2020-01-21 LAB — NUSWAB BV AND CANDIDA, NAA
Atopobium vaginae: HIGH Score — AB
BVAB 2: HIGH Score — AB
Candida albicans, NAA: NEGATIVE
Candida glabrata, NAA: NEGATIVE
Megasphaera 1: HIGH Score — AB

## 2020-02-16 ENCOUNTER — Ambulatory Visit: Payer: Managed Care, Other (non HMO) | Admitting: Neurology

## 2020-02-16 ENCOUNTER — Ambulatory Visit: Payer: Managed Care, Other (non HMO) | Admitting: Obstetrics and Gynecology

## 2020-02-21 ENCOUNTER — Other Ambulatory Visit: Payer: Self-pay

## 2020-02-21 ENCOUNTER — Encounter: Payer: Self-pay | Admitting: Obstetrics and Gynecology

## 2020-02-21 ENCOUNTER — Ambulatory Visit (INDEPENDENT_AMBULATORY_CARE_PROVIDER_SITE_OTHER): Payer: Managed Care, Other (non HMO) | Admitting: Obstetrics and Gynecology

## 2020-02-21 VITALS — BP 116/72 | Ht 60.0 in | Wt 198.0 lb

## 2020-02-21 DIAGNOSIS — R300 Dysuria: Secondary | ICD-10-CM | POA: Diagnosis not present

## 2020-02-21 DIAGNOSIS — R102 Pelvic and perineal pain: Secondary | ICD-10-CM

## 2020-02-21 DIAGNOSIS — N760 Acute vaginitis: Secondary | ICD-10-CM

## 2020-02-21 DIAGNOSIS — B9689 Other specified bacterial agents as the cause of diseases classified elsewhere: Secondary | ICD-10-CM

## 2020-02-21 LAB — POCT URINALYSIS DIPSTICK
Blood, UA: NEGATIVE
Glucose, UA: NEGATIVE
Ketones, UA: NEGATIVE
Leukocytes, UA: NEGATIVE
Nitrite, UA: NEGATIVE
Protein, UA: NEGATIVE
Spec Grav, UA: >=1.03 — AB (ref 1.010–1.025)
Urobilinogen, UA: 0.2 E.U./dL
pH, UA: 5 (ref 5.0–8.0)

## 2020-02-21 MED ORDER — METRONIDAZOLE 500 MG PO TABS: 500.0000 mg | ORAL_TABLET | Freq: Two times a day (BID) | ORAL | 0 refills | Status: AC

## 2020-02-21 NOTE — Progress Notes (Signed)
Follow up visit

## 2020-02-21 NOTE — Patient Instructions (Signed)

## 2020-02-21 NOTE — Progress Notes (Signed)
  Postoperative Follow-up Patient presents post op from laparoscopic assisted vaginal hysterectomy  for abnormal uterine bleeding, 3 months ago.  Subjective: Patient reports some improvement in her preop symptoms. Eating a regular diet without difficulty. Pain is controlled without any medications.  Activity: normal activities of daily living. Patient reports additional symptom's since surgery of constipation, straining with bowel movement, hemorrhoids, midline pain and bladder pressure.   Objective: BP 116/72   Ht 5' (1.524 m)   Wt 198 lb (89.8 kg)   LMP 11/02/2015 Comment: has been irregular  BMI 38.67 kg/m  Physical Exam Constitutional:      Appearance: She is well-developed.  Genitourinary:     Vagina and uterus normal.     There is no lesion on the right labia.     There is no lesion on the left labia.    No lesions in the vagina.     Genitourinary Comments: Vaginal cuff is well healed, thin watery vaginal discharge Bimanual exam: Cuff intact, patient tender to palpation especially on right.       Right Adnexa: no mass present.    Left Adnexa: no mass present.    No cervical motion tenderness.  Breasts:     Right: No inverted nipple, mass, nipple discharge or skin change.     Left: No inverted nipple, mass, nipple discharge or skin change.    HENT:     Head: Normocephalic and atraumatic.  Eyes:     Extraocular Movements: EOM normal.  Neck:     Thyroid: No thyromegaly.  Cardiovascular:     Rate and Rhythm: Normal rate and regular rhythm.     Heart sounds: Normal heart sounds.  Pulmonary:     Effort: Pulmonary effort is normal.     Breath sounds: Normal breath sounds.  Abdominal:     General: Bowel sounds are normal. There is no distension.     Palpations: Abdomen is soft. There is no mass.     Comments: Incisions are well healed  Musculoskeletal:     Cervical back: Neck supple.  Neurological:     Mental Status: She is alert and oriented to person, place, and  time.  Skin:    General: Skin is warm and dry.  Psychiatric:        Mood and Affect: Mood and affect normal.        Behavior: Behavior normal.        Thought Content: Thought content normal.        Judgment: Judgment normal.  Vitals reviewed. Exam conducted with a chaperone present.     Assessment: s/p :  laparoscopic assisted vaginal hysterectomy  stable  Plan: Patient has done well after surgery with no apparent complications.  I have discussed the post-operative course to date, and the expected progress moving forward.  The patient understands what complications to be concerned about.  I will see the patient in routine follow up, or sooner if needed.    Activity plan: No restriction.  Will treat for bacterial vaginosis Will return for pelvic US Given information regarding constipation management  Yexalen Deike R Daphyne Miguez 02/21/2020, 9:38 AM

## 2020-02-29 MED ORDER — VITAMIN D (ERGOCALCIFEROL) 1.25 MG (50000 UNIT) PO CAPS: 50000.0000 [IU] | ORAL_CAPSULE | ORAL | 1 refills | Status: DC

## 2020-02-29 NOTE — Telephone Encounter (Signed)
Pt has her CPE scheduled for June and she is asking if you can call in this medication until she comes in for her appt

## 2020-02-29 NOTE — Addendum Note (Signed)
Addended by: Steele Sizer F on: 02/29/2020 12:24 PM   Modules accepted: Orders

## 2020-03-09 ENCOUNTER — Encounter: Payer: Self-pay | Admitting: Obstetrics and Gynecology

## 2020-03-09 ENCOUNTER — Other Ambulatory Visit: Payer: Self-pay

## 2020-03-09 ENCOUNTER — Ambulatory Visit (INDEPENDENT_AMBULATORY_CARE_PROVIDER_SITE_OTHER): Payer: Managed Care, Other (non HMO) | Admitting: Obstetrics and Gynecology

## 2020-03-09 ENCOUNTER — Ambulatory Visit (INDEPENDENT_AMBULATORY_CARE_PROVIDER_SITE_OTHER): Payer: Managed Care, Other (non HMO)

## 2020-03-09 VITALS — BP 113/72 | Ht 60.0 in | Wt 197.0 lb

## 2020-03-09 DIAGNOSIS — Z90710 Acquired absence of both cervix and uterus: Secondary | ICD-10-CM | POA: Diagnosis not present

## 2020-03-09 DIAGNOSIS — R102 Pelvic and perineal pain: Secondary | ICD-10-CM

## 2020-03-09 DIAGNOSIS — R300 Dysuria: Secondary | ICD-10-CM | POA: Diagnosis not present
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
## 2020-04-15 ENCOUNTER — Other Ambulatory Visit: Payer: Self-pay | Admitting: Family Medicine

## 2020-04-15 DIAGNOSIS — I10 Essential (primary) hypertension: Secondary | ICD-10-CM

## 2020-04-15 NOTE — Telephone Encounter (Signed)
Courtesy refill. Requested Prescriptions  Pending Prescriptions Disp Refills  . valsartan-hydrochlorothiazide (DIOVAN-HCT) 160-25 MG tablet [Pharmacy Med Name: VALSARTAN-HCTZ 160-25 MG TAB] 90 tablet 0    Sig: TAKE 1 TABLET BY MOUTH EVERY DAY     Cardiovascular: ARB + Diuretic Combos Failed - 04/15/2020 12:51 AM      Failed - K in normal range and within 180 days    Potassium  Date Value Ref Range Status  11/09/2019 3.3 (L) 3.5 - 5.1 mmol/L Final    Comment:    Performed at [facility], [address] [place], [place] [address]         Failed - Na in normal range and within 180 days    Sodium  Date Value Ref Range Status  07/20/2019 138 134 - 144 mmol/L Final         Failed - Cr in normal range and within 180 days    Creatinine, Ser  Date Value Ref Range Status  07/20/2019 0.86 0.57 - 1.00 mg/dL Final         Failed - Ca in normal range and within 180 days    Calcium  Date Value Ref Range Status  07/20/2019 9.7 8.7 - 10.2 mg/dL Final         Failed - Valid encounter within last 6 months    Recent Outpatient Visits          9 months ago Morbid obesity with BMI of 40.0-44.9, adult [facility])   [facility] Steele Sizer, MD   1 year ago Well woman exam with routine gynecological exam   [facility] Steele Sizer, MD   1 year ago Hypertension, benign   [facility] Steele Sizer, MD   2 years ago Morbid obesity [facility])   [facility] Steele Sizer, MD   2 years ago Well woman exam with routine gynecological exam   [facility] Steele Sizer, MD      Future Appointments            In 3 months Steele Sizer, MD [facility], [place] - Patient is not pregnant      Passed - Last BP in normal range    BP Readings from Last 1 Encounters:  03/09/20 113/72

## 2020-07-12 ENCOUNTER — Other Ambulatory Visit: Payer: Self-pay | Admitting: Family Medicine

## 2020-07-12 DIAGNOSIS — I10 Essential (primary) hypertension: Secondary | ICD-10-CM

## 2020-07-12 NOTE — Telephone Encounter (Signed)
Courtesy refill.  Requested Prescriptions  Pending Prescriptions Disp Refills  . valsartan-hydrochlorothiazide (DIOVAN-HCT) 160-25 MG tablet [Pharmacy Med Name: VALSARTAN-HCTZ 160-25 MG TAB] 30 tablet 0    Sig: TAKE 1 TABLET BY MOUTH EVERY DAY     Cardiovascular: ARB + Diuretic Combos Failed - 07/12/2020  4:02 AM      Failed - K in normal range and within 180 days    Potassium  Date Value Ref Range Status  11/09/2019 3.3 (L) 3.5 - 5.1 mmol/L Final    Comment:    Performed at [facility], [address] [place], [place] [address]         Failed - Na in normal range and within 180 days    Sodium  Date Value Ref Range Status  07/20/2019 138 134 - 144 mmol/L Final         Failed - Cr in normal range and within 180 days    Creatinine, Ser  Date Value Ref Range Status  07/20/2019 0.86 0.57 - 1.00 mg/dL Final         Failed - Ca in normal range and within 180 days    Calcium  Date Value Ref Range Status  07/20/2019 9.7 8.7 - 10.2 mg/dL Final         Failed - Valid encounter within last 6 months    Recent Outpatient Visits          12 months ago Morbid obesity with BMI of 40.0-44.9, adult [facility])   [facility] Steele Sizer, MD   2 years ago Well woman exam with routine gynecological exam   [facility] Steele Sizer, MD   2 years ago Hypertension, benign   [facility] Steele Sizer, MD   2 years ago Morbid obesity [facility])   [facility] Steele Sizer, MD   3 years ago Well woman exam with routine gynecological exam   [facility] Steele Sizer, MD      Future Appointments            In 4 weeks Steele Sizer, MD [facility], [place] - Patient is not pregnant      Passed - Last BP in normal range    BP Readings from Last 1 Encounters:  03/09/20 113/72

## 2020-07-23 ENCOUNTER — Emergency Department: Payer: Managed Care, Other (non HMO)

## 2020-07-23 ENCOUNTER — Observation Stay: Payer: Managed Care, Other (non HMO)

## 2020-07-23 ENCOUNTER — Other Ambulatory Visit: Payer: Self-pay

## 2020-07-23 ENCOUNTER — Encounter: Payer: Self-pay | Admitting: Emergency Medicine

## 2020-07-23 ENCOUNTER — Inpatient Hospital Stay
Admission: EM | Admit: 2020-07-23 | Discharge: 2020-07-28 | DRG: 337 | Disposition: A | Discharge status: Home / Self Care | Payer: Managed Care, Other (non HMO) | Attending: Internal Medicine | Admitting: Internal Medicine

## 2020-07-23 DIAGNOSIS — N736 Female pelvic peritoneal adhesions (postinfective): Secondary | ICD-10-CM | POA: Diagnosis present

## 2020-07-23 DIAGNOSIS — A084 Viral intestinal infection, unspecified: Secondary | ICD-10-CM | POA: Diagnosis not present

## 2020-07-23 DIAGNOSIS — I10 Essential (primary) hypertension: Secondary | ICD-10-CM | POA: Diagnosis not present

## 2020-07-23 DIAGNOSIS — Z20822 Contact with and (suspected) exposure to covid-19: Secondary | ICD-10-CM | POA: Diagnosis present

## 2020-07-23 DIAGNOSIS — R112 Nausea with vomiting, unspecified: Secondary | ICD-10-CM | POA: Diagnosis present

## 2020-07-23 DIAGNOSIS — Z8249 Family history of ischemic heart disease and other diseases of the circulatory system: Secondary | ICD-10-CM

## 2020-07-23 DIAGNOSIS — Z885 Allergy status to narcotic agent status: Secondary | ICD-10-CM

## 2020-07-23 DIAGNOSIS — N83209 Unspecified ovarian cyst, unspecified side: Secondary | ICD-10-CM | POA: Diagnosis not present

## 2020-07-23 DIAGNOSIS — Z833 Family history of diabetes mellitus: Secondary | ICD-10-CM

## 2020-07-23 DIAGNOSIS — F32A Depression, unspecified: Secondary | ICD-10-CM | POA: Diagnosis present

## 2020-07-23 DIAGNOSIS — E860 Dehydration: Secondary | ICD-10-CM | POA: Diagnosis not present

## 2020-07-23 DIAGNOSIS — A419 Sepsis, unspecified organism: Secondary | ICD-10-CM | POA: Diagnosis present

## 2020-07-23 DIAGNOSIS — Z79899 Other long term (current) drug therapy: Secondary | ICD-10-CM

## 2020-07-23 DIAGNOSIS — Z6839 Body mass index (BMI) 39.0-39.9, adult: Secondary | ICD-10-CM

## 2020-07-23 DIAGNOSIS — R000 Tachycardia, unspecified: Secondary | ICD-10-CM | POA: Diagnosis not present

## 2020-07-23 DIAGNOSIS — R1031 Right lower quadrant pain: Secondary | ICD-10-CM

## 2020-07-23 DIAGNOSIS — E559 Vitamin D deficiency, unspecified: Secondary | ICD-10-CM | POA: Diagnosis present

## 2020-07-23 DIAGNOSIS — F419 Anxiety disorder, unspecified: Secondary | ICD-10-CM | POA: Diagnosis present

## 2020-07-23 LAB — CBC
HCT: 38 % (ref 36.0–46.0)
Hemoglobin: 12.9 g/dL (ref 12.0–15.0)
MCH: 29.9 pg (ref 26.0–34.0)
MCHC: 33.9 g/dL (ref 30.0–36.0)
MCV: 88 fL (ref 80.0–100.0)
Platelets: 330 10*3/uL (ref 150–400)
RBC: 4.32 MIL/uL (ref 3.87–5.11)
RDW: 12.1 % (ref 11.5–15.5)
WBC: 9.2 10*3/uL (ref 4.0–10.5)
nRBC: 0 % (ref 0.0–0.2)

## 2020-07-23 LAB — MAGNESIUM: Magnesium: 1.4 mg/dL — ABNORMAL LOW (ref 1.7–2.4)

## 2020-07-23 LAB — CBC WITH DIFFERENTIAL/PLATELET
Abs Immature Granulocytes: 0.04 10*3/uL (ref 0.00–0.07)
Basophils Absolute: 0 10*3/uL (ref 0.0–0.1)
Basophils Relative: 0 %
Eosinophils Absolute: 0.1 10*3/uL (ref 0.0–0.5)
Eosinophils Relative: 1 %
HCT: 38.3 % (ref 36.0–46.0)
Hemoglobin: 12.5 g/dL (ref 12.0–15.0)
Immature Granulocytes: 0 %
Lymphocytes Relative: 7 %
Lymphs Abs: 0.6 10*3/uL — ABNORMAL LOW (ref 0.7–4.0)
MCH: 29.7 pg (ref 26.0–34.0)
MCHC: 32.6 g/dL (ref 30.0–36.0)
MCV: 91 fL (ref 80.0–100.0)
Monocytes Absolute: 0.5 10*3/uL (ref 0.1–1.0)
Monocytes Relative: 5 %
Neutro Abs: 8.1 10*3/uL — ABNORMAL HIGH (ref 1.7–7.7)
Neutrophils Relative %: 87 %
Platelets: 347 10*3/uL (ref 150–400)
RBC: 4.21 MIL/uL (ref 3.87–5.11)
RDW: 12.5 % (ref 11.5–15.5)
WBC: 9.4 10*3/uL (ref 4.0–10.5)
nRBC: 0 % (ref 0.0–0.2)

## 2020-07-23 LAB — TSH: TSH: 0.699 u[IU]/mL (ref 0.350–4.500)

## 2020-07-23 LAB — URINALYSIS, COMPLETE (UACMP) WITH MICROSCOPIC
Bacteria, UA: NONE SEEN
Bilirubin Urine: NEGATIVE
Glucose, UA: NEGATIVE mg/dL
Hgb urine dipstick: NEGATIVE
Ketones, ur: NEGATIVE mg/dL
Leukocytes,Ua: NEGATIVE
Nitrite: NEGATIVE
Protein, ur: NEGATIVE mg/dL
Specific Gravity, Urine: 1.027 (ref 1.005–1.030)
pH: 6 (ref 5.0–8.0)

## 2020-07-23 LAB — COMPREHENSIVE METABOLIC PANEL
ALT: 36 U/L (ref 0–44)
AST: 28 U/L (ref 15–41)
Albumin: 4.1 g/dL (ref 3.5–5.0)
Alkaline Phosphatase: 64 U/L (ref 38–126)
Anion gap: 8 (ref 5–15)
BUN: 15 mg/dL (ref 6–20)
CO2: 25 mmol/L (ref 22–32)
Calcium: 9 mg/dL (ref 8.9–10.3)
Chloride: 102 mmol/L (ref 98–111)
Creatinine, Ser: 0.63 mg/dL (ref 0.44–1.00)
GFR, Estimated: >60 mL/min (ref >60)
Glucose, Bld: 111 mg/dL — ABNORMAL HIGH (ref 70–99)
Potassium: 3.9 mmol/L (ref 3.5–5.1)
Sodium: 135 mmol/L (ref 135–145)
Total Bilirubin: 0.8 mg/dL (ref 0.3–1.2)
Total Protein: 8 g/dL (ref 6.5–8.1)

## 2020-07-23 LAB — PREGNANCY, URINE: Preg Test, Ur: NEGATIVE

## 2020-07-23 LAB — URINE DRUG SCREEN, QUALITATIVE (ARMC ONLY)
Amphetamines, Ur Screen: NOT DETECTED
Barbiturates, Ur Screen: NOT DETECTED
Benzodiazepine, Ur Scrn: NOT DETECTED
Cannabinoid 50 Ng, Ur ~~LOC~~: NOT DETECTED
Cocaine Metabolite,Ur ~~LOC~~: NOT DETECTED
MDMA (Ecstasy)Ur Screen: NOT DETECTED
Methadone Scn, Ur: NOT DETECTED
Opiate, Ur Screen: NOT DETECTED
Phencyclidine (PCP) Ur S: NOT DETECTED
Tricyclic, Ur Screen: NOT DETECTED

## 2020-07-23 LAB — PHOSPHORUS: Phosphorus: 3.2 mg/dL (ref 2.5–4.6)

## 2020-07-23 LAB — CK: Total CK: 53 U/L (ref 38–234)

## 2020-07-23 LAB — LACTIC ACID, PLASMA: Lactic Acid, Venous: 1.1 mmol/L (ref 0.5–1.9)

## 2020-07-23 LAB — RESP PANEL BY RT-PCR (FLU A&B, COVID) ARPGX2
Influenza A by PCR: NEGATIVE
Influenza B by PCR: NEGATIVE
SARS Coronavirus 2 by RT PCR: NEGATIVE

## 2020-07-23 LAB — TROPONIN I (HIGH SENSITIVITY): Troponin I (High Sensitivity): 3 ng/L (ref <18)

## 2020-07-23 LAB — T4, FREE: Free T4: 0.93 ng/dL (ref 0.61–1.12)

## 2020-07-23 LAB — LIPASE, BLOOD: Lipase: 30 U/L (ref 11–51)

## 2020-07-23 LAB — D-DIMER, QUANTITATIVE: D-Dimer, Quant: 0.66 ug/mL-FEU — ABNORMAL HIGH (ref 0.00–0.50)

## 2020-07-23 LAB — PROCALCITONIN: Procalcitonin: <0.1 ng/mL

## 2020-07-23 IMAGING — CT CT ABD-PELV W/ CM
2 of 4 series · 16 of 46 positions shown, 18 images · IV contrast (APPLIED)
Comparison: None.

CLINICAL DATA: Right lower quadrant abdominal pain.  Vomiting.

EXAM:
CT ABDOMEN AND PELVIS WITH CONTRAST
TECHNIQUE: Multidetector CT imaging of the abdomen and pelvis was performed
using the standard protocol following bolus administration of
intravenous contrast.
CONTRAST:  100mL OMNIPAQUE IOHEXOL 300 MG/ML  SOLN

[Series 2: routine abd/pel with · axial · 0.80mm/px · z∈[-476,-16]mm · 13 of 102 slices shown, 15 images]
[im 5/102  soft-tissue]
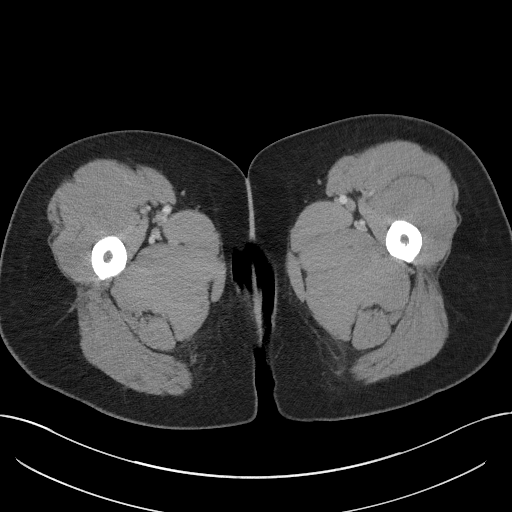
[im 5/102  bone]
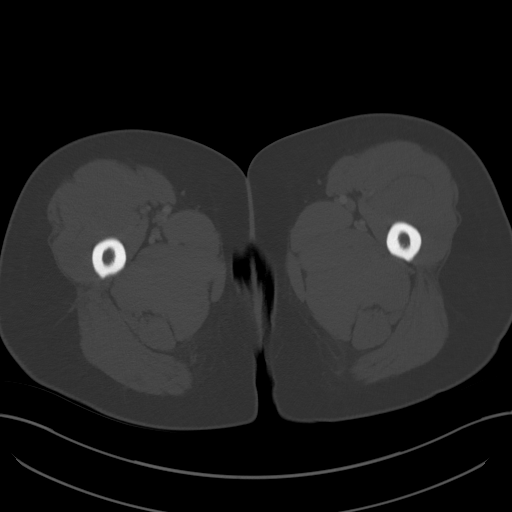
[im 14/102  soft-tissue]
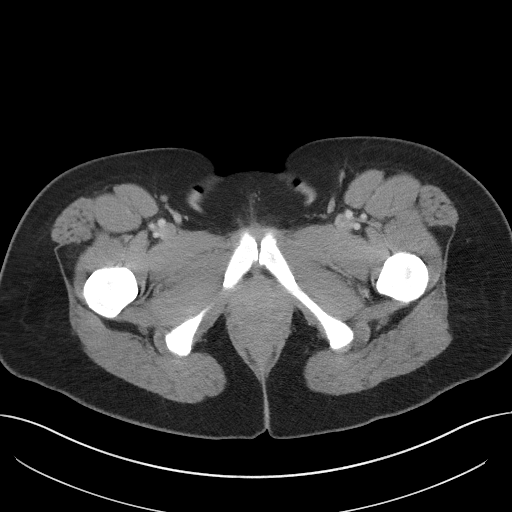
[im 22/102  soft-tissue]
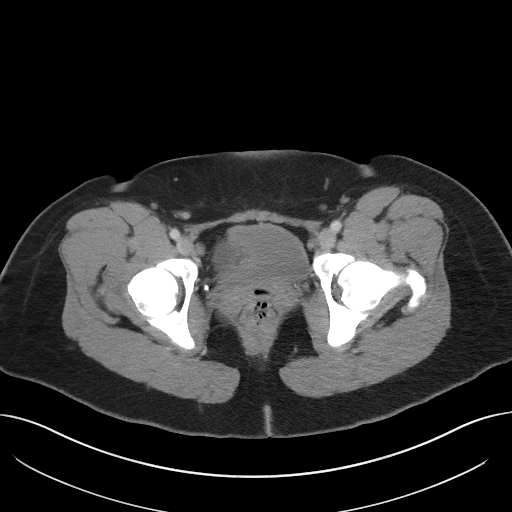
[im 27/102  soft-tissue]
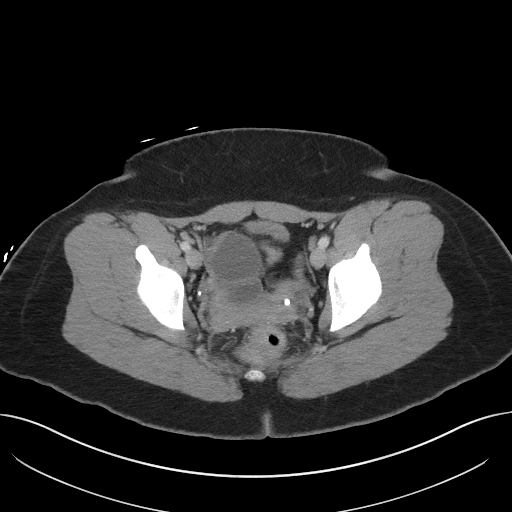
[im 36/102  soft-tissue]
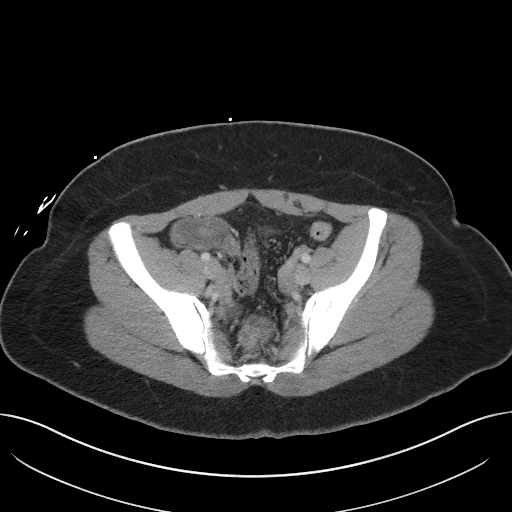
[im 44/102  soft-tissue]
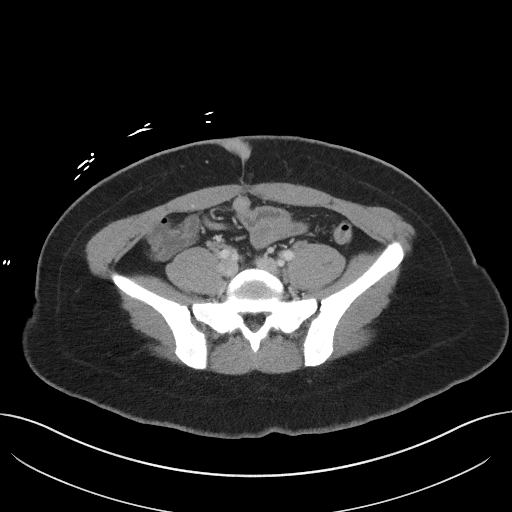
[im 53/102  soft-tissue]
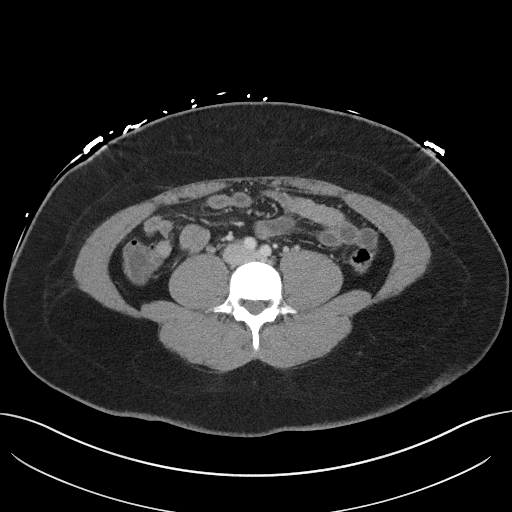
[im 58/102  soft-tissue]
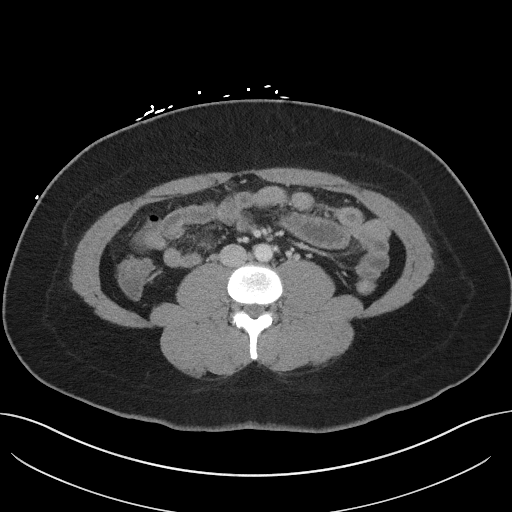
[im 66/102  soft-tissue]
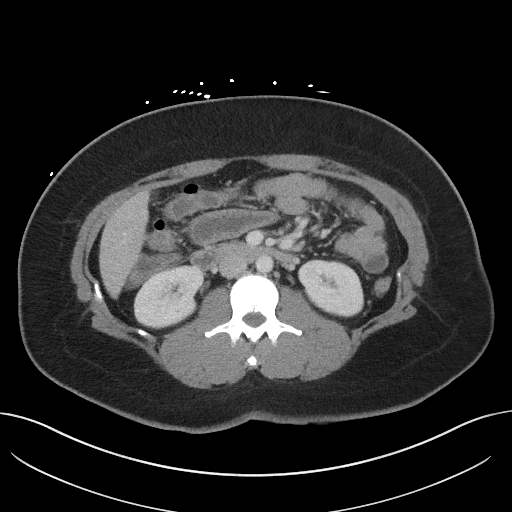
[im 66/102  bone]
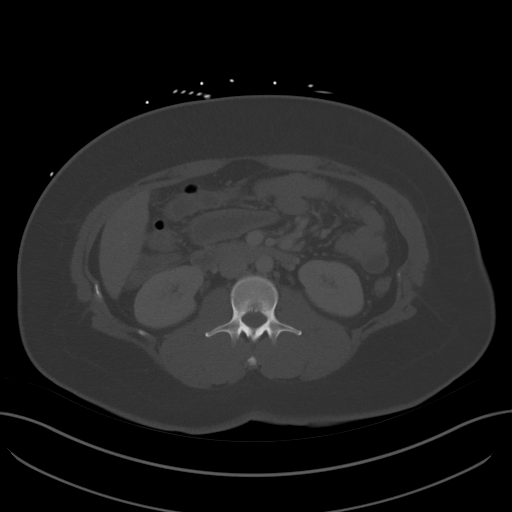
[im 75/102  soft-tissue]
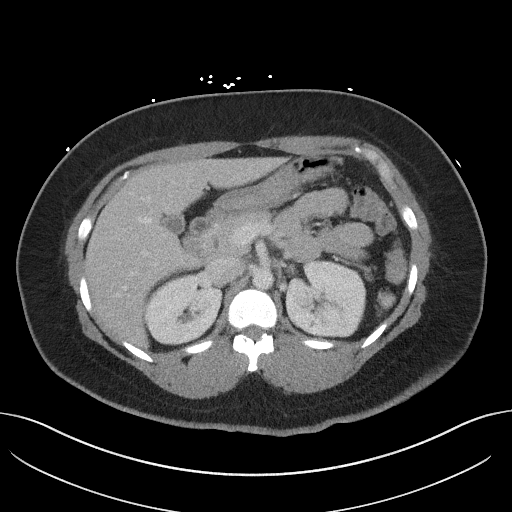
[im 80/102  soft-tissue]
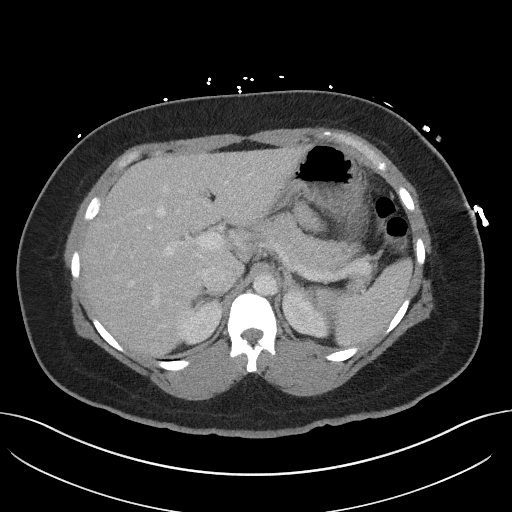
[im 88/102  soft-tissue]
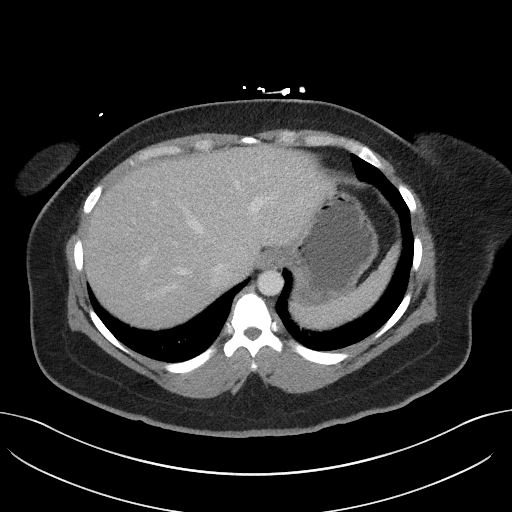
[im 97/102  soft-tissue]
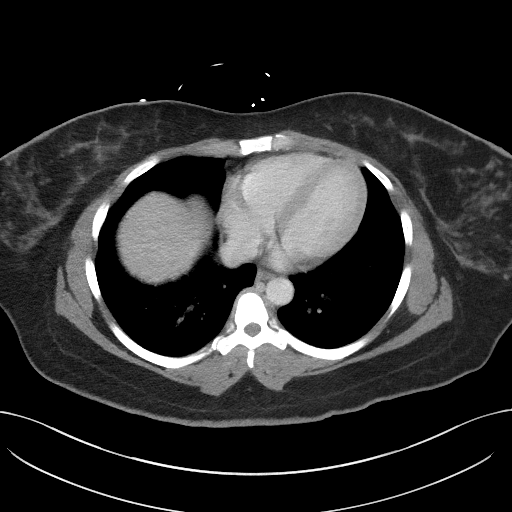

[Series 5: coronal st · coronal · 0.95mm/px · 3 of 91 slices shown]
[im 31/91  soft-tissue]
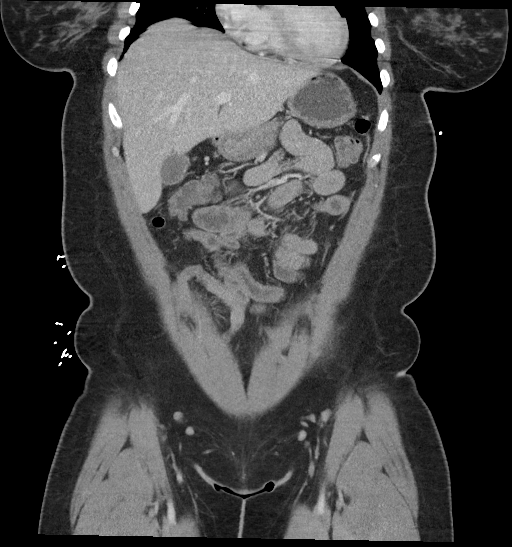
[im 41/91  soft-tissue]
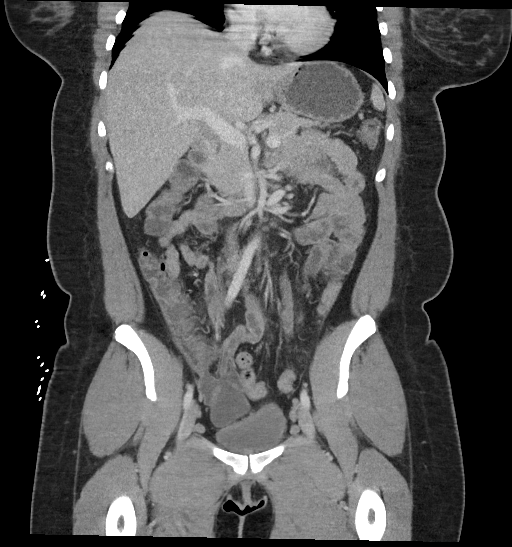
[im 51/91  soft-tissue]
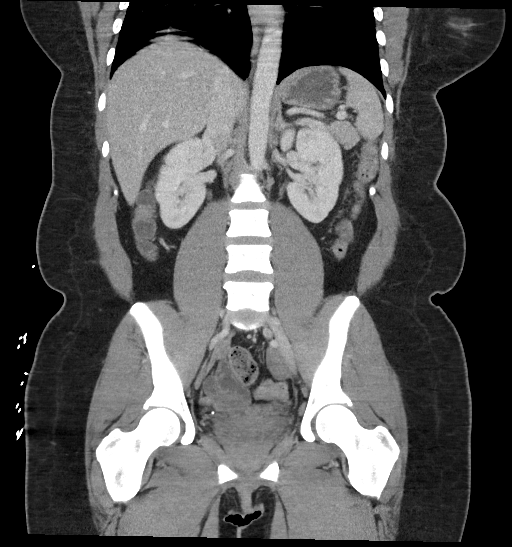

[16 of 46 positions shown; findings below may reference images not displayed]

FINDINGS: Lower chest: Unremarkable.

Hepatobiliary: No suspicious focal abnormality within the liver
parenchyma. There is no evidence for gallstones, gallbladder wall
thickening, or pericholecystic fluid. No intrahepatic or
extrahepatic biliary dilation.

Pancreas: No focal mass lesion. No dilatation of the main duct. No
intraparenchymal cyst. No peripancreatic edema.

Spleen: No splenomegaly. No focal mass lesion.

Adrenals/Urinary Tract: No adrenal nodule or mass. Right kidney
unremarkable. 9 x 5 mm nonobstructing stone identified interpolar
left kidney with an additional 4 mm stone in the upper pole. Tiny
hypodensity lower pole left kidney is too small to characterize but
likely a benign cyst. No evidence for hydroureter. The urinary
bladder appears normal for the degree of distention.

Stomach/Bowel: Stomach is unremarkable. No gastric wall thickening.
No evidence of outlet obstruction. Duodenum is normally positioned
as is the ligament of Treitz. No small bowel wall thickening. No
small bowel dilatation. The terminal ileum is normal. The appendix
is normal. No gross colonic mass. No colonic wall thickening.

Vascular/Lymphatic: No abdominal aortic aneurysm. No abdominal
lymphadenopathy No pelvic sidewall lymphadenopathy.

Reproductive: Uterus surgically absent. 3.8 x 3.2 cm complex cystic
lesion identified posterior right adnexal space.

Other: No intraperitoneal free fluid.

Musculoskeletal: No worrisome lytic or sclerotic osseous
abnormality.
IMPRESSION: 1. 3.8 x 3.2 cm complex cystic lesion posterior right adnexal space.
Given the history of right lower quadrant pain, pelvic ultrasound
recommended to further evaluate.
2. Nonobstructing left renal stones.

## 2020-07-23 IMAGING — US US PELVIS COMPLETE TRANSABD/TRANSVAG W DUPLEX
2 series · 13 of 25 positions shown · non-contrast
Comparison: CT of the pelvis dated [DATE].

CLINICAL DATA: 39-year-old female with lower abdominal pain.

EXAM:
TRANSABDOMINAL AND TRANSVAGINAL ULTRASOUND OF PELVIS
DOPPLER ULTRASOUND OF OVARIES
TECHNIQUE: Both transabdominal and transvaginal ultrasound examinations of the
pelvis were performed. Transabdominal technique was performed for
global imaging of the pelvis including uterus, ovaries, adnexal
regions, and pelvic cul-de-sac.
It was necessary to proceed with endovaginal exam following the
transabdominal exam to visualize the ovaries. Color and duplex
Doppler ultrasound was utilized to evaluate blood flow to the
ovaries.

[Series 1: us pelvis complete transabd/transvag w duplex · 0.24mm/px · 91 acquisitions, 12 frames shown]
[im 1/91]
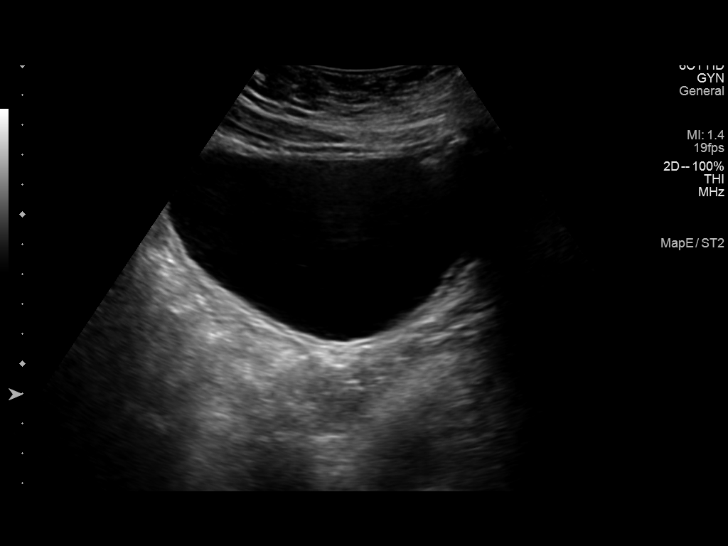
[im 8/91]
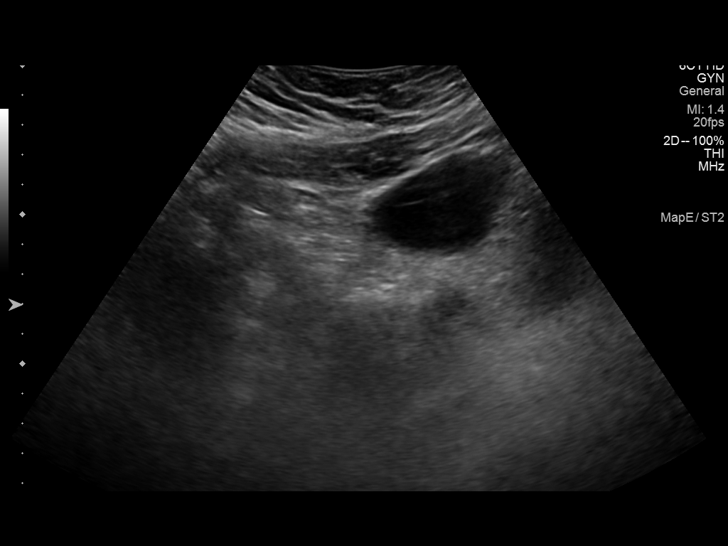
[im 16/91]
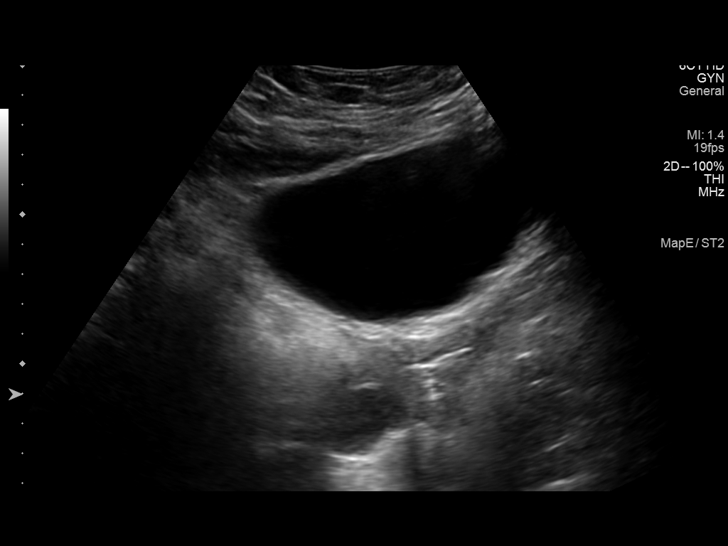
[im 24/91]
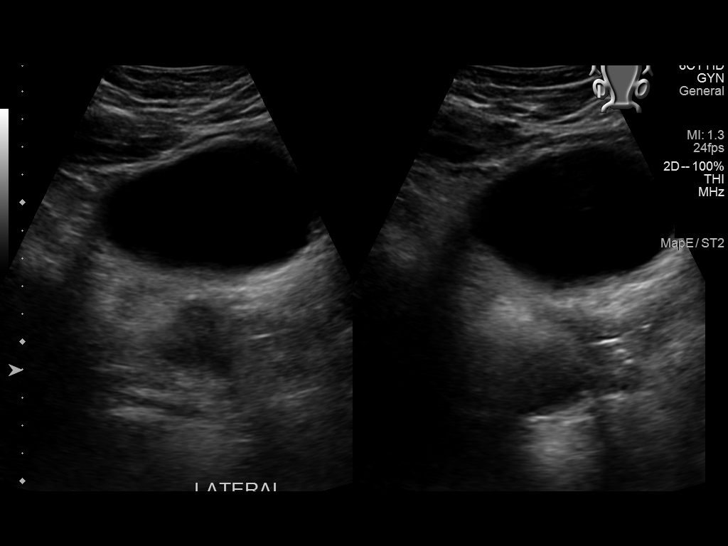
[im 32/91]
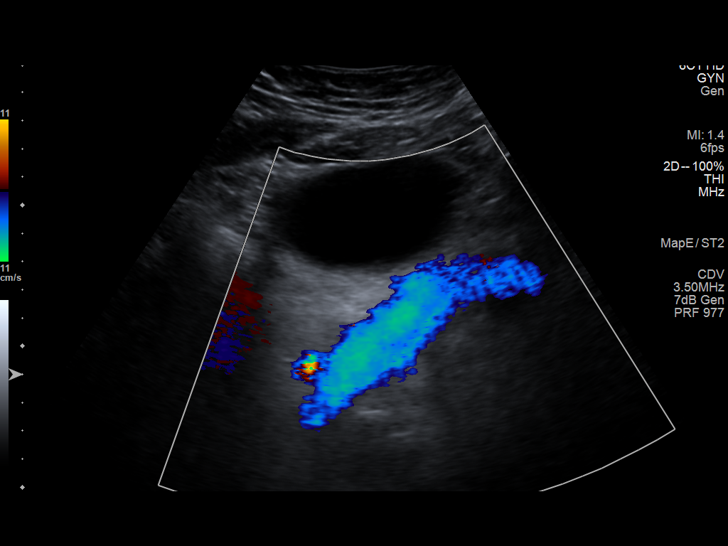
[im 40/91]
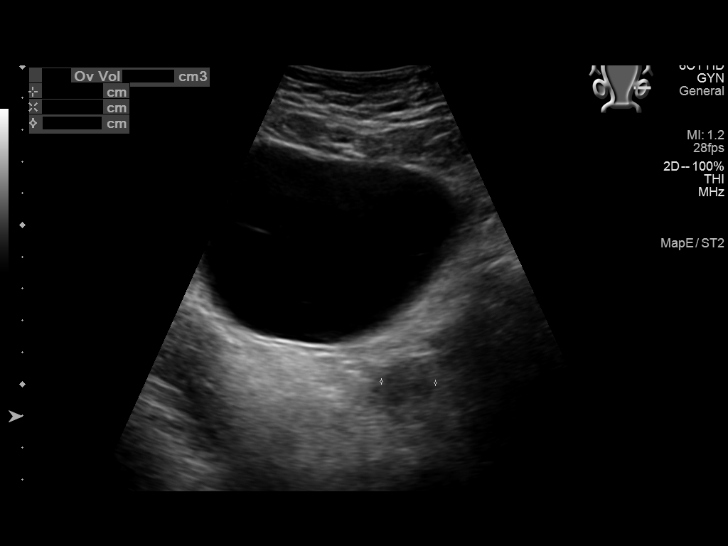
[im 47/91]
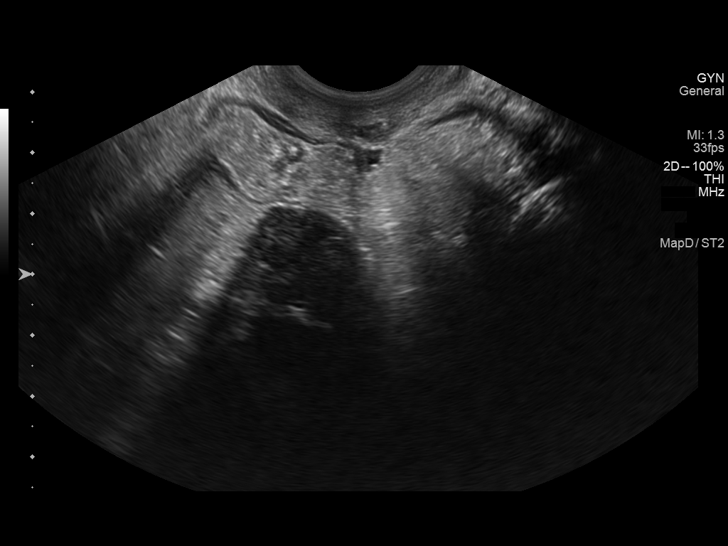
[im 55/91]
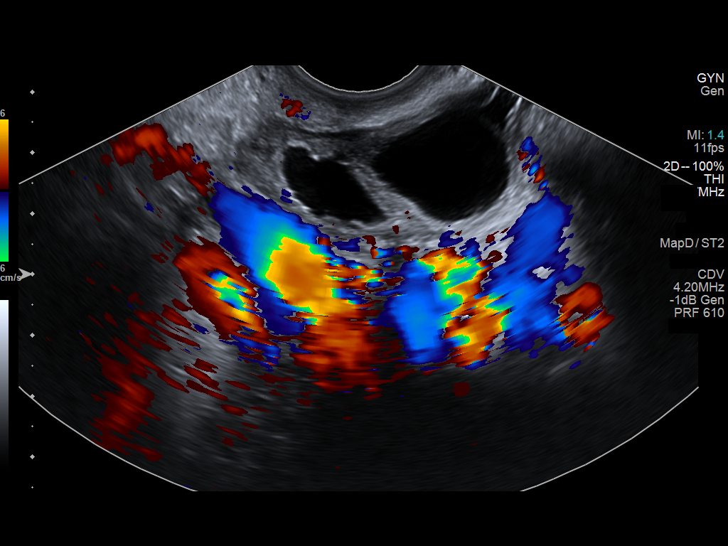
[im 63/91]
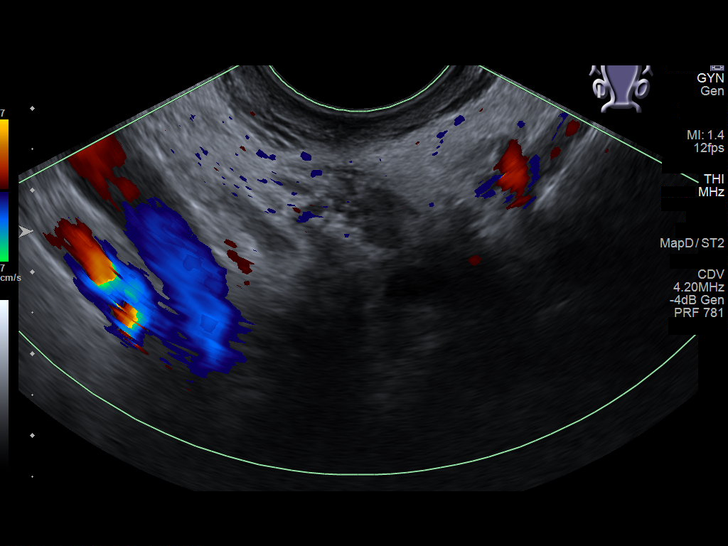
[im 71/91]
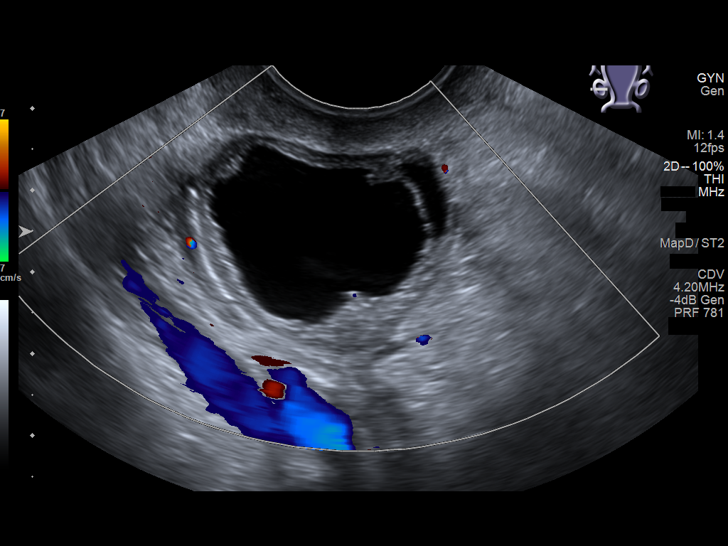
[im 79/91]
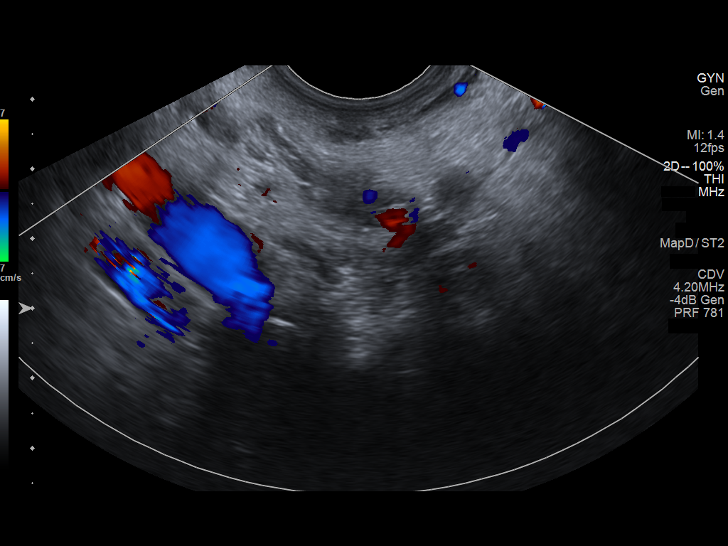
[im 87/91]
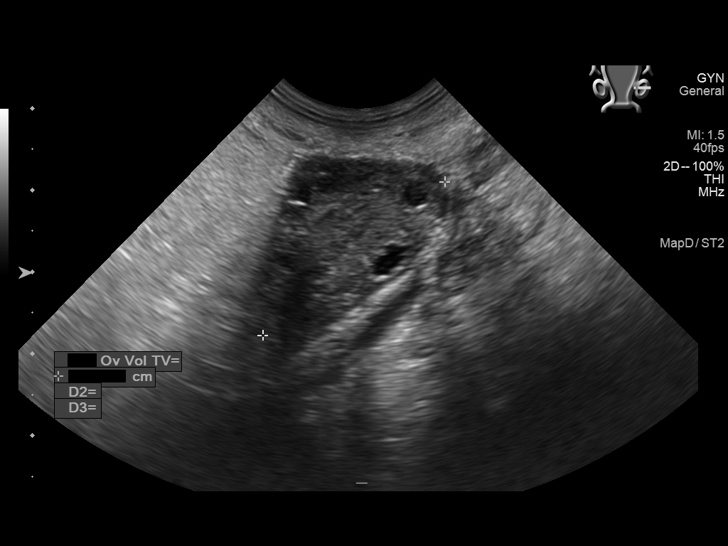

[Series 1: us pelvic complete w transvaginal and torsion righ · 1 of 5 slices shown]
[im 1/5]
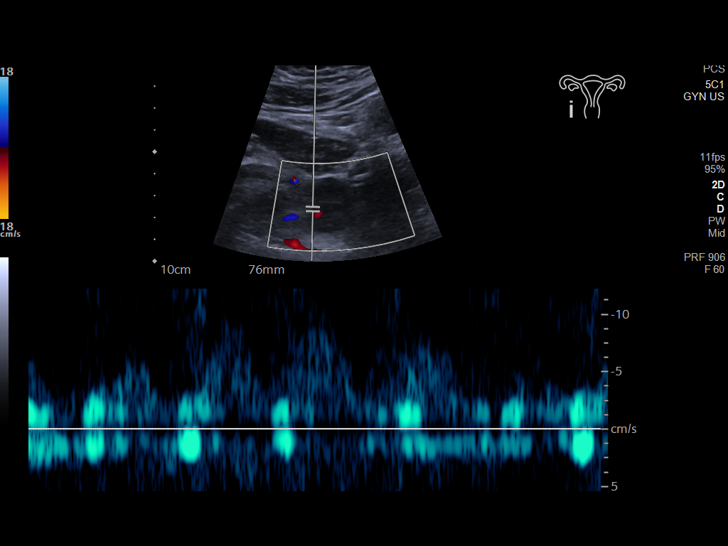

[13 of 25 positions shown; findings below may reference images not displayed]

FINDINGS: Uterus

Hysterectomy.

Endometrium

Hysterectomy.

Right ovary

Measurements: 4.8 x 2.7 x 3.4 cm = volume: 23.4 mL. There is a 3.2 x
2.1 x 2.9 cm septated/complex cyst or 2 adjacent cysts in the right
ovary versus paraovarian cyst.

Left ovary

Measurements: 2.9 x 2.3 x 1.9 cm = volume: 6.6 mL. Normal
appearance/no adnexal mass.

Pulsed Doppler evaluation of both ovaries demonstrates normal
low-resistance arterial and venous waveforms.

Other findings

No abnormal free fluid.
IMPRESSION: 1. Complex/septated right ovarian or paraovarian cysts.
2. Hysterectomy.

## 2020-07-23 IMAGING — CR DG CHEST 2V
1 series · 2 of 2 positions shown · non-contrast
Comparison: CT angiogram chest [DATE]. prior chest radiographs
[DATE] and earlier.

CLINICAL DATA: Chest pain. Additional history provided: Patient
reports acid reflux, vomiting, mid chest pain.

EXAM:
CHEST - 2 VIEW

[Series 1: dg chest 2 view · 0.14mm/px · 2 of 2 slices shown]
[im 1/2]
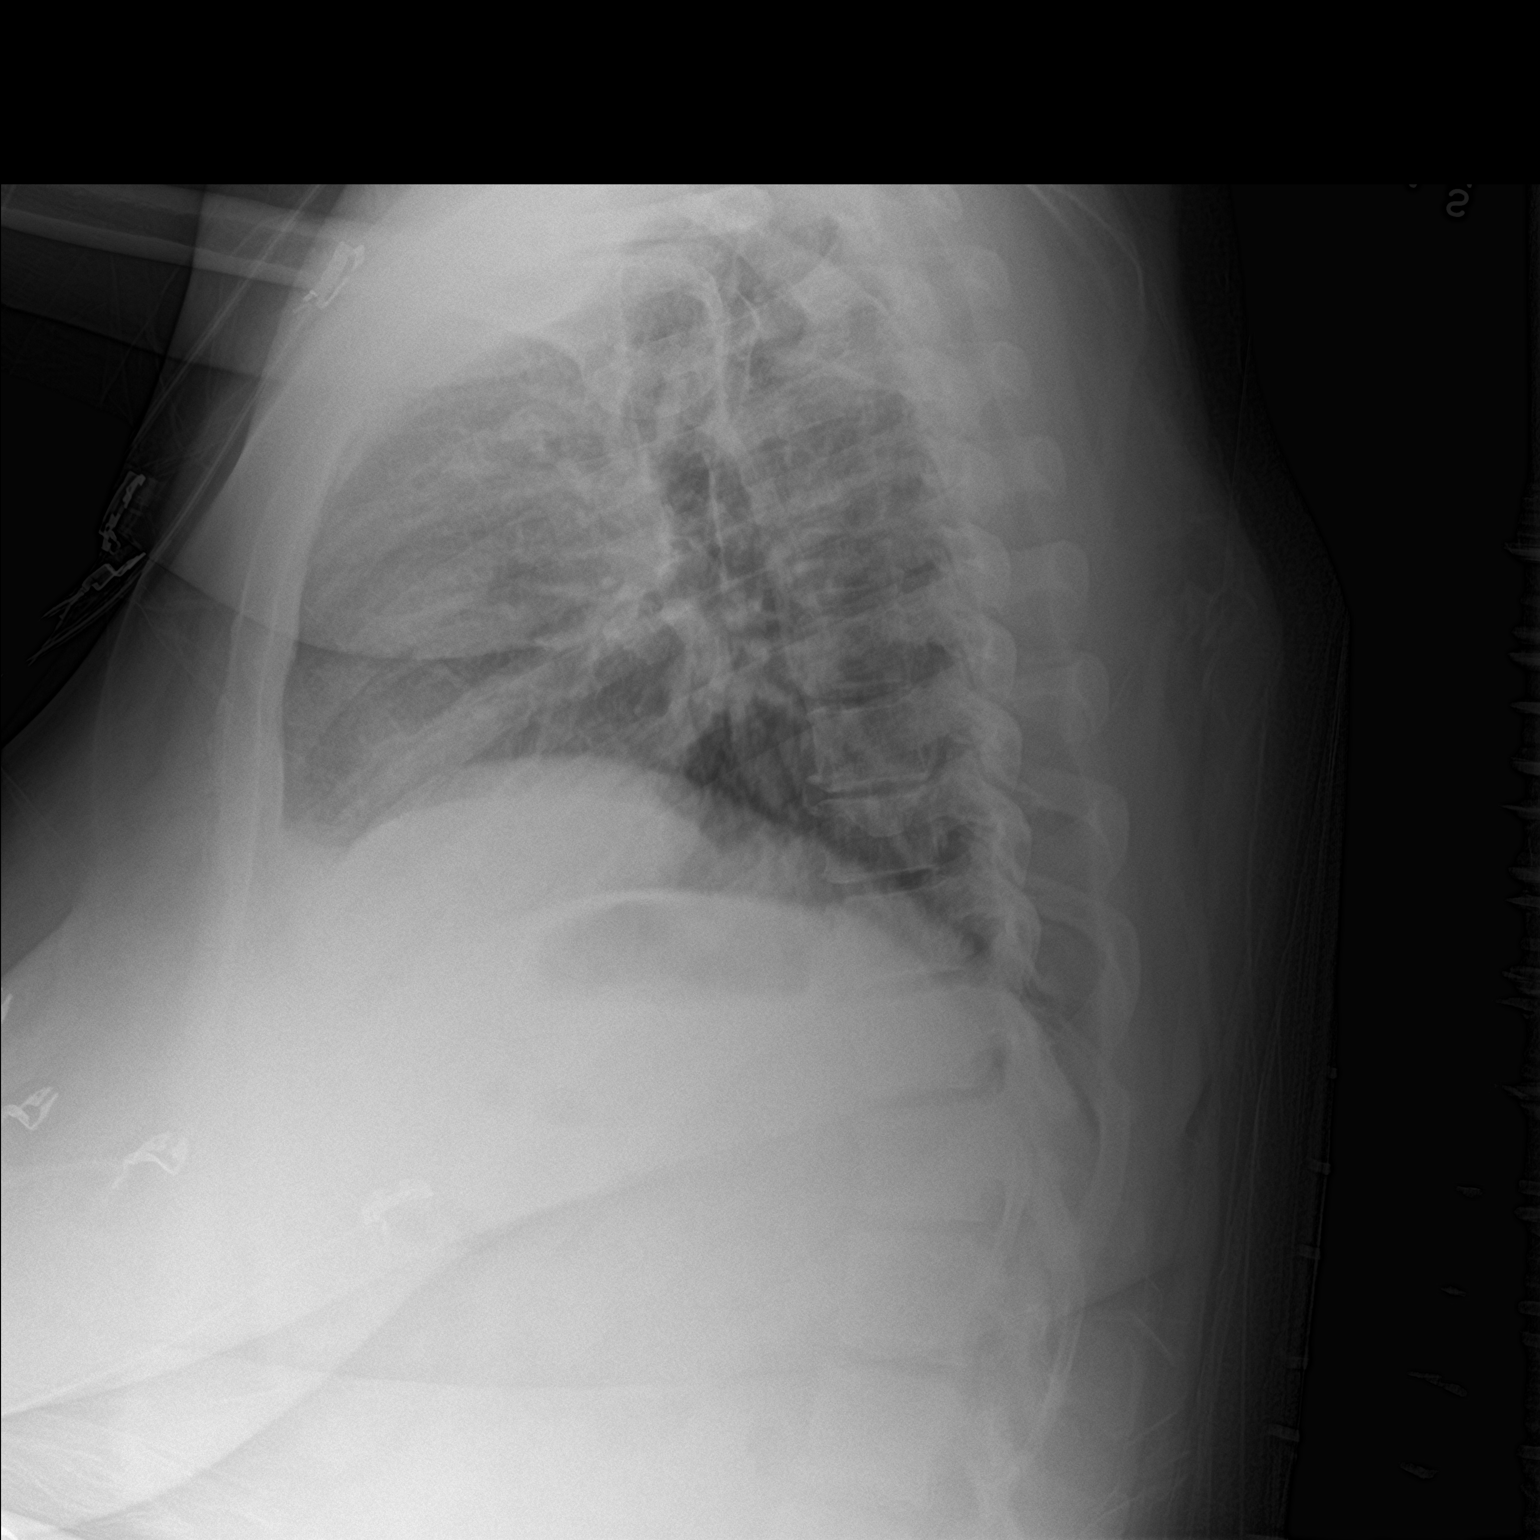
[im 2/2]
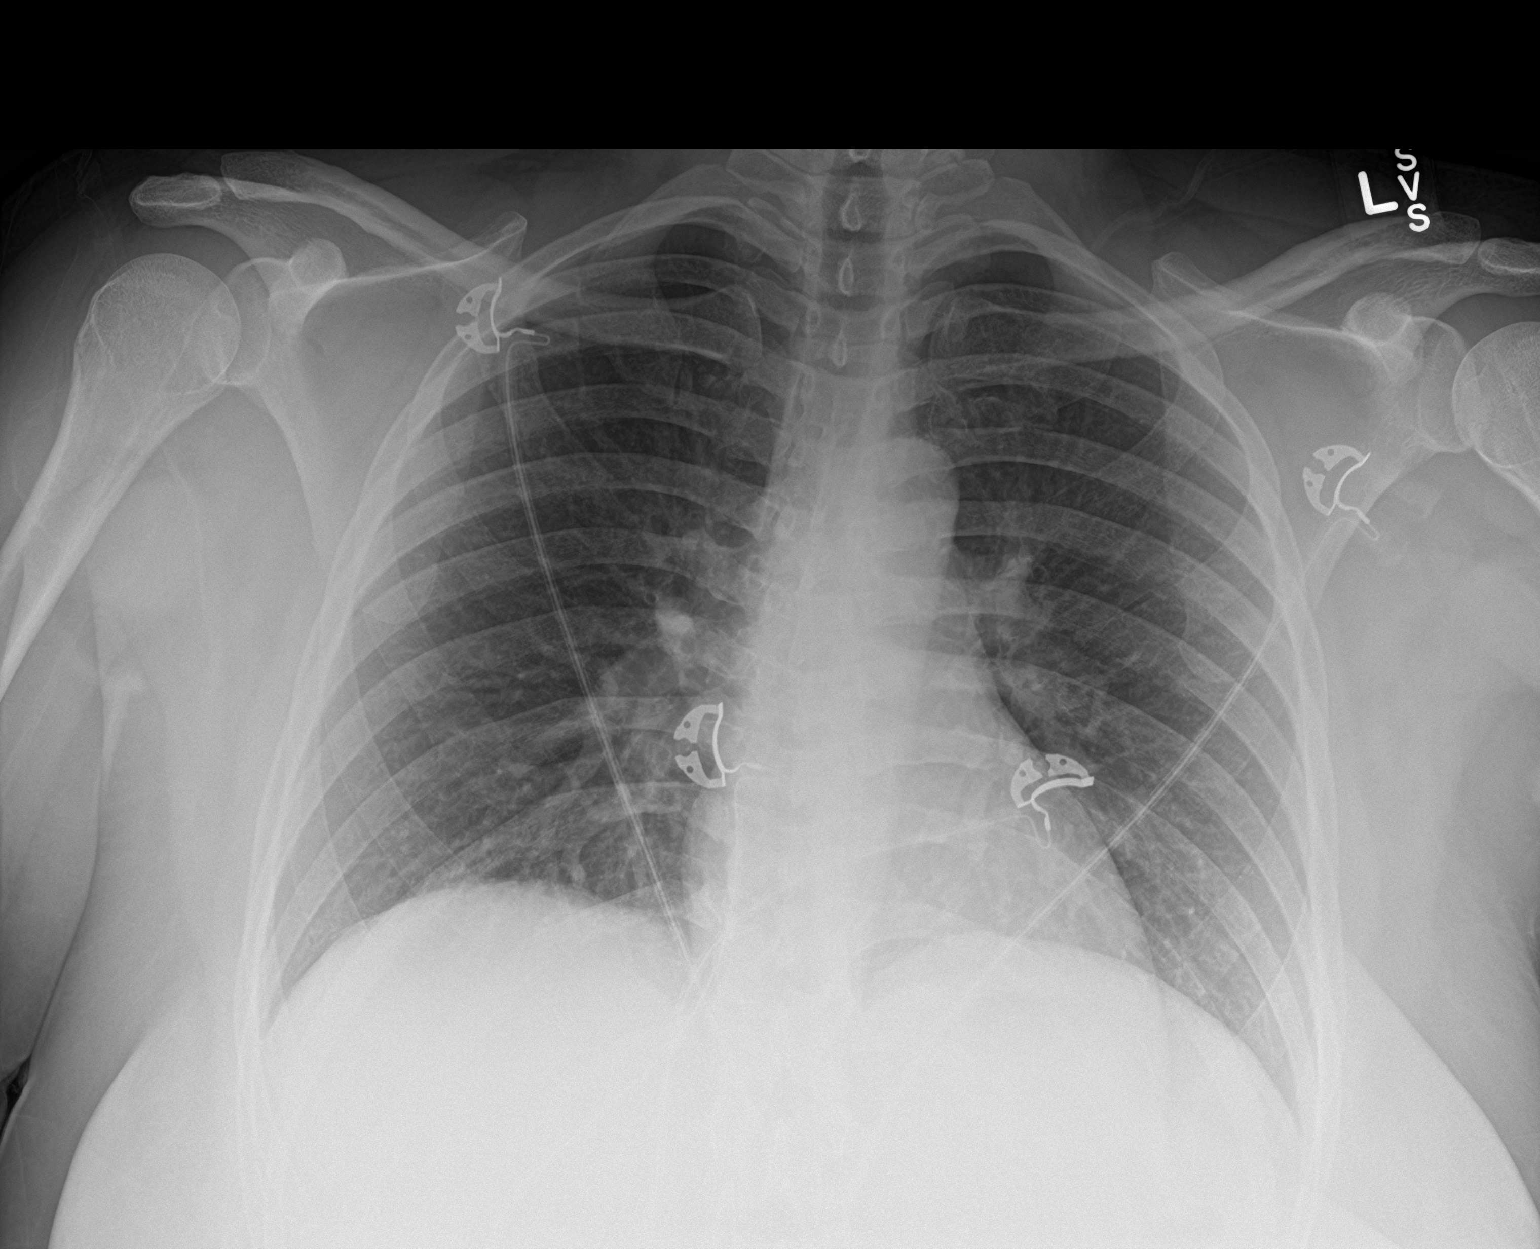

[2 of 2 positions shown; findings below may reference images not displayed]

FINDINGS: Heart size within normal limits. No appreciable airspace
consolidation. No evidence of pleural effusion or pneumothorax. No
acute bony abnormality identified.
IMPRESSION: No evidence of active cardiopulmonary disease.

## 2020-07-23 IMAGING — CT CT HEAD W/O CM
4 series · 16 of 47 positions shown, 18 images · non-contrast
Comparison: Brain MRI dated [DATE].

CLINICAL DATA: 39-year-old female with dizziness.

EXAM:
CT HEAD WITHOUT CONTRAST
TECHNIQUE: Contiguous axial images were obtained from the base of the skull
through the vertex without intravenous contrast.

[Series 2: head (person_name) (person_name) · axial · 0.43mm/px · z∈[-175,-55]mm · 7 of 32 slices shown, 9 images]
[im 4/32  brain]
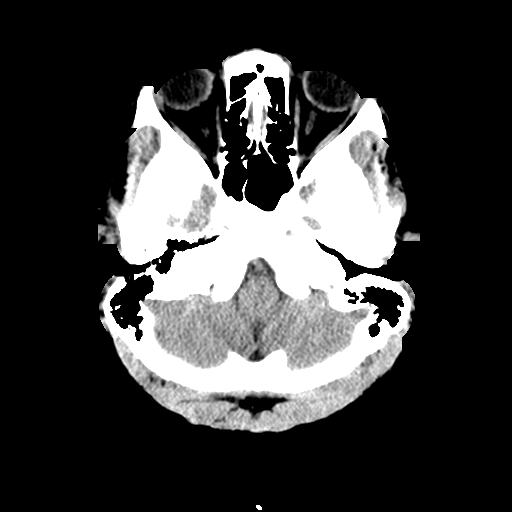
[im 4/32  bone]
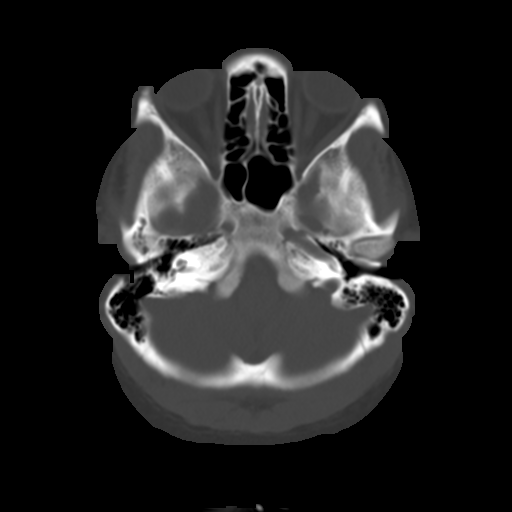
[im 8/32  brain]
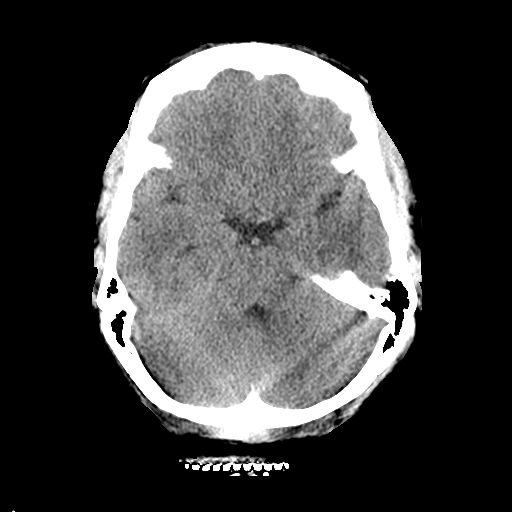
[im 12/32  brain]
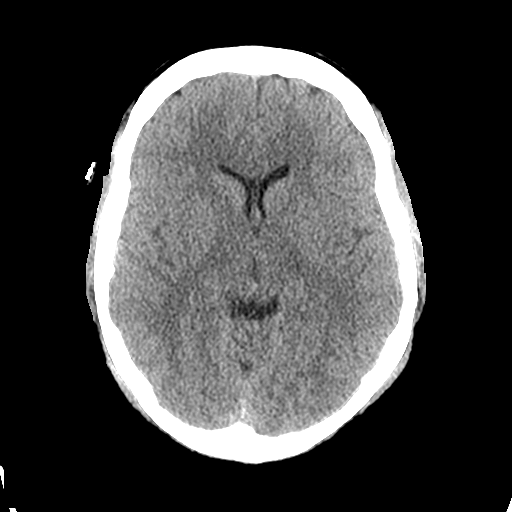
[im 16/32  brain]
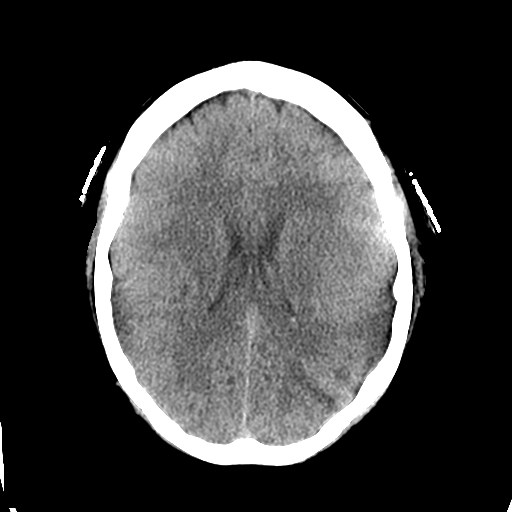
[im 20/32  brain]
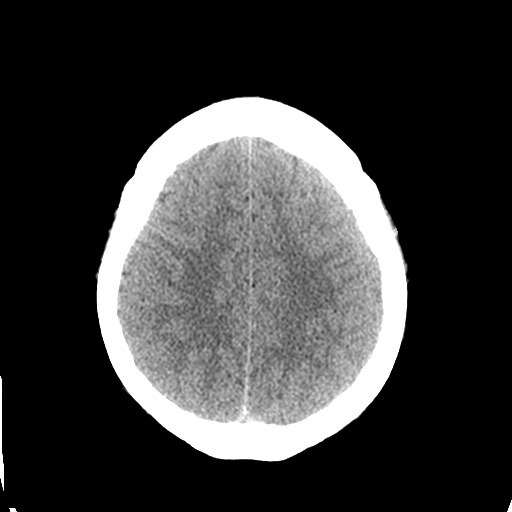
[im 20/32  bone]
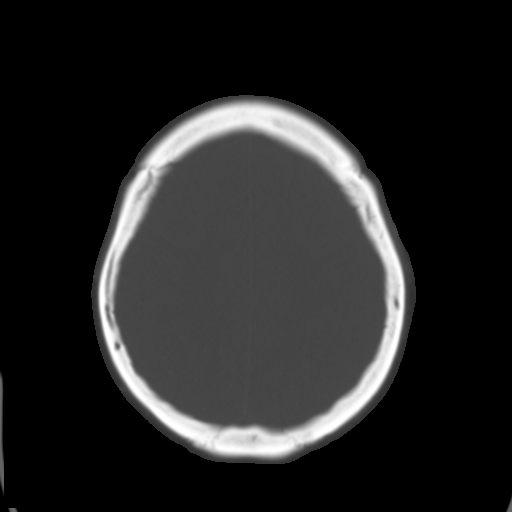
[im 24/32  brain]
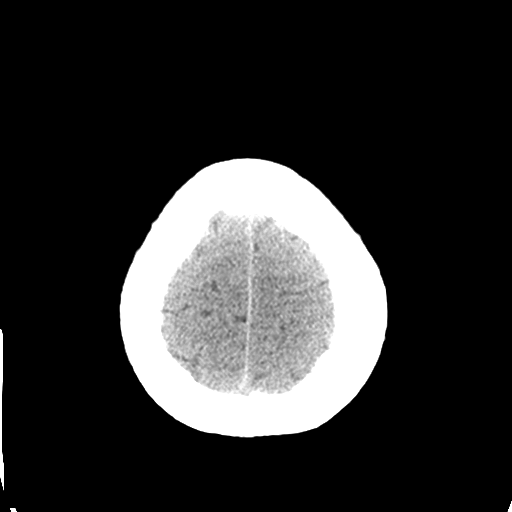
[im 28/32  brain]
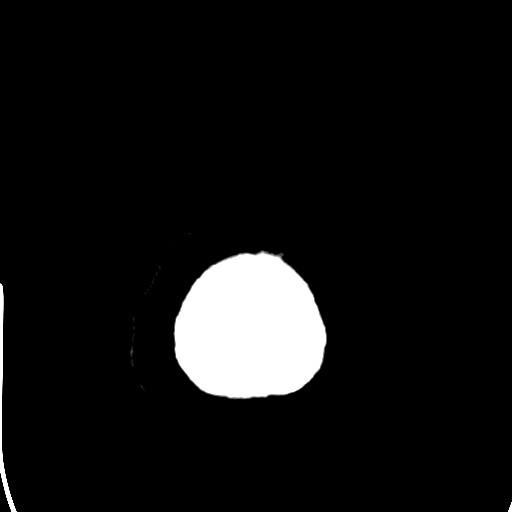

[Series 3: head bone · axial · 0.43mm/px · z∈[-176,-144]mm · 3 of 78 slices shown]
[im 8/78  bone]
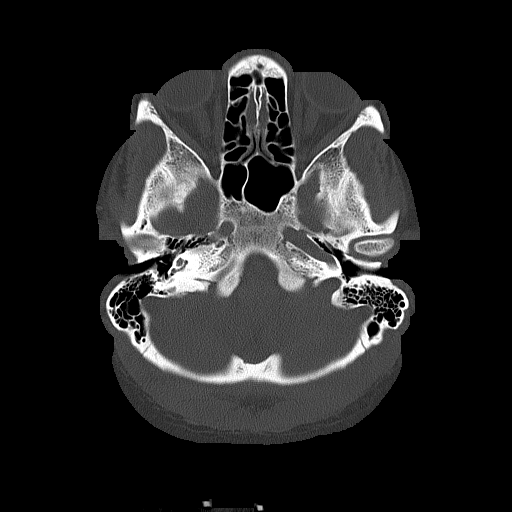
[im 16/78  bone]
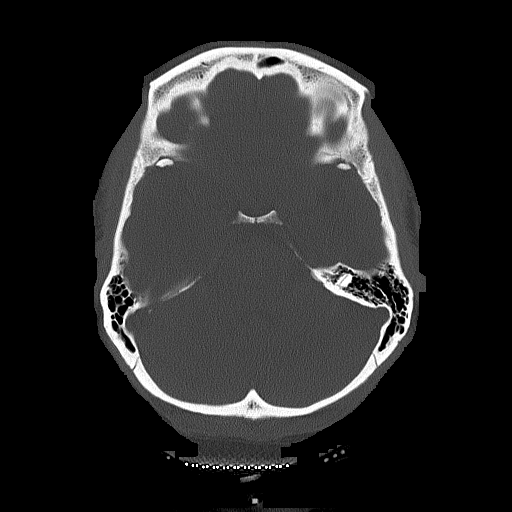
[im 24/78  bone]
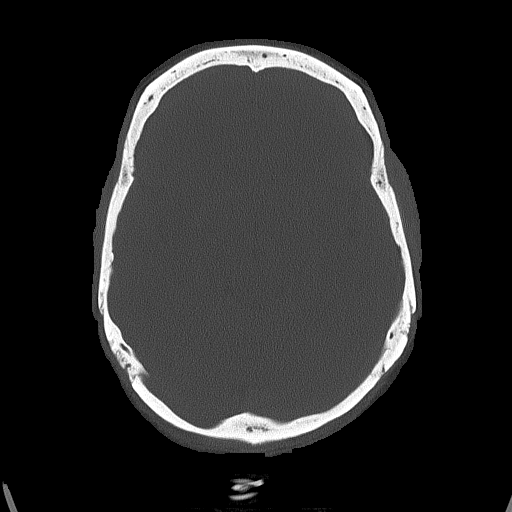

[Series 4: coronal soft tissue · coronal · 0.30mm/px · 3 of 62 slices shown]
[im 21/62  brain]
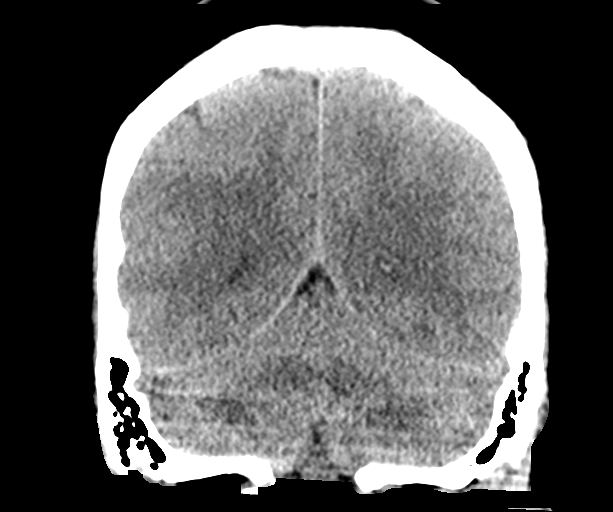
[im 28/62  brain]
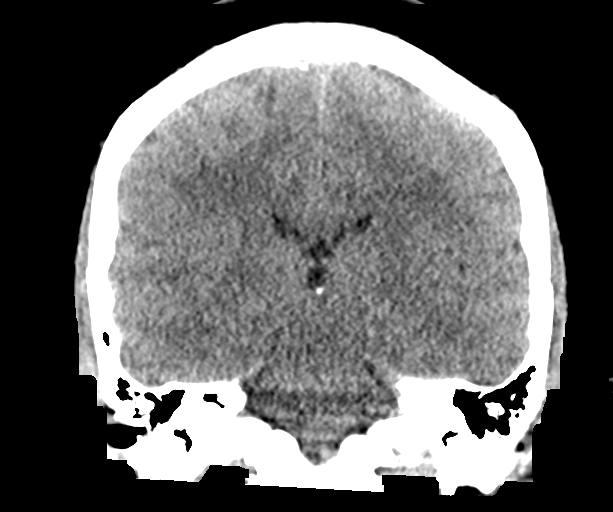
[im 34/62  brain]
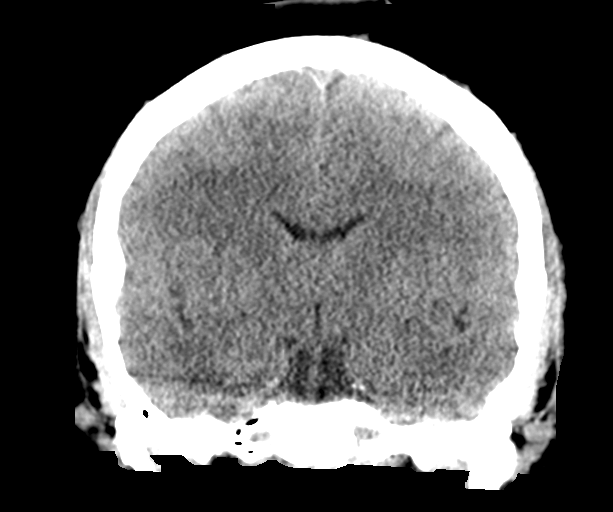

[Series 5: sagittal soft tissue · sagittal · 0.31mm/px · 3 of 54 slices shown]
[im 18/54  brain]
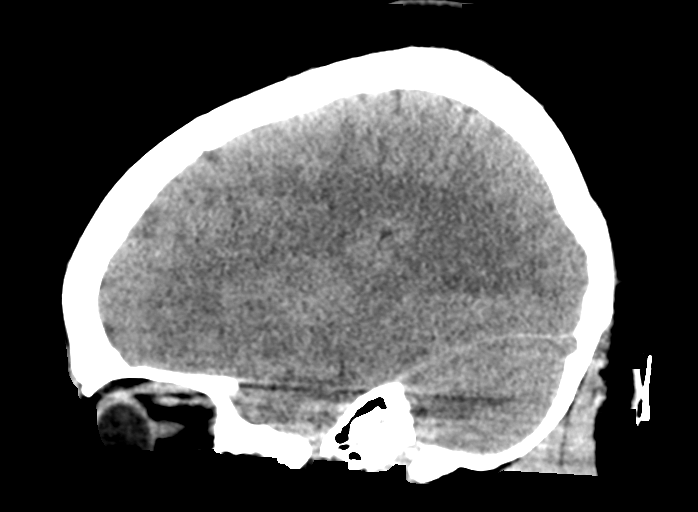
[im 27/54  brain]
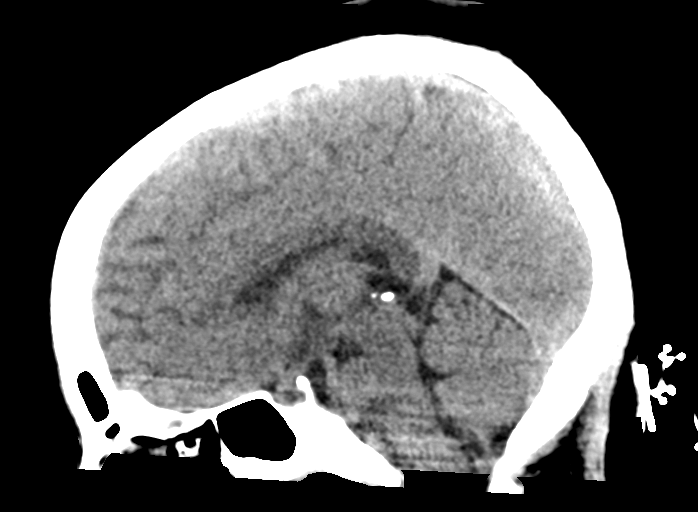
[im 36/54  brain]
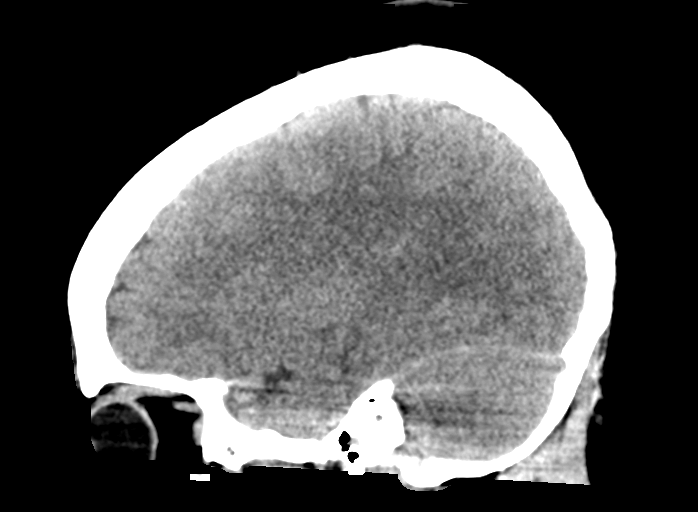

[16 of 47 positions shown; findings below may reference images not displayed]

FINDINGS: Evaluation of this exam is limited due to motion artifact.

Brain: The ventricles and sulci are appropriate size for the
patient's age. The gray-white matter discrimination is preserved.
There is no acute intracranial hemorrhage. No mass effect or midline
shift. No extra-axial fluid collection.

Vascular: No hyperdense vessel or unexpected calcification.

Skull: Normal. Negative for fracture or focal lesion.

Sinuses/Orbits: No acute finding.

Other: None
IMPRESSION: Unremarkable noncontrast CT of the brain.

## 2020-07-23 MED ORDER — ACETAMINOPHEN 650 MG RE SUPP
650.0000 mg | Freq: Four times a day (QID) | RECTAL | Status: DC | PRN
  Filled 2020-07-23: qty 1

## 2020-07-23 MED ORDER — ACETAMINOPHEN 500 MG PO TABS
1000.0000 mg | ORAL_TABLET | Freq: Once | ORAL | Status: AC
  Administered 2020-07-23: 1000 mg via ORAL
  Filled 2020-07-23: qty 2

## 2020-07-23 MED ORDER — SODIUM CHLORIDE 0.9 % IV SOLN
12.5000 mg | Freq: Four times a day (QID) | INTRAVENOUS | Status: DC | PRN
  Administered 2020-07-23 – 2020-07-24 (×2): 12.5 mg via INTRAVENOUS
  Filled 2020-07-23: qty 12.5
  Filled 2020-07-23: qty 0.5

## 2020-07-23 MED ORDER — ACETAMINOPHEN 325 MG PO TABS
650.0000 mg | ORAL_TABLET | Freq: Four times a day (QID) | ORAL | Status: DC | PRN
  Administered 2020-07-23 – 2020-07-28 (×5): 650 mg via ORAL
  Filled 2020-07-23 (×5): qty 2

## 2020-07-23 MED ORDER — SODIUM CHLORIDE 0.9 % IV SOLN
2.0000 g | Freq: Three times a day (TID) | INTRAVENOUS | Status: DC
  Administered 2020-07-24 – 2020-07-25 (×5): 2 g via INTRAVENOUS
  Filled 2020-07-23 (×6): qty 2

## 2020-07-23 MED ORDER — SODIUM CHLORIDE 0.9 % IV BOLUS
1000.0000 mL | Freq: Once | INTRAVENOUS | Status: AC
  Administered 2020-07-23: 1000 mL via INTRAVENOUS

## 2020-07-23 MED ORDER — VANCOMYCIN HCL 2000 MG/400ML IV SOLN
2000.0000 mg | Freq: Once | INTRAVENOUS | Status: AC
  Administered 2020-07-24: 2000 mg via INTRAVENOUS
  Filled 2020-07-23 (×2): qty 400

## 2020-07-23 MED ORDER — VANCOMYCIN HCL 1250 MG/250ML IV SOLN
1250.0000 mg | INTRAVENOUS | Status: DC
  Filled 2020-07-23: qty 250

## 2020-07-23 MED ORDER — SODIUM CHLORIDE 0.9 % IV SOLN: INTRAVENOUS | Status: DC

## 2020-07-23 MED ORDER — LACTATED RINGERS IV BOLUS
1000.0000 mL | Freq: Once | INTRAVENOUS | Status: AC
  Administered 2020-07-23: 1000 mL via INTRAVENOUS

## 2020-07-23 MED ORDER — ONDANSETRON HCL 4 MG/2ML IJ SOLN
4.0000 mg | Freq: Once | INTRAMUSCULAR | Status: AC
  Administered 2020-07-23: 4 mg via INTRAVENOUS
  Filled 2020-07-23: qty 2

## 2020-07-23 MED ORDER — KETOROLAC TROMETHAMINE 30 MG/ML IJ SOLN
15.0000 mg | Freq: Once | INTRAMUSCULAR | Status: AC
  Administered 2020-07-23: 15 mg via INTRAVENOUS
  Filled 2020-07-23: qty 1

## 2020-07-23 MED ORDER — IOHEXOL 300 MG/ML  SOLN
100.0000 mL | Freq: Once | INTRAMUSCULAR | Status: AC | PRN
  Administered 2020-07-23: 100 mL via INTRAVENOUS

## 2020-07-23 MED ORDER — METRONIDAZOLE 500 MG/100ML IV SOLN
500.0000 mg | Freq: Three times a day (TID) | INTRAVENOUS | Status: DC
Start: 2020-07-23 — End: 2020-07-25
  Administered 2020-07-23 – 2020-07-25 (×5): 500 mg via INTRAVENOUS
  Filled 2020-07-23 (×8): qty 100
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
## 2020-07-23 NOTE — ED Triage Notes (Signed)
C/O vomiting x 2 today.  AAOx3.  Skin warm and dry. NAD
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
## 2020-07-23 NOTE — Consult Note (Signed)
CODE SEPSIS - PHARMACY COMMUNICATION  **Broad Spectrum Antibiotics should be administered within 1 hour of Sepsis diagnosis**  Time Code Sepsis Called/Page Received: 2122  Antibiotics Ordered: cefepime, vancomycin, metronidazole  Time of 1st antibiotic administration:   Additional action taken by pharmacy: called ED nurse for documentation of code sepsis antibiotics reminder  If necessary, Name of Provider/Nurse Contacted: Sheryle Spray, RN    Dorothe Pea ,PharmD, BCPS Clinical Pharmacist  07/23/2020  9:49 PM

## 2020-07-23 NOTE — Consult Note (Signed)
Pharmacy Antibiotic Note  Anita Newton is a 40 y.o. female admitted on 07/23/2020 with concerns for abdominal pain/vomiting.  Pharmacy has been consulted for cefepime and vancomycin dosing for sepsis   Plan: Initiate cefepime 2 gram Q8H Vancomycin 2000 mg IV LD x 1 followed by Vancomycin 1250 mg Q24H. Goal AUC 400-550 Expected AUC 465 Scr used: 0.8 Follow up cultures Monitor renal function  Height: 5' (152.4 cm) Weight: 89.4 kg (197 lb 1.5 oz) IBW/kg (Calculated) : 45.5  Temp (24hrs), Avg:100 F (37.8 C), Min:98.6 F (37 C), Max:101.4 F (38.6 C)  Recent Labs  Lab 07/23/20 1050 07/23/20 2020  WBC 9.4  9.2  --   CREATININE 0.63  --   LATICACIDVEN  --  1.1    Estimated Creatinine Clearance: 93.1 mL/min (by C-G formula based on SCr of 0.63 mg/dL).    Allergies  Allergen Reactions   Cherry Hives and Swelling    Fresh cherries with a stem   Morphine And Related     Pt doesn't like the way it makes her feel when coming off it. If has to have, will use.    Antimicrobials this admission: 6/13 cefepime >>  6/13 vancomycin >>  6/13 Flagyl >>  Dose adjustments this admission: N/a  Microbiology results: 6/13 BCx: sent 6/13 Respiratory pathogens: sent   Thank you for allowing pharmacy to be a part of this patient's care.  Dorothe Pea, PharmD, BCPS Clinical Pharmacist   07/23/2020 10:04 PM

## 2020-07-24 ENCOUNTER — Encounter
Admission: EM | Disposition: A | Discharge status: Home / Self Care | Payer: Self-pay | Source: Home / Self Care | Attending: Internal Medicine

## 2020-07-24 ENCOUNTER — Inpatient Hospital Stay: Payer: Managed Care, Other (non HMO) | Admitting: Certified Registered Nurse Anesthetist

## 2020-07-24 DIAGNOSIS — Z833 Family history of diabetes mellitus: Secondary | ICD-10-CM | POA: Diagnosis not present

## 2020-07-24 DIAGNOSIS — E860 Dehydration: Secondary | ICD-10-CM | POA: Diagnosis present

## 2020-07-24 DIAGNOSIS — N83201 Unspecified ovarian cyst, right side: Secondary | ICD-10-CM | POA: Diagnosis not present

## 2020-07-24 DIAGNOSIS — A419 Sepsis, unspecified organism: Secondary | ICD-10-CM

## 2020-07-24 DIAGNOSIS — A084 Viral intestinal infection, unspecified: Secondary | ICD-10-CM | POA: Diagnosis present

## 2020-07-24 DIAGNOSIS — Z79899 Other long term (current) drug therapy: Secondary | ICD-10-CM | POA: Diagnosis not present

## 2020-07-24 DIAGNOSIS — E559 Vitamin D deficiency, unspecified: Secondary | ICD-10-CM | POA: Diagnosis present

## 2020-07-24 DIAGNOSIS — N736 Female pelvic peritoneal adhesions (postinfective): Secondary | ICD-10-CM | POA: Diagnosis present

## 2020-07-24 DIAGNOSIS — I10 Essential (primary) hypertension: Secondary | ICD-10-CM | POA: Diagnosis present

## 2020-07-24 DIAGNOSIS — Z8249 Family history of ischemic heart disease and other diseases of the circulatory system: Secondary | ICD-10-CM | POA: Diagnosis not present

## 2020-07-24 DIAGNOSIS — Z6839 Body mass index (BMI) 39.0-39.9, adult: Secondary | ICD-10-CM | POA: Diagnosis not present

## 2020-07-24 DIAGNOSIS — F419 Anxiety disorder, unspecified: Secondary | ICD-10-CM | POA: Diagnosis present

## 2020-07-24 DIAGNOSIS — R000 Tachycardia, unspecified: Secondary | ICD-10-CM | POA: Diagnosis present

## 2020-07-24 DIAGNOSIS — F32A Depression, unspecified: Secondary | ICD-10-CM | POA: Diagnosis present

## 2020-07-24 DIAGNOSIS — R112 Nausea with vomiting, unspecified: Secondary | ICD-10-CM | POA: Diagnosis not present

## 2020-07-24 DIAGNOSIS — Z885 Allergy status to narcotic agent status: Secondary | ICD-10-CM | POA: Diagnosis not present

## 2020-07-24 DIAGNOSIS — Z20822 Contact with and (suspected) exposure to covid-19: Secondary | ICD-10-CM | POA: Diagnosis present

## 2020-07-24 DIAGNOSIS — R1031 Right lower quadrant pain: Secondary | ICD-10-CM | POA: Diagnosis not present

## 2020-07-24 HISTORY — PX: LAPAROSCOPIC OVARIAN CYSTECTOMY: SHX6248

## 2020-07-24 LAB — GLUCOSE, CAPILLARY: Glucose-Capillary: 91 mg/dL (ref 70–99)

## 2020-07-24 LAB — COMPREHENSIVE METABOLIC PANEL
ALT: 26 U/L (ref 0–44)
AST: 18 U/L (ref 15–41)
Albumin: 3.2 g/dL — ABNORMAL LOW (ref 3.5–5.0)
Alkaline Phosphatase: 55 U/L (ref 38–126)
Anion gap: 4 — ABNORMAL LOW (ref 5–15)
BUN: 11 mg/dL (ref 6–20)
CO2: 25 mmol/L (ref 22–32)
Calcium: 7.9 mg/dL — ABNORMAL LOW (ref 8.9–10.3)
Chloride: 106 mmol/L (ref 98–111)
Creatinine, Ser: 0.65 mg/dL (ref 0.44–1.00)
GFR, Estimated: >60 mL/min (ref >60)
Glucose, Bld: 95 mg/dL (ref 70–99)
Potassium: 2.9 mmol/L — ABNORMAL LOW (ref 3.5–5.1)
Sodium: 135 mmol/L (ref 135–145)
Total Bilirubin: 1 mg/dL (ref 0.3–1.2)
Total Protein: 6.1 g/dL — ABNORMAL LOW (ref 6.5–8.1)

## 2020-07-24 LAB — CBC WITH DIFFERENTIAL/PLATELET
Abs Immature Granulocytes: 0.01 10*3/uL (ref 0.00–0.07)
Basophils Absolute: 0 10*3/uL (ref 0.0–0.1)
Basophils Relative: 0 %
Eosinophils Absolute: 0 10*3/uL (ref 0.0–0.5)
Eosinophils Relative: 0 %
HCT: 29.9 % — ABNORMAL LOW (ref 36.0–46.0)
Hemoglobin: 10.1 g/dL — ABNORMAL LOW (ref 12.0–15.0)
Immature Granulocytes: 0 %
Lymphocytes Relative: 13 %
Lymphs Abs: 0.7 10*3/uL (ref 0.7–4.0)
MCH: 29.7 pg (ref 26.0–34.0)
MCHC: 33.8 g/dL (ref 30.0–36.0)
MCV: 87.9 fL (ref 80.0–100.0)
Monocytes Absolute: 0.3 10*3/uL (ref 0.1–1.0)
Monocytes Relative: 6 %
Neutro Abs: 4.1 10*3/uL (ref 1.7–7.7)
Neutrophils Relative %: 81 %
Platelets: 270 10*3/uL (ref 150–400)
RBC: 3.4 MIL/uL — ABNORMAL LOW (ref 3.87–5.11)
RDW: 12.1 % (ref 11.5–15.5)
WBC: 5.1 10*3/uL (ref 4.0–10.5)
nRBC: 0 % (ref 0.0–0.2)

## 2020-07-24 LAB — HIV ANTIBODY (ROUTINE TESTING W REFLEX): HIV Screen 4th Generation wRfx: NONREACTIVE

## 2020-07-24 LAB — MAGNESIUM: Magnesium: 1.5 mg/dL — ABNORMAL LOW (ref 1.7–2.4)

## 2020-07-24 LAB — PHOSPHORUS: Phosphorus: 2.6 mg/dL (ref 2.5–4.6)

## 2020-07-24 LAB — POTASSIUM: Potassium: 3.2 mmol/L — ABNORMAL LOW (ref 3.5–5.1)

## 2020-07-24 LAB — TSH: TSH: 0.324 u[IU]/mL — ABNORMAL LOW (ref 0.350–4.500)

## 2020-07-24 SURGERY — EXCISION, CYST, OVARY, LAPAROSCOPIC
Anesthesia: General | Site: Abdomen

## 2020-07-24 MED ORDER — SEVOFLURANE IN SOLN
RESPIRATORY_TRACT | Status: AC
  Filled 2020-07-24: qty 250

## 2020-07-24 MED ORDER — DICYCLOMINE HCL 20 MG PO TABS
20.0000 mg | ORAL_TABLET | Freq: Three times a day (TID) | ORAL | Status: DC
  Administered 2020-07-25 – 2020-07-28 (×9): 20 mg via ORAL
  Filled 2020-07-24 (×13): qty 1

## 2020-07-24 MED ORDER — LACTATED RINGERS IV SOLN: INTRAVENOUS | Status: DC

## 2020-07-24 MED ORDER — LACTATED RINGERS IV SOLN: INTRAVENOUS | Status: DC | PRN

## 2020-07-24 MED ORDER — POTASSIUM CHLORIDE 10 MEQ/100ML IV SOLN
10.0000 meq | INTRAVENOUS | Status: DC
  Administered 2020-07-24 – 2020-07-25 (×3): 10 meq via INTRAVENOUS
  Filled 2020-07-24 (×3): qty 100

## 2020-07-24 MED ORDER — MIDAZOLAM HCL 2 MG/2ML IJ SOLN
INTRAMUSCULAR | Status: AC
  Filled 2020-07-24: qty 2

## 2020-07-24 MED ORDER — ACETAMINOPHEN 10 MG/ML IV SOLN
INTRAVENOUS | Status: AC
  Filled 2020-07-24: qty 100

## 2020-07-24 MED ORDER — ACETAMINOPHEN 10 MG/ML IV SOLN
INTRAVENOUS | Status: DC | PRN
  Administered 2020-07-24: 1000 mg via INTRAVENOUS

## 2020-07-24 MED ORDER — ROCURONIUM BROMIDE 100 MG/10ML IV SOLN
INTRAVENOUS | Status: DC | PRN
  Administered 2020-07-24: 30 mg via INTRAVENOUS

## 2020-07-24 MED ORDER — LIDOCAINE HCL (CARDIAC) PF 100 MG/5ML IV SOSY
PREFILLED_SYRINGE | INTRAVENOUS | Status: DC | PRN
  Administered 2020-07-24: 100 mg via INTRAVENOUS

## 2020-07-24 MED ORDER — PANTOPRAZOLE SODIUM 40 MG PO TBEC
40.0000 mg | DELAYED_RELEASE_TABLET | Freq: Every day | ORAL | Status: DC
  Administered 2020-07-25: 40 mg via ORAL
  Filled 2020-07-24: qty 1

## 2020-07-24 MED ORDER — POTASSIUM CHLORIDE CRYS ER 20 MEQ PO TBCR
40.0000 meq | EXTENDED_RELEASE_TABLET | Freq: Once | ORAL | Status: AC
  Administered 2020-07-24: 40 meq via ORAL
  Filled 2020-07-24: qty 2

## 2020-07-24 MED ORDER — ONDANSETRON HCL 4 MG/2ML IJ SOLN
INTRAMUSCULAR | Status: DC | PRN
  Administered 2020-07-24: 4 mg via INTRAVENOUS

## 2020-07-24 MED ORDER — BUPIVACAINE HCL (PF) 0.5 % IJ SOLN
INTRAMUSCULAR | Status: AC
  Filled 2020-07-24: qty 30

## 2020-07-24 MED ORDER — SUGAMMADEX SODIUM 200 MG/2ML IV SOLN
INTRAVENOUS | Status: DC | PRN
  Administered 2020-07-24: 200 mg via INTRAVENOUS

## 2020-07-24 MED ORDER — PROPOFOL 10 MG/ML IV BOLUS
INTRAVENOUS | Status: DC | PRN
  Administered 2020-07-24: 200 mg via INTRAVENOUS

## 2020-07-24 MED ORDER — SUCCINYLCHOLINE CHLORIDE 20 MG/ML IJ SOLN
INTRAMUSCULAR | Status: DC | PRN
  Administered 2020-07-24: 100 mg via INTRAVENOUS

## 2020-07-24 MED ORDER — FENTANYL CITRATE (PF) 100 MCG/2ML IJ SOLN
INTRAMUSCULAR | Status: AC
  Administered 2020-07-24: 25 ug via INTRAVENOUS
  Filled 2020-07-24: qty 2

## 2020-07-24 MED ORDER — PHENYLEPHRINE HCL (PRESSORS) 10 MG/ML IV SOLN
INTRAVENOUS | Status: DC | PRN
  Administered 2020-07-24 (×2): 100 ug via INTRAVENOUS

## 2020-07-24 MED ORDER — FENTANYL CITRATE (PF) 100 MCG/2ML IJ SOLN
INTRAMUSCULAR | Status: AC
  Filled 2020-07-24: qty 2

## 2020-07-24 MED ORDER — PROPOFOL 10 MG/ML IV BOLUS
INTRAVENOUS | Status: AC
  Filled 2020-07-24: qty 20

## 2020-07-24 MED ORDER — BUPIVACAINE HCL 0.5 % IJ SOLN
INTRAMUSCULAR | Status: DC | PRN
  Administered 2020-07-24: 9 mL

## 2020-07-24 MED ORDER — PROMETHAZINE HCL 25 MG/ML IJ SOLN: 6.2500 mg | INTRAMUSCULAR | Status: DC | PRN

## 2020-07-24 MED ORDER — METOCLOPRAMIDE HCL 5 MG/ML IJ SOLN
10.0000 mg | Freq: Three times a day (TID) | INTRAMUSCULAR | Status: DC
  Administered 2020-07-25 – 2020-07-28 (×11): 10 mg via INTRAVENOUS
  Filled 2020-07-24 (×11): qty 2

## 2020-07-24 MED ORDER — DEXAMETHASONE SODIUM PHOSPHATE 10 MG/ML IJ SOLN
INTRAMUSCULAR | Status: DC | PRN
  Administered 2020-07-24: 10 mg via INTRAVENOUS

## 2020-07-24 MED ORDER — POVIDONE-IODINE 10 % EX SWAB: 2.0000 "application " | Freq: Once | CUTANEOUS | Status: DC

## 2020-07-24 MED ORDER — MAGNESIUM SULFATE 2 GM/50ML IV SOLN
2.0000 g | Freq: Once | INTRAVENOUS | Status: AC
  Administered 2020-07-24: 2 g via INTRAVENOUS
  Filled 2020-07-24: qty 50

## 2020-07-24 MED ORDER — FENTANYL CITRATE (PF) 100 MCG/2ML IJ SOLN
INTRAMUSCULAR | Status: DC | PRN
  Administered 2020-07-24: 50 ug via INTRAVENOUS
  Administered 2020-07-24: 25 ug via INTRAVENOUS

## 2020-07-24 MED ORDER — ONDANSETRON HCL 4 MG/2ML IJ SOLN
4.0000 mg | Freq: Four times a day (QID) | INTRAMUSCULAR | Status: DC | PRN
  Administered 2020-07-24 – 2020-07-27 (×2): 4 mg via INTRAVENOUS
  Filled 2020-07-24 (×2): qty 2

## 2020-07-24 MED ORDER — MIDAZOLAM HCL 2 MG/2ML IJ SOLN
INTRAMUSCULAR | Status: DC | PRN
  Administered 2020-07-24: 2 mg via INTRAVENOUS

## 2020-07-24 MED ORDER — OXYCODONE HCL 5 MG PO TABS
5.0000 mg | ORAL_TABLET | ORAL | Status: DC | PRN
  Filled 2020-07-24: qty 1

## 2020-07-24 MED ORDER — FENTANYL CITRATE (PF) 100 MCG/2ML IJ SOLN
25.0000 ug | INTRAMUSCULAR | Status: DC | PRN
  Administered 2020-07-24: 25 ug via INTRAVENOUS

## 2020-07-24 SURGICAL SUPPLY — 57 items
ADH SKN CLS APL DERMABOND .7 (GAUZE/BANDAGES/DRESSINGS) ×1
ANCHOR TIS RET SYS 235ML (MISCELLANEOUS) IMPLANT
APL PRP STRL LF DISP 70% ISPRP (MISCELLANEOUS) ×1
BAG DRN RND TRDRP ANRFLXCHMBR (UROLOGICAL SUPPLIES) ×1
BAG TISS RTRVL C235 10X14 (MISCELLANEOUS)
BAG URINE DRAIN 2000ML AR STRL (UROLOGICAL SUPPLIES) ×2 IMPLANT
BLADE SURG SZ11 CARB STEEL (BLADE) ×2 IMPLANT
CANISTER SUCT 1200ML W/VALVE (MISCELLANEOUS) ×1 IMPLANT
CATH FOLEY 2WAY  5CC 16FR (CATHETERS) ×2
CATH FOLEY 2WAY 5CC 16FR (CATHETERS) ×1
CATH URTH 16FR FL 2W BLN LF (CATHETERS) ×1 IMPLANT
CHLORAPREP W/TINT 26 (MISCELLANEOUS) ×2 IMPLANT
CNTNR SPEC 2.5X3XGRAD LEK (MISCELLANEOUS) ×2
CONT SPEC 4OZ STER OR WHT (MISCELLANEOUS) ×2
CONT SPEC 4OZ STRL OR WHT (MISCELLANEOUS) ×2
CONTAINER SPEC 2.5X3XGRAD LEK (MISCELLANEOUS) IMPLANT
COVER WAND RF STERILE (DRAPES) ×2 IMPLANT
DERMABOND ADVANCED (GAUZE/BANDAGES/DRESSINGS) ×1
DERMABOND ADVANCED .7 DNX12 (GAUZE/BANDAGES/DRESSINGS) ×1 IMPLANT
DRAPE LEGGINS SURG 28X43 STRL (DRAPES) ×2 IMPLANT
DRAPE UNDER BUTTOCK W/FLU (DRAPES) ×2 IMPLANT
GAUZE 4X4 16PLY RFD (DISPOSABLE) ×1 IMPLANT
GLOVE SURG ENC MOIS LTX SZ7 (GLOVE) ×6 IMPLANT
GLOVE SURG UNDER LTX SZ7.5 (GLOVE) ×5 IMPLANT
GOWN STRL REUS W/ TWL LRG LVL3 (GOWN DISPOSABLE) ×2 IMPLANT
GOWN STRL REUS W/ TWL XL LVL3 (GOWN DISPOSABLE) IMPLANT
GOWN STRL REUS W/TWL LRG LVL3 (GOWN DISPOSABLE) ×6
GOWN STRL REUS W/TWL XL LVL3 (GOWN DISPOSABLE)
GRASPER SUT TROCAR 14GX15 (MISCELLANEOUS) IMPLANT
IRRIGATION STRYKERFLOW (MISCELLANEOUS) IMPLANT
IRRIGATOR STRYKERFLOW (MISCELLANEOUS) ×2
IV LACTATED RINGERS 1000ML (IV SOLUTION) IMPLANT
IV NS 1000ML (IV SOLUTION) ×2
IV NS 1000ML BAXH (IV SOLUTION) IMPLANT
KIT PINK PAD W/HEAD ARE REST (MISCELLANEOUS) ×2 IMPLANT
KIT PINK PAD W/HEAD ARM REST (MISCELLANEOUS) ×1 IMPLANT
KIT TURNOVER CYSTO (KITS) ×2 IMPLANT
LABEL OR SOLS (LABEL) ×1 IMPLANT
LIGASURE VESSEL 5MM BLUNT TIP (ELECTROSURGICAL) ×1 IMPLANT
MANIFOLD NEPTUNE II (INSTRUMENTS) ×2 IMPLANT
MANIPULATOR UTERINE 4.5 ZUMI (MISCELLANEOUS) IMPLANT
NEEDLE HYPO 22GX1.5 SAFETY (NEEDLE) ×1 IMPLANT
NS IRRIG 500ML POUR BTL (IV SOLUTION) ×2 IMPLANT
PACK LAP CHOLECYSTECTOMY (MISCELLANEOUS) ×2 IMPLANT
PAD OB MATERNITY 4.3X12.25 (PERSONAL CARE ITEMS) ×2 IMPLANT
PAD PREP 24X41 OB/GYN DISP (PERSONAL CARE ITEMS) ×2 IMPLANT
SCISSORS METZENBAUM CVD 33 (INSTRUMENTS) IMPLANT
SET TUBE SMOKE EVAC HIGH FLOW (TUBING) ×2 IMPLANT
SHEARS HARMONIC ACE PLUS 36CM (ENDOMECHANICALS) IMPLANT
SLEEVE ENDOPATH XCEL 5M (ENDOMECHANICALS) ×4 IMPLANT
SOL PREP PROV IODINE SCRUB 4OZ (MISCELLANEOUS) ×1 IMPLANT
SUT MNCRL 4-0 (SUTURE) ×2
SUT MNCRL 4-0 27XMFL (SUTURE) ×1
SUT VIC AB 2-0 UR6 27 (SUTURE) ×2 IMPLANT
SUTURE MNCRL 4-0 27XMF (SUTURE) ×1 IMPLANT
TROCAR XCEL NON-BLD 5MMX100MML (ENDOMECHANICALS) ×2 IMPLANT
TROCAR XCEL UNIV SLVE 11M 100M (ENDOMECHANICALS) IMPLANT

## 2020-07-24 NOTE — Progress Notes (Addendum)
Pt was admitted on the floor with no signs of distress. Pt alert x 4 but response to voice. VSS except 103/74 MAP 84 HR 100. Pt was educated about safety. Will contineu to monitor.  Update 0350: ICU staff came to start IV on pt. 2A staff attempted multiple IV on pt but was unsuccessful. Will continue to monitor.  Update 0410: ICU staff unable to placed IV. Will consult IV team. Will continue to monitor.  Update 0657: IV team consulted. Will notify incoming shift. Will continue to monitor.

## 2020-07-24 NOTE — Progress Notes (Signed)
Mobility Specialist - Progress Note   07/24/20 1600  Mobility  Activity Refused mobility  Mobility performed by Mobility specialist    Pt politely declined mobility this date, no reason specified. Pt reports she's been ambulating to/from bathroom, but that's all the activity she's been able to tolerate d/t pain and nausea. Pt left resting in bed at this time.    Kathee Delton Mobility Specialist 07/24/20, 4:46 PM

## 2020-07-24 NOTE — Progress Notes (Addendum)
PROGRESS NOTE    Anita Newton  OEH:[id number] DOB: 12/15/1980 DOA: 07/23/2020 PCP: Steele Sizer, MD   Brief Narrative:  40 y.o. female with medical history significant of  anemia, depression, HTN     Presented with  severe chest burning  she drank baking soda with water and went to bed she was ok in Am and went to work but later on developed nausea and vomiting with abdominal pain Patient reports generalized body aches most significant pain in the lower abdomen.  No associated diarrhea. She states she has known history of ovarian cyst and have seen OB/GYN for this in the past she was reassured regarding this Advised patient to return to her OB/GYN for follow-up  Patient's had some fevers overnight and met sepsis criteria.  She was started on broad-spectrum IV antibiotics.  Continues to have significant diffuse abdominal tenderness associated with nausea and poor p.o. intake.  Given her OB/GYN history I did reach out to the gynecologist for consultation.  There is some concern for ovarian torsion though this would not explain the fevers.  Per last OB/GYN note tentative plan for diagnostic laparoscopy to investigate possible ovarian torsion.   Assessment & Plan:   Active Problems:   Hypertension, benign   Intractable nausea and vomiting   Ovarian cyst   Dehydration   Sepsis (HCC)   Right lower quadrant abdominal pain  Sepsis of unknown etiology Possible differentials include infected ovarian cyst, ovarian abscess.  Ovarian torsion may be culprit for abdominal pain and intractable nausea and vomiting. Plan: Continue IV antibiotics Monitor results of blood and urine cultures, no growth to date Fluid resuscitation per sepsis bundle OB/GYN consultation Plan for OR for diagnostic laparoscopy rule out ovarian torsion  Hypertension Blood pressure currently controlled Home agents on hold Ensure pain control Restart home agents when blood pressure dictates  Known ovarian  cyst Imaging reveals complex and septated ovarian cyst.  Per OB/GYN unclear whether this is a source of infection but unable to completely rule out.  Tentative plan for diagnostic laparoscopy pending or availability.  Intractable nausea vomiting Diffuse abdominal pain Multimodal antiemetic and pain regimen     DVT prophylaxis: SCDs Code Status: Full Family Communication: None today Disposition Plan: Status is: Inpatient  Remains inpatient appropriate because:Inpatient level of care appropriate due to severity of illness  Dispo: The patient is from: Home              Anticipated d/c is to: Home              Patient currently is not medically stable to d/c.   Difficult to place patient No       Level of care: Progressive Cardiac  Consultants:  OB/GYN  Procedures:  None  Antimicrobials:  Vancomycin Cefepime Flagyl   Subjective: Seen and examined.  Continues to endorse severe weakness, fatigue, diffuse abdominal pain  Objective: Vitals:   07/24/20 0700 07/24/20 1134 07/24/20 1517 07/24/20 1527  BP: 112/81 122/82 128/89   Pulse: 98 (!) 107 (!) 112 (!) 101  Resp: 18 18 18    Temp: 97.8 F (36.6 C) 99.3 F (37.4 C) 100.2 F (37.9 C)   TempSrc: Oral     SpO2: 98% 97% 100%   Weight:      Height:        Intake/Output Summary (Last 24 hours) at 07/24/2020 1707 Last data filed at 07/24/2020 1623 Gross per 24 hour  Intake 2065.73 ml  Output 1515 ml  Net 550.73 ml  Filed Weights   07/23/20 1048 07/23/20 2313  Weight: 89.4 kg 92.6 kg    Examination:  General exam: Mild distress due to pain Respiratory system: Clear to auscultation. Respiratory effort normal. Cardiovascular system: Tachycardic, regular rhythm, no murmurs Gastrointestinal system: Soft, nondistended, tender to palpation diffusely, normal bowel sounds Central nervous system: Alert and oriented. No focal neurological deficits. Extremities: Symmetric 5 x 5 power. Skin: No rashes, lesions or  ulcers Psychiatry: Judgement and insight appear normal. Mood & affect appropriate.     Data Reviewed: I have personally reviewed following labs and imaging studies  CBC: Recent Labs  Lab 07/23/20 1050 07/24/20 0532  WBC 9.4  9.2 5.1  NEUTROABS 8.1* 4.1  HGB 12.5  12.9 10.1*  HCT 38.3  38.0 29.9*  MCV 91.0  88.0 87.9  PLT 347  330 979   Basic Metabolic Panel: Recent Labs  Lab 07/23/20 1050 07/23/20 2020 07/24/20 0532  NA 135  --  135  K 3.9  --  2.9*  CL 102  --  106  CO2 25  --  25  GLUCOSE 111*  --  95  BUN 15  --  11  CREATININE 0.63  --  0.65  CALCIUM 9.0  --  7.9*  MG  --  1.4* 1.5*  PHOS  --  3.2 2.6   GFR: Estimated Creatinine Clearance: 94.9 mL/min (by C-G formula based on SCr of 0.65 mg/dL). Liver Function Tests: Recent Labs  Lab 07/23/20 1050 07/24/20 0532  AST 28 18  ALT 36 26  ALKPHOS 64 55  BILITOT 0.8 1.0  PROT 8.0 6.1*  ALBUMIN 4.1 3.2*   Recent Labs  Lab 07/23/20 1050  LIPASE 30   No results for input(s): AMMONIA in the last 168 hours. Coagulation Profile: No results for input(s): INR, PROTIME in the last 168 hours. Cardiac Enzymes: Recent Labs  Lab 07/23/20 2020  CKTOTAL 53   BNP (last 3 results) No results for input(s): PROBNP in the last 8760 hours. HbA1C: No results for input(s): HGBA1C in the last 72 hours. CBG: Recent Labs  Lab 07/24/20 0050  GLUCAP 91   Lipid Profile: No results for input(s): CHOL, HDL, LDLCALC, TRIG, CHOLHDL, LDLDIRECT in the last 72 hours. Thyroid Function Tests: Recent Labs    07/23/20 1629 07/24/20 0532  TSH 0.699 0.324*  FREET4 0.93  --    Anemia Panel: No results for input(s): VITAMINB12, FOLATE, FERRITIN, TIBC, IRON, RETICCTPCT in the last 72 hours. Sepsis Labs: Recent Labs  Lab 07/23/20 2020 07/23/20 2147  PROCALCITON  --  <0.10  LATICACIDVEN 1.1  --     Recent Results (from the past 240 hour(s))  Resp Panel by RT-PCR (Flu A&B, Covid) Nasopharyngeal Swab     Status: None    Collection Time: 07/23/20 11:56 AM   Specimen: Nasopharyngeal Swab; Nasopharyngeal(NP) swabs in vial transport medium  Result Value Ref Range Status   SARS Coronavirus 2 by RT PCR NEGATIVE NEGATIVE Final    Comment: (NOTE) SARS-CoV-2 target nucleic acids are NOT DETECTED.  The SARS-CoV-2 RNA is generally detectable in upper respiratory specimens during the acute phase of infection. The lowest concentration of SARS-CoV-2 viral copies this assay can detect is 138 copies/mL. A negative result does not preclude SARS-Cov-2 infection and should not be used as the sole basis for treatment or other patient management decisions. A negative result may occur with  improper specimen collection/handling, submission of specimen other than nasopharyngeal swab, presence of viral mutation(s) within the areas  targeted by this assay, and inadequate number of viral copies(<138 copies/mL). A negative result must be combined with clinical observations, patient history, and epidemiological information. The expected result is Negative.  Fact Sheet for Patients:  EntrepreneurPulse.com.au  Fact Sheet for Healthcare Providers:  IncredibleEmployment.be  This test is no t yet approved or cleared by the [place] FDA and  has been authorized for detection and/or diagnosis of SARS-CoV-2 by FDA under an Emergency Use Authorization (EUA). This EUA will remain  in effect (meaning this test can be used) for the duration of the COVID-19 declaration under Section 564(b)(1) of the Act, 21 U.S.C.section 360bbb-3(b)(1), unless the authorization is terminated  or revoked sooner.       Influenza A by PCR NEGATIVE NEGATIVE Final   Influenza B by PCR NEGATIVE NEGATIVE Final    Comment: (NOTE) The Xpert Xpress SARS-CoV-2/FLU/RSV plus assay is intended as an aid in the diagnosis of influenza from Nasopharyngeal swab specimens and should not be used as a sole basis for treatment.  Nasal washings and aspirates are unacceptable for Xpert Xpress SARS-CoV-2/FLU/RSV testing.  Fact Sheet for Patients: EntrepreneurPulse.com.au  Fact Sheet for Healthcare Providers: IncredibleEmployment.be  This test is not yet approved or cleared by the [place] FDA and has been authorized for detection and/or diagnosis of SARS-CoV-2 by FDA under an Emergency Use Authorization (EUA). This EUA will remain in effect (meaning this test can be used) for the duration of the COVID-19 declaration under Section 564(b)(1) of the Act, 21 U.S.C. section 360bbb-3(b)(1), unless the authorization is terminated or revoked.  Performed at [facility], [address] [place], [place] [address]   CULTURE, BLOOD (ROUTINE X 2) w Reflex to ID Panel     Status: None (Preliminary result)   Collection Time: 07/23/20  9:47 PM   Specimen: BLOOD  Result Value Ref Range Status   Specimen Description BLOOD BLOOD RIGHT HAND  Final   Special Requests   Final    BOTTLES DRAWN AEROBIC AND ANAEROBIC Blood Culture adequate volume   Culture   Final    NO GROWTH < 12 HOURS Performed at [facility], [address], [place], [place] [address]    Report Status PENDING  Incomplete  CULTURE, BLOOD (ROUTINE X 2) w Reflex to ID Panel     Status: None (Preliminary result)   Collection Time: 07/23/20  9:51 PM   Specimen: BLOOD  Result Value Ref Range Status   Specimen Description BLOOD BLOOD LEFT HAND  Final   Special Requests   Final    BOTTLES DRAWN AEROBIC AND ANAEROBIC Blood Culture adequate volume   Culture   Final    NO GROWTH < 12 HOURS Performed at [facility], [address] [place], [place] [address]    Report Status PENDING  Incomplete         Radiology Studies: DG Chest 2 View  Result Date: 07/23/2020 CLINICAL DATA:  Chest pain. Additional history provided: Patient reports acid reflux, vomiting, mid chest pain. EXAM: CHEST -  2 VIEW COMPARISON:  CT angiogram chest 01/29/2015. prior chest radiographs 01/28/2015 and earlier. FINDINGS: Heart size within normal limits. No appreciable airspace consolidation. No evidence of pleural effusion or pneumothorax. No acute bony abnormality identified. IMPRESSION: No evidence of active cardiopulmonary disease. Electronically Signed   By: Kellie Simmering DO   On: 07/23/2020 12:36   CT Head Wo Contrast  Result Date: 07/23/2020 CLINICAL DATA:  40 year old female with dizziness. EXAM: CT HEAD WITHOUT CONTRAST TECHNIQUE:  Contiguous axial images were obtained from the base of the skull through the vertex without intravenous contrast. COMPARISON:  Brain MRI dated 11/17/2019. FINDINGS: Evaluation of this exam is limited due to motion artifact. Brain: The ventricles and sulci are appropriate size for the patient's age. The gray-white matter discrimination is preserved. There is no acute intracranial hemorrhage. No mass effect or midline shift. No extra-axial fluid collection. Vascular: No hyperdense vessel or unexpected calcification. Skull: Normal. Negative for fracture or focal lesion. Sinuses/Orbits: No acute finding. Other: None IMPRESSION: Unremarkable noncontrast CT of the brain. Electronically Signed   By: Anner Crete M.D.   On: 07/23/2020 20:19   CT ABDOMEN PELVIS W CONTRAST  Result Date: 07/23/2020 CLINICAL DATA:  Right lower quadrant abdominal pain.  Vomiting. EXAM: CT ABDOMEN AND PELVIS WITH CONTRAST TECHNIQUE: Multidetector CT imaging of the abdomen and pelvis was performed using the standard protocol following bolus administration of intravenous contrast. CONTRAST:  146m OMNIPAQUE IOHEXOL 300 MG/ML  SOLN COMPARISON:  None. FINDINGS: Lower chest: Unremarkable. Hepatobiliary: No suspicious focal abnormality within the liver parenchyma. There is no evidence for gallstones, gallbladder wall thickening, or pericholecystic fluid. No intrahepatic or extrahepatic biliary dilation. Pancreas: No  focal mass lesion. No dilatation of the main duct. No intraparenchymal cyst. No peripancreatic edema. Spleen: No splenomegaly. No focal mass lesion. Adrenals/Urinary Tract: No adrenal nodule or mass. Right kidney unremarkable. 9 x 5 mm nonobstructing stone identified interpolar left kidney with an additional 4 mm stone in the upper pole. Tiny hypodensity lower pole left kidney is too small to characterize but likely a benign cyst. No evidence for hydroureter. The urinary bladder appears normal for the degree of distention. Stomach/Bowel: Stomach is unremarkable. No gastric wall thickening. No evidence of outlet obstruction. Duodenum is normally positioned as is the ligament of Treitz. No small bowel wall thickening. No small bowel dilatation. The terminal ileum is normal. The appendix is normal. No gross colonic mass. No colonic wall thickening. Vascular/Lymphatic: No abdominal aortic aneurysm. No abdominal lymphadenopathy No pelvic sidewall lymphadenopathy. Reproductive: Uterus surgically absent. 3.8 x 3.2 cm complex cystic lesion identified posterior right adnexal space. Other: No intraperitoneal free fluid. Musculoskeletal: No worrisome lytic or sclerotic osseous abnormality. IMPRESSION: 1. 3.8 x 3.2 cm complex cystic lesion posterior right adnexal space. Given the history of right lower quadrant pain, pelvic ultrasound recommended to further evaluate. 2. Nonobstructing left renal stones. Electronically Signed   By: EMisty StanleyM.D.   On: 07/23/2020 13:29   [place] COMPLETE W TRANSVAGINAL AND TORSION R/O  Result Date: 07/23/2020 CLINICAL DATA:  40year old female with lower abdominal pain. EXAM: TRANSABDOMINAL AND TRANSVAGINAL ULTRASOUND OF PELVIS DOPPLER ULTRASOUND OF OVARIES TECHNIQUE: Both transabdominal and transvaginal ultrasound examinations of the pelvis were performed. Transabdominal technique was performed for global imaging of the pelvis including uterus, ovaries, adnexal regions, and pelvic  cul-de-sac. It was necessary to proceed with endovaginal exam following the transabdominal exam to visualize the ovaries. Color and duplex Doppler ultrasound was utilized to evaluate blood flow to the ovaries. COMPARISON:  CT of the pelvis dated 07/25/2020. FINDINGS: Uterus Hysterectomy. Endometrium Hysterectomy. Right ovary Measurements: 4.8 x 2.7 x 3.4 cm = volume: 23.4 mL. There is a 3.2 x 2.1 x 2.9 cm septated/complex cyst or 2 adjacent cysts in the right ovary versus paraovarian cyst. Left ovary Measurements: 2.9 x 2.3 x 1.9 cm = volume: 6.6 mL. Normal appearance/no adnexal mass. Pulsed Doppler evaluation of both ovaries demonstrates normal low-resistance arterial and venous waveforms. Other findings No abnormal  free fluid. IMPRESSION: 1. Complex/septated right ovarian or paraovarian cysts. 2. Hysterectomy. Electronically Signed   By: Anner Crete M.D.   On: 07/23/2020 18:54        Scheduled Meds:  [START ON 07/25/2020] dicyclomine  20 mg Oral TID AC   metoCLOPramide (REGLAN) injection  10 mg Intravenous Q8H   pantoprazole  40 mg Oral Daily   Continuous Infusions:  sodium chloride 75 mL/hr at 07/24/20 1315   ceFEPime (MAXIPIME) IV 2 g (07/24/20 1102)   metronidazole 500 mg (07/24/20 0630)   potassium chloride     promethazine (PHENERGAN) injection (IM or IVPB) Stopped (07/24/20 0424)     LOS: 0 days    Time spent: 25 minutes    Sidney Ace, MD [facility] Pager [phone]  If 7PM-7AM, please contact night-coverage 07/24/2020, 5:07 PM
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
## 2020-07-24 NOTE — Progress Notes (Signed)
Surgery complete: - No evidence of ovarian torsion or other intra-abdominal pathology (see operative report) - peritoneal fluid collected for culture - peritoneal fluid also sent for cytology due to complex right ovarian cyst  Gyn to follow for post-op management Will defer to Primary team for active problems.   Prentice Docker, MD, [facility], [facility] 07/24/2020 10:46 PM

## 2020-07-24 NOTE — Op Note (Addendum)
  Operative Note    Name: Anita Newton  Date of Service: 07/24/2020  DOB: [birth year]  MRN: [id number]   Pre-Operative Diagnosis:  1) Right lower quadrant abdominal pain 2) Right ovarian cysts  Post-Operative Diagnosis:  1) Right lower quadrant abdominal pain 2) Right ovarian cysts  Procedures:  1. Lysis of adhesions 2. Diagnostic laparoscopy  Primary Surgeon: Prentice Docker, MD   Assistant Surgeon: Deirdre Peer, PA-S  EBL: 5 mL   IVF: 900 mL   Urine output: 1,000 mL  Specimens:  1) Peritoneal fluid for culture 2) Peritoneal fluid for cytology  Drains: none  Complications: None   Disposition: PACU   Condition: Stable   Findings:  1) Adhesions of epiploica and omentum to right pelvic sidewall and pelvic floor. 2) Mildly enlarged right ovary with no evidence of torsion.  3) left ovary appears normal 4) normal-appearing appendix 5) normal appearing liver 6) no visual evidence of purulence or infection in abdominal cavity  Procedure Summary:   The patient was taken to the operating room where general anesthesia was administered and found to be adequate. She was placed in the dorsal supine lithotomy position in [place] stirrups and prepped and draped in usual sterile fashion. After a timeout was called an indwelling catheter was placed in her bladder.   Attention was turned to the abdomen where after injection of local anesthetic, a 5 mm infraumbilical incision was made with the scalpel. Entry into the abdomen was attempted, but unsuccessful via Optiview trocar technique (a blunt entry technique with camera visualization through the obturator upon entry).  So, a Palmer's point entry was made using the same technique with success.  Verification of entry into the abdomen was obtained using opening pressures. The abdomen was insufflated with CO2. The camera was introduced through the trocar with verification of atraumatic entry.  The area below the attempted  umbilical placement was visualized without apparent injury.  With direct, intra-abdominal visualization, a 5 mm umbilical trocar was placed without difficulty.  A 75mm suprapubic port site was created under direct intra-abdominal visualization without difficulty.  A survey of the pelvis was undertaken with the above-noted findings. In order to visualize the right ovary, lysis of the above-mentioned adhesions had to be undertaken.  Prior to this, fluid in the pelvis was collected for cytology and for culture.  The fluid appeared serous.  The right ovary was visualized and appeared normal. I was able to lyse more adhesion and visualize the appendix, which appeared normal.  The liver was also visualized and appeared normal.    With no evident pathology or infection, the procedure was terminated.  The abdomen was desufflated of CO2 with the help of five deep breaths from anesthesia.  The trocars were removed without difficulty.  The subcutaneous layers were re-approximated using 4-0 monocryl.  The skin at all incision sites was closed using surgical skin glue.    The foley catheter was removed without difficulty.    The patient tolerated the procedure well.  Sponge, lap, needle, and instrument counts were correct x 2.  VTE prophylaxis: SCDs. Antibiotic prophylaxis: none that was in addition to the antibiotics she was already taking. She was awakened in the operating room and was taken to the PACU in stable condition.   Prentice Docker, MD 07/24/2020 10:20 PM

## 2020-07-24 NOTE — Plan of Care (Signed)
  Problem: Fluid Volume: Goal: Hemodynamic stability will improve Outcome: Progressing   Problem: Clinical Measurements: Goal: Signs and symptoms of infection will decrease Outcome: Progressing   Problem: Clinical Measurements: Goal: Ability to maintain clinical measurements within normal limits will improve Outcome: Progressing

## 2020-07-24 NOTE — Anesthesia Procedure Notes (Signed)
Procedure Name: Intubation Date/Time: 07/24/2020 9:13 PM Performed by: Lendon Colonel, CRNA Pre-anesthesia Checklist: Patient identified, Patient being monitored, Timeout performed, Emergency Drugs available and Suction available Patient Re-evaluated:Patient Re-evaluated prior to induction Oxygen Delivery Method: Circle system utilized Preoxygenation: Pre-oxygenation with 100% oxygen Induction Type: IV induction, Rapid sequence and Cricoid Pressure applied Laryngoscope Size: 3 and McGraph Grade View: Grade I Tube type: Oral Tube size: 7.0 mm Number of attempts: 1 Airway Equipment and Method: Stylet Placement Confirmation: ETT inserted through vocal cords under direct vision, positive ETCO2 and breath sounds checked- equal and bilateral Secured at: 21 cm Tube secured with: Tape Dental Injury: Teeth and Oropharynx as per pre-operative assessment

## 2020-07-24 NOTE — Progress Notes (Signed)
   07/24/20 0327  Assess: MEWS Score  Temp (!) 100.5 F (38.1 C)  BP 123/87  ECG Heart Rate 100  Resp 20  SpO2 100 %  O2 Device Room Air  Assess: MEWS Score  MEWS Temp 1  MEWS Systolic 0  MEWS Pulse 0  MEWS RR 0  MEWS LOC 1  MEWS Score 2  MEWS Score Color Yellow  Assess: if the MEWS score is Yellow or Red  Were vital signs taken at a resting state? No  Focused Assessment No change from prior assessment  Early Detection of Sepsis Score *See Row Information* High  MEWS guidelines implemented *See Row Information* No, previously yellow, continue vital signs every 4 hours  Treat  MEWS Interventions Administered prn meds/treatments;Administered scheduled meds/treatments  Pain Scale 0-10  Pain Score 0  Take Vital Signs  Increase Vital Sign Frequency  Yellow: Q 2hr X 2 then Q 4hr X 2, if remains yellow, continue Q 4hrs  Notify: Charge Nurse/RN  Name of Charge Nurse/RN Notified Ena Dawley, RN  Date Charge Nurse/RN Notified 07/24/20  Time Charge Nurse/RN Notified 0327

## 2020-07-24 NOTE — Transfer of Care (Signed)
Immediate Anesthesia Transfer of Care Note  Patient: [name]  Procedure(s) Performed: Diagnostic laparoscopy, lysis of adhesions (Abdomen)  Patient Location: PACU  Anesthesia Type:General  Level of Consciousness: sedated  Airway & Oxygen Therapy: Patient Spontanous Breathing and Patient connected to face mask oxygen  Post-op Assessment: Report given to RN and Post -op Vital signs reviewed and stable  Post vital signs: Reviewed and stable  Last Vitals:  Vitals Value Taken Time  BP    Temp    Pulse 98 07/24/20 2239  Resp 12 07/24/20 2239  SpO2 99 % 07/24/20 2239  Vitals shown include unvalidated device data.  Last Pain:  Vitals:   07/24/20 2000  TempSrc: Oral  PainSc:          Complications: No notable events documented.

## 2020-07-25 ENCOUNTER — Encounter: Payer: Self-pay | Admitting: Internal Medicine

## 2020-07-25 LAB — BASIC METABOLIC PANEL
Anion gap: 9 (ref 5–15)
BUN: 6 mg/dL (ref 6–20)
CO2: 25 mmol/L (ref 22–32)
Calcium: 8.4 mg/dL — ABNORMAL LOW (ref 8.9–10.3)
Chloride: 105 mmol/L (ref 98–111)
Creatinine, Ser: 0.63 mg/dL (ref 0.44–1.00)
GFR, Estimated: >60 mL/min (ref >60)
Glucose, Bld: 143 mg/dL — ABNORMAL HIGH (ref 70–99)
Potassium: 3.8 mmol/L (ref 3.5–5.1)
Sodium: 139 mmol/L (ref 135–145)

## 2020-07-25 LAB — CBC WITH DIFFERENTIAL/PLATELET
Abs Immature Granulocytes: 0.02 10*3/uL (ref 0.00–0.07)
Basophils Absolute: 0 10*3/uL (ref 0.0–0.1)
Basophils Relative: 0 %
Eosinophils Absolute: 0 10*3/uL (ref 0.0–0.5)
Eosinophils Relative: 0 %
HCT: 30.2 % — ABNORMAL LOW (ref 36.0–46.0)
Hemoglobin: 10.3 g/dL — ABNORMAL LOW (ref 12.0–15.0)
Immature Granulocytes: 0 %
Lymphocytes Relative: 10 %
Lymphs Abs: 0.7 10*3/uL (ref 0.7–4.0)
MCH: 29.4 pg (ref 26.0–34.0)
MCHC: 34.1 g/dL (ref 30.0–36.0)
MCV: 86.3 fL (ref 80.0–100.0)
Monocytes Absolute: 0.3 10*3/uL (ref 0.1–1.0)
Monocytes Relative: 5 %
Neutro Abs: 5.6 10*3/uL (ref 1.7–7.7)
Neutrophils Relative %: 85 %
Platelets: 288 10*3/uL (ref 150–400)
RBC: 3.5 MIL/uL — ABNORMAL LOW (ref 3.87–5.11)
RDW: 12.1 % (ref 11.5–15.5)
WBC: 6.6 10*3/uL (ref 4.0–10.5)
nRBC: 0 % (ref 0.0–0.2)

## 2020-07-25 LAB — MAGNESIUM: Magnesium: 1.9 mg/dL (ref 1.7–2.4)

## 2020-07-25 MED ORDER — MENTHOL 3 MG MT LOZG
1.0000 | LOZENGE | OROMUCOSAL | Status: DC | PRN
  Filled 2020-07-25: qty 9

## 2020-07-25 MED ORDER — HYDROMORPHONE HCL 1 MG/ML IJ SOLN: 0.5000 mg | INTRAMUSCULAR | Status: DC | PRN

## 2020-07-25 MED ORDER — OXYCODONE HCL 5 MG PO TABS
10.0000 mg | ORAL_TABLET | Freq: Four times a day (QID) | ORAL | Status: DC | PRN
  Administered 2020-07-25 – 2020-07-26 (×6): 10 mg via ORAL
  Filled 2020-07-25 (×6): qty 2

## 2020-07-25 MED ORDER — OXYCODONE HCL 5 MG PO TABS
5.0000 mg | ORAL_TABLET | Freq: Four times a day (QID) | ORAL | Status: DC | PRN
  Administered 2020-07-25 – 2020-07-27 (×2): 5 mg via ORAL
  Filled 2020-07-25 (×2): qty 1

## 2020-07-25 MED ORDER — PANTOPRAZOLE SODIUM 40 MG PO TBEC
40.0000 mg | DELAYED_RELEASE_TABLET | Freq: Two times a day (BID) | ORAL | Status: DC
  Administered 2020-07-25 – 2020-07-28 (×6): 40 mg via ORAL
  Filled 2020-07-25 (×6): qty 1

## 2020-07-25 MED ORDER — SIMETHICONE 80 MG PO CHEW
80.0000 mg | CHEWABLE_TABLET | Freq: Four times a day (QID) | ORAL | Status: DC
  Administered 2020-07-25 – 2020-07-28 (×13): 80 mg via ORAL
  Filled 2020-07-25 (×16): qty 1

## 2020-07-25 MED ORDER — SIMETHICONE 40 MG/0.6ML PO SUSP
80.0000 mg | Freq: Once | ORAL | Status: DC
  Filled 2020-07-25 (×2): qty 1.2

## 2020-07-25 NOTE — Anesthesia Postprocedure Evaluation (Signed)
Anesthesia Post Note  Patient: Anita Newton  Procedure(s) Performed: Diagnostic laparoscopy, lysis of adhesions (Abdomen)  Patient location during evaluation: PACU Anesthesia Type: General Level of consciousness: awake and alert Pain management: pain level controlled Vital Signs Assessment: post-procedure vital signs reviewed and stable Respiratory status: spontaneous breathing, nonlabored ventilation, respiratory function stable and patient connected to nasal cannula oxygen Cardiovascular status: blood pressure returned to baseline and stable Postop Assessment: no apparent nausea or vomiting Anesthetic complications: no   No notable events documented.   Last Vitals:  Vitals:   07/24/20 2330 07/24/20 2346  BP: 106/75 106/70  Pulse: 94 89  Resp: 20 15  Temp:  37 C  SpO2: 95% 93%    Last Pain:  Vitals:   07/24/20 2330  TempSrc:   PainSc: 7                  Martha Clan

## 2020-07-25 NOTE — Progress Notes (Signed)
PT Cancellation Note  Patient Details Name: Anita Newton MRN: [id number] DOB: 1980-08-29   Cancelled Treatment:    Reason Eval/Treat Not Completed: Patient declined, no reason specified. Patient reports she is not up for walking today, maybe tomorrow. Will re-attempt tomorrow.    Aadit Hagood 07/25/2020, 2:33 PM

## 2020-07-25 NOTE — Progress Notes (Signed)
Daily Benign Gynecology Progress Note Anita Newton  [id number]  HD#3/POD#1  Subjective:  Overnight Events: no acute events Complaints: some midline lower abdominal pain over suprapubic incision (7/10), RLQ pain improved. Episode of emesis around midnight.  States she has held down PO pain meds and some liquids and possibly crackers. She denies: fevers, chills, chest pain, trouble breathing, current nausea, severe abdominal pain.  She has tolerated: clear liquids She reports her pain is well controlled with po pain meds.   She is ambulating and is voiding.  She has passed a small amount of flatus.   Objective:  Most recent vitals Temp: 97.9 F (36.6 C)  BP: 114/77  Pulse Rate: 88  Resp: 16  SpO2: 96 %   Vitals Range over 24 hours Temp  Avg: 98.7 F (37.1 C)  Min: 97.7 F (36.5 C)  Max: 100.2 F (37.9 C) BP  Min: 106/70  Max: 128/89 Pulse  Avg: 97.3  Min: 88  Max: 112 SpO2  Avg: 96.5 %  Min: 93 %  Max: 100 %   Urine Output: 515 mL over 4 hours.    Physical Exam Constitutional:      General: She is not in acute distress.    Appearance: Normal appearance.  HENT:     Head: Normocephalic and atraumatic.  Eyes:     General: No scleral icterus.    Conjunctiva/sclera: Conjunctivae normal.  Cardiovascular:     Rate and Rhythm: Normal rate and regular rhythm.     Heart sounds: No murmur heard.   No friction rub. No gallop.  Pulmonary:     Effort: Pulmonary effort is normal. No respiratory distress.     Breath sounds: Normal breath sounds. No wheezing or rales.  Abdominal:     General: There is no distension.     Palpations: Abdomen is soft.     Tenderness: There is no abdominal tenderness. There is no guarding or rebound.     Comments: Incisions: clean/dry/intact  Musculoskeletal:        General: No swelling. Normal range of motion.  Neurological:     General: No focal deficit present.     Mental Status: She is alert and oriented to person, place, and time.     Cranial  Nerves: No cranial nerve deficit.  Skin:    General: Skin is warm and dry.     Findings: No lesion.  Psychiatric:        Mood and Affect: Mood normal.        Behavior: Behavior normal.        Judgment: Judgment normal.    AM Labs Lab Results  Component Value Date   WBC 5.1 07/24/2020   HGB 10.1 (L) 07/24/2020   HCT 29.9 (L) 07/24/2020   PLT 270 07/24/2020   NA 139 07/25/2020   K 3.8 07/25/2020   CREATININE 0.63 07/25/2020   BUN 6 07/25/2020     Assessment:  Anita Newton is a 40 y.o. female HD#3/POD#1 s/p lysis of adhesions and diagnostic laparoscopy.    Plan:  Pain Control:  continue PO pain meds as ordered Add simethicone for gas. From a gyn standpoint no issues. May call or page with post-op questions, as her surgery is generally done as outpatient.  Prentice Docker, MD  07/25/2020 7:39 AM

## 2020-07-25 NOTE — Evaluation (Addendum)
Occupational Therapy Evaluation Patient Details Name: CHARLESIA CANADAY MRN: [id number] DOB: 26-May-1980 Today's Date: 07/25/2020    History of Present Illness Pt. is a 40 y.o. female  who was admitted to [facility] with Sepsis, and Right lower quadrant pain. Pt. undewent Diagnostic Laparoscopy, and Lysis of lesions. Pt. PMX includes: Ovarian Cysts, recent laparoscopy hysterectomy s/p 8 months with salpingectomy cystoscpy (s/p 8 months ago), Anemia, Depression, and HTN.   Clinical Impression   Pt. Presents with weakness, limited activity tolerance, and limited functional mobility which limits her ability to complete basic ADL and IADL functioning.  Pt. Resides at home alone, however has a daughter who comes and goes throughout the day/night. Pt. Was independent with ADLs, and IADL functioning: including meal preparation, and medication management. 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 Pt. requires assist with LE ADLs secondary to abdominal discomfort. Pt education was provided about A/E use for LE ADLs. Pt. has a reacher, long handled shoehorn, and long handled back brush at home. Pt. reports having learned ways of doing things that were helpful to her when she was limited following hysterectomy surgery 8 months ago. Pt. is interested in inquiring about obtaining a handicapped parking tag. Continued OT services are not warranted at this time. Pt. is in agreement, will complete the order.    Follow Up Recommendations  No OT follow up    Equipment Recommendations      None  Recommended  Recommendations for Other Services       Precautions / Restrictions Restrictions Weight Bearing Restrictions: No      Mobility Bed Mobility Overal bed mobility: Independent                  Transfers Overall transfer level: Needs assistance   Transfers: Sit to/from Stand Sit to Stand: Min guard              Balance                                            ADL either performed or assessed with clinical judgement   ADL Overall ADL's : Needs assistance/impaired Eating/Feeding: Set up;Independent   Grooming: Set up;Independent   Upper Body Bathing: Independent   Lower Body Bathing: Minimal assistance   Upper Body Dressing : Set up;Independent   Lower Body Dressing: Minimal assistance   Toilet Transfer: Min guard           Functional mobility during ADLs: Min guard General ADL Comments: Pt. reports having a reacher, Long handled bath brush, and long handle shoehorn at home.     Vision Baseline Vision/History: No visual deficits;Wears glasses Wears Glasses: Reading only       Perception     Praxis      Pertinent Vitals/Pain Pain Assessment: 0-10 Pain Score: 8  Pain Location: Abdomen Pain Descriptors / Indicators: Aching Pain Intervention(s): Limited activity within patient's tolerance;Monitored during session     Hand Dominance Right   Extremity/Trunk Assessment Upper Extremity Assessment Upper Extremity Assessment: Overall WFL for tasks assessed           Communication Communication Communication: No difficulties   Cognition Arousal/Alertness: Awake/alert Behavior During Therapy: WFL for tasks assessed/performed Overall Cognitive Status: Within Functional Limits for tasks assessed  General Comments       Exercises     Shoulder Instructions      Home Living Family/patient expects to be discharged to:: Private residence Living Arrangements: Children   Type of Home: House Home Access: Stairs to enter [employer] of Steps: 5 Entrance Stairs-Rails: Right;Left Home Layout: Two level;Able to live on main level with bedroom/bathroom     Bathroom Shower/Tub: Tub/shower unit;Curtain, curtain   Bathroom Toilet: Standard     Home Equipment: Environmental consultant - 2 wheels;Bedside commode          Prior Functioning/Environment Level of  Independence: Independent        Comments: pt reports being active (plays basketball; works in [employer])        OT Problem List: Decreased strength;Pain;Impaired UE functional use;Decreased activity tolerance      OT Treatment/Interventions:      OT Goals(Current goals can be found in the care plan section) Acute Rehab OT Goals Patient Stated Goal: To decrease pain, and obtain a parking tag. OT Goal Formulation: With patient Potential to Achieve Goals: Good  OT Frequency: Other (comment)   Barriers to D/C:            Co-evaluation              AM-PAC OT "6 Clicks" Daily Activity     Outcome Measure Help from another person eating meals?: None Help from another person taking care of personal grooming?: None Help from another person toileting, which includes using toliet, bedpan, or urinal?: A Little Help from another person bathing (including washing, rinsing, drying)?: A Little Help from another person to put on and taking off regular upper body clothing?: None Help from another person to put on and taking off regular lower body clothing?: A Little 6 Click Score: 21   End of Session    Activity Tolerance: Patient tolerated treatment well Patient left: in bed;with call bell/phone within reach  OT Visit Diagnosis: Muscle weakness (generalized) (M62.81)                Time: 1000-1025 OT Time Calculation (min): 25 min Charges:  OT General Charges $OT Visit: 1 Visit OT Evaluation $OT Eval Low Complexity: 1 Low  Harrel Carina, MS, OTR/L   Harrel Carina 07/25/2020, 11:29 AM

## 2020-07-25 NOTE — Progress Notes (Signed)
PROGRESS NOTE    Anita Newton  ZOX:[id number] DOB: 1980-12-08 DOA: 07/23/2020 PCP: Steele Sizer, MD   Brief Narrative:  40 y.o. female with medical history significant of  anemia, depression, HTN     Presented with  severe chest burning  she drank baking soda with water and went to bed she was ok in Am and went to work but later on developed nausea and vomiting with abdominal pain Patient reports generalized body aches most significant pain in the lower abdomen.  No associated diarrhea. She states she has known history of ovarian cyst and have seen OB/GYN for this in the past she was reassured regarding this Advised patient to return to her OB/GYN for follow-up  Patient's had some fevers overnight and met sepsis criteria.  She was started on broad-spectrum IV antibiotics.  Continues to have significant diffuse abdominal tenderness associated with nausea and poor p.o. intake.  Given her OB/GYN history I did reach out to the gynecologist for consultation.  There is some concern for ovarian torsion though this would not explain the fevers.    Patient underwent diagnostic laparoscopy with Dr. Glennon Mac of OB/GYN on 6/14.  Findings negative for ovarian torsion, no evidence of intra-abdominal abscess or other clear intra-abdominal pathology that could explain patient's symptoms.  Assessment & Plan:   Active Problems:   Hypertension, benign   Intractable nausea and vomiting   Ovarian cyst   Dehydration   Sepsis ([place])   RLQ abdominal pain  Sepsis of unknown etiology Possible differentials include infected ovarian cyst, ovarian abscess.   Status post diagnostic laparoscopy 6/14 Ovarian cysts infection and ovarian abscess have been ruled out Ovarian torsion is also been ruled No clear intra-abdominal pathology that can explain this patient's symptoms At this point I suspect a viral gastroenteritis Plan: Discontinue IV antibiotics.  No clear evidence of bacterial infection.  Negative  procalcitonin.  All cultures no growth to date.  We will continue fluid resuscitation for today.  Possible discharge in 24 hours if symptoms improved.  Hypertension Blood pressure currently controlled Home agents on hold Ensure pain control Restart home agents when blood pressure dictates  Known ovarian cyst Imaging reveals complex and septated ovarian cyst.  Status post diagnostic laparoscopy 6/14.  No evidence of infection or abscess.  Negative for ovarian torsion.  Intractable nausea vomiting Diffuse abdominal pain Unclear etiology but seems to be improving over interval.  Suspect viral gastroenteritis as cause.  No indication for antibiotics.  Multimodal antiemetic and pain regimen.  Advance diet slowly as tolerated.     DVT prophylaxis: SCDs Code Status: Full Family Communication: None today Disposition Plan: Status is: Inpatient  Remains inpatient appropriate because:Inpatient level of care appropriate due to severity of illness  Dispo: The patient is from: Home              Anticipated d/c is to: Home              Patient currently is not medically stable to d/c.   Difficult to place patient No  Possible discharge in 24 hours if symptoms improved.     Level of care: Progressive Cardiac  Consultants:  OB/GYN  Procedures:  None  Antimicrobials:  Vancomycin Cefepime Flagyl   Subjective: Patient seen and examined.  Seems to be tolerating clear liquids.  Symptoms of weakness and fatigue are slowly improving.  Objective: Vitals:   07/24/20 2346 07/25/20 0409 07/25/20 0739 07/25/20 1132  BP: 106/70 114/77 119/81 107/80  Pulse: 89 88 88  86  Resp: 15 16 16  (!) 22  Temp: 98.6 F (37 C) 97.9 F (36.6 C) 98.1 F (36.7 C) 98 F (36.7 C)  TempSrc:   Oral Oral  SpO2: 93% 96% 98% 97%  Weight:      Height:        Intake/Output Summary (Last 24 hours) at 07/25/2020 1453 Last data filed at 07/25/2020 1412 Gross per 24 hour  Intake 4700.27 ml  Output 4305 ml   Net 395.27 ml   Filed Weights   07/23/20 1048 07/23/20 2313  Weight: 89.4 kg 92.6 kg    Examination:  General exam: no acute distress.  Appears fatigued Respiratory system: Clear to auscultation. Respiratory effort normal. Cardiovascular system: Regular rate and rhythm, no murmurs Gastrointestinal system: Soft, nondistended, tender to palpation near surgical incision site.  Normal bowel sounds Central nervous system: Alert and oriented. No focal neurological deficits. Extremities: Symmetric 5 x 5 power. Skin: No rashes, lesions or ulcers Psychiatry: Judgement and insight appear normal. Mood & affect appropriate.     Data Reviewed: I have personally reviewed following labs and imaging studies  CBC: Recent Labs  Lab 07/23/20 1050 07/24/20 0532 07/25/20 0632  WBC 9.4  9.2 5.1 6.6  NEUTROABS 8.1* 4.1 5.6  HGB 12.5  12.9 10.1* 10.3*  HCT 38.3  38.0 29.9* 30.2*  MCV 91.0  88.0 87.9 86.3  PLT 347  330 270 720   Basic Metabolic Panel: Recent Labs  Lab 07/23/20 1050 07/23/20 2020 07/24/20 0532 07/24/20 1948 07/25/20 0632  NA 135  --  135  --  139  K 3.9  --  2.9* 3.2* 3.8  CL 102  --  106  --  105  CO2 25  --  25  --  25  GLUCOSE 111*  --  95  --  143*  BUN 15  --  11  --  6  CREATININE 0.63  --  0.65  --  0.63  CALCIUM 9.0  --  7.9*  --  8.4*  MG  --  1.4* 1.5*  --  1.9  PHOS  --  3.2 2.6  --   --    GFR: Estimated Creatinine Clearance: 94.9 mL/min (by C-G formula based on SCr of 0.63 mg/dL). Liver Function Tests: Recent Labs  Lab 07/23/20 1050 07/24/20 0532  AST 28 18  ALT 36 26  ALKPHOS 64 55  BILITOT 0.8 1.0  PROT 8.0 6.1*  ALBUMIN 4.1 3.2*   Recent Labs  Lab 07/23/20 1050  LIPASE 30   No results for input(s): AMMONIA in the last 168 hours. Coagulation Profile: No results for input(s): INR, PROTIME in the last 168 hours. Cardiac Enzymes: Recent Labs  Lab 07/23/20 2020  CKTOTAL 53   BNP (last 3 results) No results for input(s):  PROBNP in the last 8760 hours. HbA1C: No results for input(s): HGBA1C in the last 72 hours. CBG: Recent Labs  Lab 07/24/20 0050  GLUCAP 91   Lipid Profile: No results for input(s): CHOL, HDL, LDLCALC, TRIG, CHOLHDL, LDLDIRECT in the last 72 hours. Thyroid Function Tests: Recent Labs    07/23/20 1629 07/24/20 0532  TSH 0.699 0.324*  FREET4 0.93  --    Anemia Panel: No results for input(s): VITAMINB12, FOLATE, FERRITIN, TIBC, IRON, RETICCTPCT in the last 72 hours. Sepsis Labs: Recent Labs  Lab 07/23/20 2020 07/23/20 2147  PROCALCITON  --  <0.10  LATICACIDVEN 1.1  --     Recent Results (from the past 240  hour(s))  Resp Panel by RT-PCR (Flu A&B, Covid) Nasopharyngeal Swab     Status: None   Collection Time: 07/23/20 11:56 AM   Specimen: Nasopharyngeal Swab; Nasopharyngeal(NP) swabs in vial transport medium  Result Value Ref Range Status   SARS Coronavirus 2 by RT PCR NEGATIVE NEGATIVE Final    Comment: (NOTE) SARS-CoV-2 target nucleic acids are NOT DETECTED.  The SARS-CoV-2 RNA is generally detectable in upper respiratory specimens during the acute phase of infection. The lowest concentration of SARS-CoV-2 viral copies this assay can detect is 138 copies/mL. A negative result does not preclude SARS-Cov-2 infection and should not be used as the sole basis for treatment or other patient management decisions. A negative result may occur with  improper specimen collection/handling, submission of specimen other than nasopharyngeal swab, presence of viral mutation(s) within the areas targeted by this assay, and inadequate number of viral copies(<138 copies/mL). A negative result must be combined with clinical observations, patient history, and epidemiological information. The expected result is Negative.  Fact Sheet for Patients:  EntrepreneurPulse.com.au  Fact Sheet for Healthcare Providers:  IncredibleEmployment.be  This test is no  t yet approved or cleared by the [place] FDA and  has been authorized for detection and/or diagnosis of SARS-CoV-2 by FDA under an Emergency Use Authorization (EUA). This EUA will remain  in effect (meaning this test can be used) for the duration of the COVID-19 declaration under Section 564(b)(1) of the Act, 21 U.S.C.section 360bbb-3(b)(1), unless the authorization is terminated  or revoked sooner.       Influenza A by PCR NEGATIVE NEGATIVE Final   Influenza B by PCR NEGATIVE NEGATIVE Final    Comment: (NOTE) The Xpert Xpress SARS-CoV-2/FLU/RSV plus assay is intended as an aid in the diagnosis of influenza from Nasopharyngeal swab specimens and should not be used as a sole basis for treatment. Nasal washings and aspirates are unacceptable for Xpert Xpress SARS-CoV-2/FLU/RSV testing.  Fact Sheet for Patients: EntrepreneurPulse.com.au  Fact Sheet for Healthcare Providers: IncredibleEmployment.be  This test is not yet approved or cleared by the [place] FDA and has been authorized for detection and/or diagnosis of SARS-CoV-2 by FDA under an Emergency Use Authorization (EUA). This EUA will remain in effect (meaning this test can be used) for the duration of the COVID-19 declaration under Section 564(b)(1) of the Act, 21 U.S.C. section 360bbb-3(b)(1), unless the authorization is terminated or revoked.  Performed at [facility], [address] [place], [place] [address]   CULTURE, BLOOD (ROUTINE X 2) w Reflex to ID Panel     Status: None (Preliminary result)   Collection Time: 07/23/20  9:47 PM   Specimen: BLOOD  Result Value Ref Range Status   Specimen Description BLOOD BLOOD RIGHT HAND  Final   Special Requests   Final    BOTTLES DRAWN AEROBIC AND ANAEROBIC Blood Culture adequate volume   Culture   Final    NO GROWTH 2 DAYS Performed at [facility], [address], [place], [place] [address]    Report  Status PENDING  Incomplete  CULTURE, BLOOD (ROUTINE X 2) w Reflex to ID Panel     Status: None (Preliminary result)   Collection Time: 07/23/20  9:51 PM   Specimen: BLOOD  Result Value Ref Range Status   Specimen Description BLOOD BLOOD LEFT HAND  Final   Special Requests   Final    BOTTLES DRAWN AEROBIC AND ANAEROBIC Blood Culture adequate volume   Culture   Final  NO GROWTH 2 DAYS Performed at [facility], [address] [place], [place] [address]    Report Status PENDING  Incomplete  Aerobic/Anaerobic Culture w Gram Stain (surgical/deep wound)     Status: None (Preliminary result)   Collection Time: 07/24/20  9:56 PM   Specimen: PATH Other; Wound  Result Value Ref Range Status   Specimen Description PERITONEAL FLUID  Final   Special Requests   Final    CUP PATIENT ON FOLLOWING CEFIPIME,FLAGYL,VANCOMYCIN   Gram Stain   Final    RARE WBC PRESENT, PREDOMINANTLY MONONUCLEAR NO ORGANISMS SEEN Performed at [facility], [address], [place], [place] [address]    Culture PENDING  Incomplete   Report Status PENDING  Incomplete         Radiology Studies: CT Head Wo Contrast  Result Date: 07/23/2020 CLINICAL DATA:  40 year old female with dizziness. EXAM: CT HEAD WITHOUT CONTRAST TECHNIQUE: Contiguous axial images were obtained from the base of the skull through the vertex without intravenous contrast. COMPARISON:  Brain MRI dated 11/17/2019. FINDINGS: Evaluation of this exam is limited due to motion artifact. Brain: The ventricles and sulci are appropriate size for the patient's age. The gray-white matter discrimination is preserved. There is no acute intracranial hemorrhage. No mass effect or midline shift. No extra-axial fluid collection. Vascular: No hyperdense vessel or unexpected calcification. Skull: Normal. Negative for fracture or focal lesion. Sinuses/Orbits: No acute finding. Other: None IMPRESSION: Unremarkable noncontrast CT of the brain.  Electronically Signed   By: Anner Crete M.D.   On: 07/23/2020 20:19   US PELVIC COMPLETE W TRANSVAGINAL AND TORSION R/O  Result Date: 07/23/2020 CLINICAL DATA:  40 year old female with lower abdominal pain. EXAM: TRANSABDOMINAL AND TRANSVAGINAL ULTRASOUND OF PELVIS DOPPLER ULTRASOUND OF OVARIES TECHNIQUE: Both transabdominal and transvaginal ultrasound examinations of the pelvis were performed. Transabdominal technique was performed for global imaging of the pelvis including uterus, ovaries, adnexal regions, and pelvic cul-de-sac. It was necessary to proceed with endovaginal exam following the transabdominal exam to visualize the ovaries. Color and duplex Doppler ultrasound was utilized to evaluate blood flow to the ovaries. COMPARISON:  CT of the pelvis dated 07/25/2020. FINDINGS: Uterus Hysterectomy. Endometrium Hysterectomy. Right ovary Measurements: 4.8 x 2.7 x 3.4 cm = volume: 23.4 mL. There is a 3.2 x 2.1 x 2.9 cm septated/complex cyst or 2 adjacent cysts in the right ovary versus paraovarian cyst. Left ovary Measurements: 2.9 x 2.3 x 1.9 cm = volume: 6.6 mL. Normal appearance/no adnexal mass. Pulsed Doppler evaluation of both ovaries demonstrates normal low-resistance arterial and venous waveforms. Other findings No abnormal free fluid. IMPRESSION: 1. Complex/septated right ovarian or paraovarian cysts. 2. Hysterectomy. Electronically Signed   By: Anner Crete M.D.   On: 07/23/2020 18:54        Scheduled Meds:  dicyclomine  20 mg Oral TID AC   metoCLOPramide (REGLAN) injection  10 mg Intravenous Q8H   pantoprazole  40 mg Oral BID   simethicone  80 mg Oral Once   simethicone  80 mg Oral QID   Continuous Infusions:  sodium chloride 100 mL/hr at 07/25/20 1439   ceFEPime (MAXIPIME) IV Stopped (07/25/20 1100)   metronidazole Stopped (07/25/20 1017)   promethazine (PHENERGAN) injection (IM or IVPB) Stopped (07/24/20 0424)     LOS: 1 day    Time spent: 25 minutes    Sidney Ace, MD [facility] Pager [phone]  If 7PM-7AM, please contact night-coverage 07/25/2020, 2:53 PM

## 2020-07-26 NOTE — Evaluation (Signed)
Physical Therapy Evaluation Patient Details Name: RAISSA DAM MRN: [id number] DOB: 04-Feb-1981 Today's Date: 07/26/2020   History of Present Illness  Pt. is a 40 y.o. female  who was admitted to [facility] with Sepsis, and Right lower quadrant pain. Pt. undewent Diagnostic Laparoscopy, and Lysis of lesions. Pt. PMX includes: Ovarian Cysts, recent laparoscopy hysterectomy s/p 8 months with salpingectomy cystoscpy (s/p 8 months ago), Anemia, Depression, and HTN.   Clinical Impression  Patient received in bed call light on, requesting to get up to bathroom. Patient independent with bed mobility, transfers with mod independence. Ambulated into bathroom and attended to self care. Patient then ambulated 150 feet holding to IV pole for support with supervision. Labored, painful gait. Patient will continue to benefit from skilled PT while here to improve strength and activity tolerance for return home.      Follow Up Recommendations No PT follow up    Equipment Recommendations  None recommended by PT    Recommendations for Other Services       Precautions / Restrictions Precautions Precautions: Fall Restrictions Weight Bearing Restrictions: No      Mobility  Bed Mobility Overal bed mobility: Independent                  Transfers Overall transfer level: Modified independent   Transfers: Sit to/from Stand Sit to Stand: Supervision         General transfer comment: increased time and effort, no assist needed from bed or commode.  Ambulation/Gait Ambulation/Gait assistance: Supervision Gait Distance (Feet): 150 Feet Assistive device: IV Pole Gait Pattern/deviations: Step-through pattern;Decreased step length - right;Decreased step length - left;Trunk flexed Gait velocity: decr   General Gait Details: labored gait due to pain, brief rest breaks needed due to fatigue  Stairs            Wheelchair Mobility    Modified Rankin (Stroke Patients Only)        Balance Overall balance assessment: Modified Independent                                           Pertinent Vitals/Pain Pain Assessment: 0-10 Pain Score: 8  Pain Location: Abdomen Pain Descriptors / Indicators: Sore;Discomfort;Grimacing;Guarding Pain Intervention(s): Repositioned;Monitored during session    Home Living Family/patient expects to be discharged to:: Private residence Living Arrangements: Children Available Help at Discharge: Family;Available PRN/intermittently Type of Home: House Home Access: Stairs to enter Entrance Stairs-Rails: Right;Left Entrance Stairs-Number of Steps: 5 Home Layout: Two level;Able to live on main level with bedroom/bathroom Home Equipment: Gilford Rile - 2 wheels;Bedside commode      Prior Function Level of Independence: Independent         Comments: pt reports being active (plays basketball; 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, newspaper   Dominant Hand: Right    Extremity/Trunk Assessment   Upper Extremity Assessment Upper Extremity Assessment: Overall WFL for tasks assessed    Lower Extremity Assessment Lower Extremity Assessment: Generalized weakness    Cervical / Trunk Assessment Cervical / Trunk Assessment: Normal  Communication   Communication: No difficulties  Cognition Arousal/Alertness: Awake/alert Behavior During Therapy: WFL for tasks assessed/performed Overall Cognitive Status: Within Functional Limits for tasks assessed  General Comments      Exercises     Assessment/Plan    PT Assessment Patient needs continued PT services  PT Problem List Decreased mobility;Decreased activity tolerance;Pain;Decreased strength       PT Treatment Interventions Gait training;Stair training;Functional mobility training;Therapeutic activities;Patient/family education    PT Goals (Current goals can be found in the Care Plan section)  Acute Rehab PT  Goals Patient Stated Goal: to decrease pain, return home PT Goal Formulation: With patient Time For Goal Achievement: 08/02/20 Potential to Achieve Goals: Good    Frequency Min 2X/week   Barriers to discharge        Co-evaluation               AM-PAC PT "6 Clicks" Mobility  Outcome Measure Help needed turning from your back to your side while in a flat bed without using bedrails?: None Help needed moving from lying on your back to sitting on the side of a flat bed without using bedrails?: A Little Help needed moving to and from a bed to a chair (including a wheelchair)?: A Little Help needed standing up from a chair using your arms (e.g., wheelchair or bedside chair)?: None Help needed to walk in hospital room?: A Little Help needed climbing 3-5 steps with a railing? : A Little 6 Click Score: 20    End of Session   Activity Tolerance: Patient tolerated treatment well Patient left: in bed;with call bell/phone within reach;with bed alarm set;with SCD's reapplied Nurse Communication: Mobility status PT Visit Diagnosis: Muscle weakness (generalized) (M62.81);Difficulty in walking, not elsewhere classified (R26.2);Pain Pain - part of body:  (abdomen)    Time: 8182-9937 PT Time Calculation (min) (ACUTE ONLY): 26 min   Charges:   PT Evaluation $PT Eval Moderate Complexity: 1 Mod PT Treatments $Gait Training: 8-22 mins        Akya Fiorello, PT, GCS 07/26/20,11:16 AM

## 2020-07-26 NOTE — Progress Notes (Signed)
PROGRESS NOTE    Anita Newton  ELF:[id number] DOB: Feb 06, 1981 DOA: 07/23/2020 PCP: Steele Sizer, MD   Brief Narrative:  40 y.o. female with medical history significant of  anemia, depression, HTN     Presented with  severe chest burning  she drank baking soda with water and went to bed she was ok in Am and went to work but later on developed nausea and vomiting with abdominal pain Patient reports generalized body aches most significant pain in the lower abdomen.  No associated diarrhea. She states she has known history of ovarian cyst and have seen OB/GYN for this in the past she was reassured regarding this Advised patient to return to her OB/GYN for follow-up  Patient's had some fevers overnight and met sepsis criteria.  She was started on broad-spectrum IV antibiotics.  Continues to have significant diffuse abdominal tenderness associated with nausea and poor p.o. intake.  Given her OB/GYN history I did reach out to the gynecologist for consultation.  There is some concern for ovarian torsion though this would not explain the fevers.    Patient underwent diagnostic laparoscopy with Dr. Glennon Mac of OB/GYN on 6/14.  Findings negative for ovarian torsion, no evidence of intra-abdominal abscess or other clear intra-abdominal pathology that could explain patient's symptoms.  All antibiotics have been discontinued.  Patient continues to endorse symptoms of abdominal pain, nausea, poor p.o. intake, dizzy when ambulating  Assessment & Plan:   Active Problems:   Hypertension, benign   Intractable nausea and vomiting   Ovarian cyst   Dehydration   Sepsis ([place])   RLQ abdominal pain  Sepsis of unknown etiology Possible differentials include infected ovarian cyst, ovarian abscess.   Status post diagnostic laparoscopy 6/14 Ovarian cysts infection and ovarian abscess have been ruled out Ovarian torsion is also been ruled No clear intra-abdominal pathology that can explain this  patient's symptoms At this point I suspect a viral gastroenteritis Patient continues to be symptomatic complaining of abdominal pain, nausea, dizziness while attempting to ambulate.  She spends the majority of the day in bed is not motivated to exert herself Plan: No further IV antibiotics.  Negative procalcitonin.  Cultures no growth to date.  Continue IV fluid resuscitation.  Motivate patient to ambulate.  Therapy evaluations.  As needed treatment of abdominal pain and nausea.  Hypertension Blood pressure currently controlled Home agents on hold Ensure pain control Restart home agents when blood pressure dictates  Known ovarian cyst Imaging reveals complex and septated ovarian cyst.  Status post diagnostic laparoscopy 6/14.  No evidence of infection or abscess.  Negative for ovarian torsion.  Intractable nausea vomiting Diffuse abdominal pain Unclear etiology.  Abdominal pain appears to be improving now patient is endorsing periincisional pain and persistent nausea and dizziness.     DVT prophylaxis: SCDs Code Status: Full Family Communication: None today Disposition Plan: Status is: Inpatient  Remains inpatient appropriate because:Inpatient level of care appropriate due to severity of illness  Dispo: The patient is from: Home              Anticipated d/c is to: Home              Patient currently is not medically stable to d/c.   Difficult to place patient No Remains symptomatic.  Abdominal pain associated with nausea and poor p.o. intake.     Level of care: Progressive Cardiac  Consultants:  OB/GYN  Procedures:  None  Antimicrobials:  Vancomycin Cefepime Flagyl   Subjective: Patient seen  and examined.  Endorsing nausea and poor p.o. intake as well as abdominal pain near her surgical incision site  Objective: Vitals:   07/25/20 2012 07/26/20 0351 07/26/20 0729 07/26/20 1124  BP: (!) 128/93 104/72 (!) 123/92 120/88  Pulse: 99 84 83 87  Resp: 18 16 16 16    Temp: 98.4 F (36.9 C) 97.8 F (36.6 C) 97.7 F (36.5 C) 97.6 F (36.4 C)  TempSrc:      SpO2: 100% 95% 96% 98%  Weight:      Height:        Intake/Output Summary (Last 24 hours) at 07/26/2020 1415 Last data filed at 07/26/2020 1330 Gross per 24 hour  Intake 1828.13 ml  Output 1950 ml  Net -121.87 ml   Filed Weights   07/23/20 1048 07/23/20 2313  Weight: 89.4 kg 92.6 kg    Examination:  General exam: no acute distress.  Appears fatigued Respiratory system: Clear to auscultation. Respiratory effort normal. Cardiovascular system: Regular rate and rhythm, no murmurs Gastrointestinal system: Soft, nondistended, tender to palpation near surgical incision site.  Normal bowel sounds Central nervous system: Alert and oriented. No focal neurological deficits. Extremities: Symmetric 5 x 5 power. Skin: No rashes, lesions or ulcers Psychiatry: Judgement and insight appear normal. Mood & affect appropriate.     Data Reviewed: I have personally reviewed following labs and imaging studies  CBC: Recent Labs  Lab 07/23/20 1050 07/24/20 0532 07/25/20 0632  WBC 9.4  9.2 5.1 6.6  NEUTROABS 8.1* 4.1 5.6  HGB 12.5  12.9 10.1* 10.3*  HCT 38.3  38.0 29.9* 30.2*  MCV 91.0  88.0 87.9 86.3  PLT 347  330 270 875   Basic Metabolic Panel: Recent Labs  Lab 07/23/20 1050 07/23/20 2020 07/24/20 0532 07/24/20 1948 07/25/20 0632  NA 135  --  135  --  139  K 3.9  --  2.9* 3.2* 3.8  CL 102  --  106  --  105  CO2 25  --  25  --  25  GLUCOSE 111*  --  95  --  143*  BUN 15  --  11  --  6  CREATININE 0.63  --  0.65  --  0.63  CALCIUM 9.0  --  7.9*  --  8.4*  MG  --  1.4* 1.5*  --  1.9  PHOS  --  3.2 2.6  --   --    GFR: Estimated Creatinine Clearance: 94.9 mL/min (by C-G formula based on SCr of 0.63 mg/dL). Liver Function Tests: Recent Labs  Lab 07/23/20 1050 07/24/20 0532  AST 28 18  ALT 36 26  ALKPHOS 64 55  BILITOT 0.8 1.0  PROT 8.0 6.1*  ALBUMIN 4.1 3.2*   Recent  Labs  Lab 07/23/20 1050  LIPASE 30   No results for input(s): AMMONIA in the last 168 hours. Coagulation Profile: No results for input(s): INR, PROTIME in the last 168 hours. Cardiac Enzymes: Recent Labs  Lab 07/23/20 2020  CKTOTAL 53   BNP (last 3 results) No results for input(s): PROBNP in the last 8760 hours. HbA1C: No results for input(s): HGBA1C in the last 72 hours. CBG: Recent Labs  Lab 07/24/20 0050  GLUCAP 91   Lipid Profile: No results for input(s): CHOL, HDL, LDLCALC, TRIG, CHOLHDL, LDLDIRECT in the last 72 hours. Thyroid Function Tests: Recent Labs    07/23/20 1629 07/24/20 0532  TSH 0.699 0.324*  FREET4 0.93  --    Anemia Panel: No results  for input(s): VITAMINB12, FOLATE, FERRITIN, TIBC, IRON, RETICCTPCT in the last 72 hours. Sepsis Labs: Recent Labs  Lab 07/23/20 2020 07/23/20 2147  PROCALCITON  --  <0.10  LATICACIDVEN 1.1  --     Recent Results (from the past 240 hour(s))  Resp Panel by RT-PCR (Flu A&B, Covid) Nasopharyngeal Swab     Status: None   Collection Time: 07/23/20 11:56 AM   Specimen: Nasopharyngeal Swab; Nasopharyngeal(NP) swabs in vial transport medium  Result Value Ref Range Status   SARS Coronavirus 2 by RT PCR NEGATIVE NEGATIVE Final    Comment: (NOTE) SARS-CoV-2 target nucleic acids are NOT DETECTED.  The SARS-CoV-2 RNA is generally detectable in upper respiratory specimens during the acute phase of infection. The lowest concentration of SARS-CoV-2 viral copies this assay can detect is 138 copies/mL. A negative result does not preclude SARS-Cov-2 infection and should not be used as the sole basis for treatment or other patient management decisions. A negative result may occur with  improper specimen collection/handling, submission of specimen other than nasopharyngeal swab, presence of viral mutation(s) within the areas targeted by this assay, and inadequate number of viral copies(<138 copies/mL). A negative result must be  combined with clinical observations, patient history, and epidemiological information. The expected result is Negative.  Fact Sheet for Patients:  EntrepreneurPulse.com.au  Fact Sheet for Healthcare Providers:  IncredibleEmployment.be  This test is no t yet approved or cleared by the [place] FDA and  has been authorized for detection and/or diagnosis of SARS-CoV-2 by FDA under an Emergency Use Authorization (EUA). This EUA will remain  in effect (meaning this test can be used) for the duration of the COVID-19 declaration under Section 564(b)(1) of the Act, 21 U.S.C.section 360bbb-3(b)(1), unless the authorization is terminated  or revoked sooner.       Influenza A by PCR NEGATIVE NEGATIVE Final   Influenza B by PCR NEGATIVE NEGATIVE Final    Comment: (NOTE) The Xpert Xpress SARS-CoV-2/FLU/RSV plus assay is intended as an aid in the diagnosis of influenza from Nasopharyngeal swab specimens and should not be used as a sole basis for treatment. Nasal washings and aspirates are unacceptable for Xpert Xpress SARS-CoV-2/FLU/RSV testing.  Fact Sheet for Patients: EntrepreneurPulse.com.au  Fact Sheet for Healthcare Providers: IncredibleEmployment.be  This test is not yet approved or cleared by the [place] FDA and has been authorized for detection and/or diagnosis of SARS-CoV-2 by FDA under an Emergency Use Authorization (EUA). This EUA will remain in effect (meaning this test can be used) for the duration of the COVID-19 declaration under Section 564(b)(1) of the Act, 21 U.S.C. section 360bbb-3(b)(1), unless the authorization is terminated or revoked.  Performed at [facility], [address] [place], [place] [address]   CULTURE, BLOOD (ROUTINE X 2) w Reflex to ID Panel     Status: None (Preliminary result)   Collection Time: 07/23/20  9:47 PM   Specimen: BLOOD  Result Value Ref  Range Status   Specimen Description BLOOD BLOOD RIGHT HAND  Final   Special Requests   Final    BOTTLES DRAWN AEROBIC AND ANAEROBIC Blood Culture adequate volume   Culture   Final    NO GROWTH 3 DAYS Performed at [facility], [address] [place], [place] [address]    Report Status PENDING  Incomplete  CULTURE, BLOOD (ROUTINE X 2) w Reflex to ID Panel     Status: None (Preliminary result)   Collection Time: 07/23/20  9:51 PM   Specimen: BLOOD  Result Value Ref Range Status   Specimen Description BLOOD BLOOD LEFT HAND  Final   Special Requests   Final    BOTTLES DRAWN AEROBIC AND ANAEROBIC Blood Culture adequate volume   Culture   Final    NO GROWTH 3 DAYS Performed at [facility], [address] [place], [place] [address]    Report Status PENDING  Incomplete  Aerobic/Anaerobic Culture w Gram Stain (surgical/deep wound)     Status: None (Preliminary result)   Collection Time: 07/24/20  9:56 PM   Specimen: PATH Other; Wound  Result Value Ref Range Status   Specimen Description PERITONEAL FLUID  Final   Special Requests   Final    CUP PATIENT ON FOLLOWING CEFIPIME,FLAGYL,VANCOMYCIN   Gram Stain   Final    RARE WBC PRESENT, PREDOMINANTLY MONONUCLEAR NO ORGANISMS SEEN    Culture   Final    NO GROWTH 1 DAY Performed at [facility], [address] [place],  [address]    Report Status PENDING  Incomplete         Radiology Studies: No results found.      Scheduled Meds:  dicyclomine  20 mg Oral TID AC   metoCLOPramide (REGLAN) injection  10 mg Intravenous Q8H   pantoprazole  40 mg Oral BID   simethicone  80 mg Oral Once   simethicone  80 mg Oral QID   Continuous Infusions:  sodium chloride 100 mL/hr at 07/26/20 0333   promethazine (PHENERGAN) injection (IM or IVPB) Stopped (07/24/20 0424)     LOS: 2 days    Time spent: 15 minutes    Sidney Ace, MD [facility] Pager [phone]  If 7PM-7AM, please  contact night-coverage 07/26/2020, 2:15 PM

## 2020-07-27 MED ORDER — METHOCARBAMOL 750 MG PO TABS
750.0000 mg | ORAL_TABLET | Freq: Three times a day (TID) | ORAL | Status: DC
  Administered 2020-07-27 – 2020-07-28 (×4): 750 mg via ORAL
  Filled 2020-07-27 (×6): qty 1

## 2020-07-27 MED ORDER — POLYETHYLENE GLYCOL 3350 17 G PO PACK
17.0000 g | PACK | Freq: Once | ORAL | Status: AC
  Administered 2020-07-27: 17 g via ORAL
  Filled 2020-07-27: qty 1

## 2020-07-27 NOTE — Progress Notes (Signed)
Physical Therapy Treatment Patient Details Name: Anita Newton MRN: [id number] DOB: 1980-07-01 Today's Date: 07/27/2020    History of Present Illness Pt. is a 40 y.o. female  who was admitted to [facility] with Sepsis, and Right lower quadrant pain. Pt. undewent Diagnostic Laparoscopy, and Lysis of lesions. Pt. PMX includes: Ovarian Cysts, recent laparoscopy hysterectomy s/p 8 months with salpingectomy cystoscpy (s/p 8 months ago), Anemia, Depression, and HTN.    PT Comments    OOB, to bathroom then completes lap around unit with IV pole for support.  Stated she feels more comfortable with UE support.  Has RW at home and equipment needed.   Follow Up Recommendations  No PT follow up     Equipment Recommendations  None recommended by PT    Recommendations for Other Services       Precautions / Restrictions Precautions Precautions: Fall Restrictions Weight Bearing Restrictions: No    Mobility  Bed Mobility Overal bed mobility: Modified Independent                  Transfers Overall transfer level: Modified independent                  Ambulation/Gait Ambulation/Gait assistance: Supervision Gait Distance (Feet): 200 Feet Assistive device: IV Pole Gait Pattern/deviations: Step-through pattern;Decreased step length - right;Decreased step length - left;Trunk flexed Gait velocity: decr   General Gait Details: labored gait due to pain, brief rest breaks needed due to fatigue   Stairs             Wheelchair Mobility    Modified Rankin (Stroke Patients Only)       Balance Overall balance assessment: Needs assistance Sitting-balance support: Feet supported Sitting balance-Leahy Scale: Normal     Standing balance support: Single extremity supported Standing balance-Leahy Scale: Fair Standing balance comment: unsteady without at leat 1 UE assist.  Feels comfortable with IV pole for support had RW at home which she will use.                             Cognition Arousal/Alertness: Awake/alert Behavior During Therapy: WFL for tasks assessed/performed Overall Cognitive Status: Within Functional Limits for tasks assessed                                        Exercises      General Comments        Pertinent Vitals/Pain Pain Assessment: Faces Faces Pain Scale: Hurts even more Pain Location: Abdomen Pain Descriptors / Indicators: Sore;Discomfort;Grimacing;Guarding Pain Intervention(s): Repositioned;Monitored during session    Home Living                      Prior Function            PT Goals (current goals can now be found in the care plan section) Progress towards PT goals: Progressing toward goals    Frequency    Min 2X/week      PT Plan Current plan remains appropriate    Co-evaluation              AM-PAC PT "6 Clicks" Mobility   Outcome Measure  Help needed turning from your back to your side while in a flat bed without using bedrails?: None Help needed moving from lying on your back to sitting on the side  of a flat bed without using bedrails?: A Little Help needed moving to and from a bed to a chair (including a wheelchair)?: A Little Help needed standing up from a chair using your arms (e.g., wheelchair or bedside chair)?: A Little Help needed to walk in hospital room?: A Little Help needed climbing 3-5 steps with a railing? : A Little 6 Click Score: 19    End of Session   Activity Tolerance: Patient tolerated treatment well Patient left: in bed;with call bell/phone within reach;with bed alarm set Nurse Communication: Mobility status PT Visit Diagnosis: Muscle weakness (generalized) (M62.81);Difficulty in walking, not elsewhere classified (R26.2);Pain     Time: 1145-1159 PT Time Calculation (min) (ACUTE ONLY): 14 min  Charges:  $Gait Training: 8-22 mins                    Chesley Noon, PTA 07/27/20, 1:20 PM , 1:18 PM

## 2020-07-27 NOTE — Progress Notes (Signed)
PROGRESS NOTE    Anita Newton  ZOX:[id number] DOB: 1981/02/01 DOA: 07/23/2020 PCP: Steele Sizer, MD   Brief Narrative:  40 y.o. female with medical history significant of  anemia, depression, HTN     Presented with  severe chest burning  she drank baking soda with water and went to bed she was ok in [place] and went to work but later on developed nausea and vomiting with abdominal pain Patient reports generalized body aches most significant pain in the lower abdomen.  No associated diarrhea. She states she has known history of ovarian cyst and have seen OB/GYN for this in the past she was reassured regarding this Advised patient to return to her OB/GYN for follow-up  Patient's had some fevers overnight and met sepsis criteria.  She was started on broad-spectrum IV antibiotics.  Continues to have significant diffuse abdominal tenderness associated with nausea and poor p.o. intake.  Given her OB/GYN history I did reach out to the gynecologist for consultation.  There is some concern for ovarian torsion though this would not explain the fevers.    Patient underwent diagnostic laparoscopy with Dr. Glennon Mac of OB/GYN on 6/14.  Findings negative for ovarian torsion, no evidence of intra-abdominal abscess or other clear intra-abdominal pathology that could explain patient's symptoms.  All antibiotics have been discontinued.  Patient continues to endorse symptoms of abdominal pain, nausea, poor p.o. intake, dizzy when ambulating.  Abdominal pain seems to be the main complaint with the nausea improving and tolerating small amounts of p.o.  Assessment & Plan:   Active Problems:   Hypertension, benign   Intractable nausea and vomiting   Ovarian cyst   Dehydration   Sepsis ([place])   RLQ abdominal pain  Sepsis of unknown etiology, ruled out Possible differentials include infected ovarian cyst, ovarian abscess.   Status post diagnostic laparoscopy 6/14 Ovarian cysts infection and ovarian abscess  have been ruled out Ovarian torsion is also been ruled No clear intra-abdominal pathology that can explain this patient's symptoms At this point I suspect a viral gastroenteritis Patient continues to be symptomatic complaining of abdominal pain, nausea, dizziness while attempting to ambulate.  She spends the majority of the day in bed is not motivated to exert herself -At this point I do not suspect sepsis.  Sepsis ruled out Plan: No indication for antibiotics.  Negative procalcitonin.  Cultures no growth to date.  Discontinue IV fluids.  Discontinue all narcotics.  Patient needs to mobilize.  Hypertension Blood pressure currently controlled Home agents on hold Ensure pain control Restart home agents when blood pressure dictates  Known ovarian cyst Imaging reveals complex and septated ovarian cyst.  Status post diagnostic laparoscopy 6/14.  No evidence of infection or abscess.  Negative for ovarian torsion.  Intractable nausea vomiting Diffuse abdominal pain Unclear etiology.  Abdominal pain appears to be improving now patient is endorsing periincisional pain and persistent nausea and dizziness.     DVT prophylaxis: SCDs Code Status: Full Family Communication: None today Disposition Plan: Status is: Inpatient  Remains inpatient appropriate because:Inpatient level of care appropriate due to severity of illness  Dispo: The patient is from: Home              Anticipated d/c is to: Home              Patient currently is not medically stable to d/c.   Difficult to place patient No Remains symptomatic.  Abdominal pain associated with nausea and poor p.o. intake.  Level of care: Progressive Cardiac  Consultants:  OB/GYN  Procedures:  Diagnostic laparoscopy 6/14  Antimicrobials:    Subjective: Patient seen and examined.  Appears lethargic.  Endorsing abdominal pain near surgical incision site.  Objective: Vitals:   07/26/20 1928 07/27/20 0420 07/27/20 0731 07/27/20  1520  BP: 121/86 122/84 133/85 (!) 124/92  Pulse: 82 67 77 84  Resp: _0 Temp: 99.1 F (37.3 C) 98.7 F (37.1 C) 97.6 F (36.4 C)   TempSrc: Oral Oral Oral   SpO2: 96%  100% 98%  Weight:      Height:        Intake/Output Summary (Last 24 hours) at 07/27/2020 1522 Last data filed at 07/27/2020 1342 Gross per 24 hour  Intake 1080 ml  Output 1800 ml  Net -720 ml   Filed Weights   07/23/20 1048 07/23/20 2313  Weight: 89.4 kg 92.6 kg    Examination:  General exam: no acute distress.  Appears fatigued Respiratory system: Clear to auscultation. Respiratory effort normal. Cardiovascular system: Regular rate and rhythm, no murmurs Gastrointestinal system: Soft, nondistended, tender to palpation near surgical incision site.  Normal bowel sounds Central nervous system: Alert and oriented. No focal neurological deficits. Extremities: Symmetric 5 x 5 power. Skin: No rashes, lesions or ulcers Psychiatry: Judgement and insight appear normal. Mood & affect appropriate.     Data Reviewed: I have personally reviewed following labs and imaging studies  CBC: Recent Labs  Lab 07/23/20 1050 07/24/20 0532 07/25/20 0632  WBC 9.4  9.2 5.1 6.6  NEUTROABS 8.1* 4.1 5.6  HGB 12.5  12.9 10.1* 10.3*  HCT 38.3  38.0 29.9* 30.2*  MCV 91.0  88.0 87.9 86.3  PLT 347  330 270 353   Basic Metabolic Panel: Recent Labs  Lab 07/23/20 1050 07/23/20 2020 07/24/20 0532 07/24/20 1948 07/25/20 0632  NA 135  --  135  --  139  K 3.9  --  2.9* 3.2* 3.8  CL 102  --  106  --  105  CO2 25  --  25  --  25  GLUCOSE 111*  --  95  --  143*  BUN 15  --  11  --  6  CREATININE 0.63  --  0.65  --  0.63  CALCIUM 9.0  --  7.9*  --  8.4*  MG  --  1.4* 1.5*  --  1.9  PHOS  --  3.2 2.6  --   --    GFR: Estimated Creatinine Clearance: 94.9 mL/min (by C-G formula based on SCr of 0.63 mg/dL). Liver Function Tests: Recent Labs  Lab 07/23/20 1050 07/24/20 0532  AST 28 18  ALT 36 26  ALKPHOS  64 55  BILITOT 0.8 1.0  PROT 8.0 6.1*  ALBUMIN 4.1 3.2*   Recent Labs  Lab 07/23/20 1050  LIPASE 30   No results for input(s): AMMONIA in the last 168 hours. Coagulation Profile: No results for input(s): INR, PROTIME in the last 168 hours. Cardiac Enzymes: Recent Labs  Lab 07/23/20 2020  CKTOTAL 53   BNP (last 3 results) No results for input(s): PROBNP in the last 8760 hours. HbA1C: No results for input(s): HGBA1C in the last 72 hours. CBG: Recent Labs  Lab 07/24/20 0050  GLUCAP 91   Lipid Profile: No results for input(s): CHOL, HDL, LDLCALC, TRIG, CHOLHDL, LDLDIRECT in the last 72 hours. Thyroid Function Tests: No results for input(s): TSH, T4TOTAL, FREET4, T3FREE, THYROIDAB in the last  72 hours.  Anemia Panel: No results for input(s): VITAMINB12, FOLATE, FERRITIN, TIBC, IRON, RETICCTPCT in the last 72 hours. Sepsis Labs: Recent Labs  Lab 07/23/20 2020 07/23/20 2147  PROCALCITON  --  <0.10  LATICACIDVEN 1.1  --     Recent Results (from the past 240 hour(s))  Resp Panel by RT-PCR (Flu A&B, Covid) Nasopharyngeal Swab     Status: None   Collection Time: 07/23/20 11:56 AM   Specimen: Nasopharyngeal Swab; Nasopharyngeal(NP) swabs in vial transport medium  Result Value Ref Range Status   SARS Coronavirus 2 by RT PCR NEGATIVE NEGATIVE Final    Comment: (NOTE) SARS-CoV-2 target nucleic acids are NOT DETECTED.  The SARS-CoV-2 RNA is generally detectable in upper respiratory specimens during the acute phase of infection. The lowest concentration of SARS-CoV-2 viral copies this assay can detect is 138 copies/mL. A negative result does not preclude SARS-Cov-2 infection and should not be used as the sole basis for treatment or other patient management decisions. A negative result may occur with  improper specimen collection/handling, submission of specimen other than nasopharyngeal swab, presence of viral mutation(s) within the areas targeted by this assay, and  inadequate number of viral copies(<138 copies/mL). A negative result must be combined with clinical observations, patient history, and epidemiological information. The expected result is Negative.  Fact Sheet for Patients:  EntrepreneurPulse.com.au  Fact Sheet for Healthcare Providers:  IncredibleEmployment.be  This test is no t yet approved or cleared by the [place] FDA and  has been authorized for detection and/or diagnosis of SARS-CoV-2 by FDA under an Emergency Use Authorization (EUA). This EUA will remain  in effect (meaning this test can be used) for the duration of the COVID-19 declaration under Section 564(b)(1) of the Act, 21 U.S.C.section 360bbb-3(b)(1), unless the authorization is terminated  or revoked sooner.       Influenza A by PCR NEGATIVE NEGATIVE Final   Influenza B by PCR NEGATIVE NEGATIVE Final    Comment: (NOTE) The Xpert Xpress SARS-CoV-2/FLU/RSV plus assay is intended as an aid in the diagnosis of influenza from Nasopharyngeal swab specimens and should not be used as a sole basis for treatment. Nasal washings and aspirates are unacceptable for Xpert Xpress SARS-CoV-2/FLU/RSV testing.  Fact Sheet for Patients: EntrepreneurPulse.com.au  Fact Sheet for Healthcare Providers: IncredibleEmployment.be  This test is not yet approved or cleared by the [place] FDA and has been authorized for detection and/or diagnosis of SARS-CoV-2 by FDA under an Emergency Use Authorization (EUA). This EUA will remain in effect (meaning this test can be used) for the duration of the COVID-19 declaration under Section 564(b)(1) of the Act, 21 U.S.C. section 360bbb-3(b)(1), unless the authorization is terminated or revoked.  Performed at [facility], [address] [place], [place] [address]   CULTURE, BLOOD (ROUTINE X 2) w Reflex to ID Panel     Status: None (Preliminary result)    Collection Time: 07/23/20  9:47 PM   Specimen: BLOOD  Result Value Ref Range Status   Specimen Description BLOOD BLOOD RIGHT HAND  Final   Special Requests   Final    BOTTLES DRAWN AEROBIC AND ANAEROBIC Blood Culture adequate volume   Culture   Final    NO GROWTH 3 DAYS Performed at [facility], [address] [place], [place] [address]    Report Status PENDING  Incomplete  CULTURE, BLOOD (ROUTINE X 2) w Reflex to ID Panel     Status: None (Preliminary result)   Collection Time: 07/23/20  9:51 PM   Specimen: BLOOD  Result Value Ref Range Status   Specimen Description BLOOD BLOOD LEFT HAND  Final   Special Requests   Final    BOTTLES DRAWN AEROBIC AND ANAEROBIC Blood Culture adequate volume   Culture   Final    NO GROWTH 3 DAYS Performed at [facility], [address] [place], [place] [address]    Report Status PENDING  Incomplete  Aerobic/Anaerobic Culture w Gram Stain (surgical/deep wound)     Status: None (Preliminary result)   Collection Time: 07/24/20  9:56 PM   Specimen: PATH Other; Wound  Result Value Ref Range Status   Specimen Description PERITONEAL FLUID  Final   Special Requests   Final    CUP PATIENT ON FOLLOWING CEFIPIME,FLAGYL,VANCOMYCIN   Gram Stain   Final    RARE WBC PRESENT, PREDOMINANTLY MONONUCLEAR NO ORGANISMS SEEN    Culture   Final    NO GROWTH 2 DAYS NO ANAEROBES ISOLATED; CULTURE IN PROGRESS FOR 5 DAYS Performed at [facility], [address], [place], [place] [address]    Report Status PENDING  Incomplete         Radiology Studies: No results found.      Scheduled Meds:  dicyclomine  20 mg Oral TID AC   methocarbamol  750 mg Oral TID   metoCLOPramide (REGLAN) injection  10 mg Intravenous Q8H   pantoprazole  40 mg Oral BID   simethicone  80 mg Oral Once   simethicone  80 mg Oral QID   Continuous Infusions:  promethazine (PHENERGAN) injection (IM or IVPB) Stopped (07/24/20 0424)     LOS: 3 days     Time spent: 15 minutes    Sidney Ace, MD [facility] Pager [phone]  If 7PM-7AM, please contact night-coverage 07/27/2020, 3:22 PM

## 2020-07-28 DIAGNOSIS — N83201 Unspecified ovarian cyst, right side: Secondary | ICD-10-CM

## 2020-07-28 LAB — CULTURE, BLOOD (ROUTINE X 2)
Culture: NO GROWTH
Culture: NO GROWTH
Special Requests: ADEQUATE
Special Requests: ADEQUATE

## 2020-07-28 MED ORDER — METOCLOPRAMIDE HCL 10 MG PO TABS: 10.0000 mg | ORAL_TABLET | Freq: Three times a day (TID) | ORAL | 0 refills | Status: DC

## 2020-07-28 MED ORDER — METOCLOPRAMIDE HCL 10 MG PO TABS
10.0000 mg | ORAL_TABLET | Freq: Three times a day (TID) | ORAL | Status: DC
  Administered 2020-07-28: 10 mg via ORAL
  Filled 2020-07-28: qty 1

## 2020-07-28 MED ORDER — PANTOPRAZOLE SODIUM 40 MG PO TBEC: 40.0000 mg | DELAYED_RELEASE_TABLET | Freq: Every day | ORAL | 0 refills | Status: DC

## 2020-07-28 MED ORDER — OXYCODONE HCL 5 MG PO TABS: 5.0000 mg | ORAL_TABLET | Freq: Four times a day (QID) | ORAL | 0 refills | Status: DC | PRN

## 2020-07-28 MED ORDER — DICYCLOMINE HCL 20 MG PO TABS: 20.0000 mg | ORAL_TABLET | Freq: Three times a day (TID) | ORAL | 0 refills | Status: DC | PRN

## 2020-07-28 MED ORDER — DRONABINOL 2.5 MG PO CAPS
5.0000 mg | ORAL_CAPSULE | Freq: Two times a day (BID) | ORAL | Status: DC
  Administered 2020-07-28: 5 mg via ORAL
  Filled 2020-07-28: qty 2

## 2020-07-28 MED ORDER — DRONABINOL 5 MG PO CAPS: 5.0000 mg | ORAL_CAPSULE | Freq: Two times a day (BID) | ORAL | 0 refills | Status: DC | PRN

## 2020-07-28 MED ORDER — DOCUSATE SODIUM 100 MG PO CAPS: 100.0000 mg | ORAL_CAPSULE | Freq: Two times a day (BID) | ORAL | 0 refills | Status: DC | PRN

## 2020-07-28 NOTE — Discharge Summary (Signed)
Physician Discharge Summary  Anita Newton YJE:[id number] DOB: Dec 06, 1980 DOA: 07/23/2020  PCP: Steele Sizer, MD  Admit date: 07/23/2020 Discharge date: 07/28/2020  Admitted From: Home Disposition:  Home  Recommendations for Outpatient Follow-up:  Follow up with PCP in 1-2 weeks   Home Health:No Equipment/Devices:None  Discharge Condition:Stable CODE STATUS:FUll Diet recommendation: Bland/small meals  Brief/Interim Summary: 40 y.o. female with medical history significant of  anemia, depression, HTN     Presented with  severe chest burning  she drank baking soda with water and went to bed she was ok in Am and went to work but later on developed nausea and vomiting with abdominal pain Patient reports generalized body aches most significant pain in the lower abdomen.  No associated diarrhea. She states she has known history of ovarian cyst and have seen OB/GYN for this in the past she was reassured regarding this Advised patient to return to her OB/GYN for follow-up   Patient's had some fevers overnight and met sepsis criteria.  She was started on broad-spectrum IV antibiotics.  Continues to have significant diffuse abdominal tenderness associated with nausea and poor p.o. intake.  Given her OB/GYN history I did reach out to the gynecologist for consultation.  There is some concern for ovarian torsion though this would not explain the fevers.     Patient underwent diagnostic laparoscopy with Dr. Glennon Mac of OB/GYN on 6/14.  Findings negative for ovarian torsion, no evidence of intra-abdominal abscess or other clear intra-abdominal pathology that could explain patient's symptoms.   All antibiotics have been discontinued.  Patient continues to endorse symptoms of abdominal pain, nausea, poor p.o. intake, dizzy when ambulating.  Abdominal pain seems to be the main complaint with the nausea improving and tolerating small amounts of p.o.  Abdominal pain and nausea improved at time of  dc.  Stable for discharge home.  Multimodal antiemetic and pain regimen prescribed  Discharge Diagnoses:  Active Problems:   Hypertension, benign   Intractable nausea and vomiting   Ovarian cyst   Dehydration   Sepsis ([place])   RLQ abdominal pain  Sepsis of unknown etiology, ruled out Possible differentials include infected ovarian cyst, ovarian abscess.   Status post diagnostic laparoscopy 6/14 Ovarian cysts infection and ovarian abscess have been ruled out Ovarian torsion is also been ruled No clear intra-abdominal pathology that can explain this patient's symptoms At this point I suspect a viral gastroenteritis Patient continues to be symptomatic complaining of abdominal pain, nausea, dizziness while attempting to ambulate.  She spends the majority of the day in bed is not motivated to exert herself -At this point I do not suspect sepsis.  Sepsis ruled out Plan: No indication for antibiotics.  Negative procalcitonin.  Cultures no growth to date.  Stable for dc home.  PRN antiemetics and pain regimen prescribed   Hypertension Blood pressure currently controlled Home agents on hold Ensure pain control Can resume home regimen on discharge  Known ovarian cyst Imaging reveals complex and septated ovarian cyst.  Status post diagnostic laparoscopy 6/14.  No evidence of infection or abscess.  Negative for ovarian torsion.   Intractable nausea vomiting Diffuse abdominal pain Unclear etiology.  Abdominal pain appears to be improving now patient is endorsing periincisional pain and persistent nausea and dizziness.  Discharge Instructions  Discharge Instructions     Diet - low sodium heart healthy   Complete by: As directed    Increase activity slowly   Complete by: As directed    No wound care  Complete by: As directed       Allergies as of 07/28/2020       Reactions   Cherry Hives, Swelling   Fresh cherries with a stem   Morphine And Related    Pt doesn't like the way it  makes her feel when coming off it. If has to have, will use.        Medication List     STOP taking these medications    fluticasone 50 MCG/ACT nasal spray Commonly known as: FLONASE   polyethylene glycol 17 g packet Commonly known as: MIRALAX / GLYCOLAX       TAKE these medications    ascorbic acid 500 MG tablet Commonly known as: VITAMIN C Take 500 mg by mouth daily.   dicyclomine 20 MG tablet Commonly known as: BENTYL Take 1 tablet (20 mg total) by mouth 3 (three) times daily as needed for up to 7 days for spasms.   docusate sodium 100 MG capsule Commonly known as: COLACE Take 1 capsule (100 mg total) by mouth 2 (two) times daily as needed for mild constipation.   dronabinol 5 MG capsule Commonly known as: MARINOL Take 1 capsule (5 mg total) by mouth 3 times/day as needed-between meals & bedtime for up to 14 days.   Fish Oil 1000 MG Caps Take 1,000 mg by mouth every Sunday.   loratadine 10 MG tablet Commonly known as: CLARITIN Take 1 tablet (10 mg total) by mouth daily as needed for allergies.   metoCLOPramide 10 MG tablet Commonly known as: REGLAN Take 1 tablet (10 mg total) by mouth 3 (three) times daily before meals for 14 days.   multivitamin with minerals Tabs tablet Take 1 tablet by mouth daily.   oxyCODONE 5 MG immediate release tablet Commonly known as: Oxy IR/ROXICODONE Take 1 tablet (5 mg total) by mouth every 6 (six) hours as needed for moderate pain (pain score 4-7/10).   pantoprazole 40 MG tablet Commonly known as: PROTONIX Take 1 tablet (40 mg total) by mouth daily.   simethicone 80 MG chewable tablet Commonly known as: MYLICON Chew 1 tablet (80 mg total) by mouth 4 (four) times daily.   valsartan-hydrochlorothiazide 160-25 MG tablet Commonly known as: DIOVAN-HCT TAKE 1 TABLET BY MOUTH EVERY DAY   Vitamin D (Ergocalciferol) 1.25 MG (50000 UNIT) Caps capsule Commonly known as: DRISDOL Take 1 capsule (50,000 Units total) by mouth  every Sunday.       ASK your doctor about these medications    B-12 1000 MCG Subl Place 1 tablet under the tongue daily.   ibuprofen 600 MG tablet Commonly known as: ADVIL Take 1 tablet (600 mg total) by mouth every 6 (six) hours as needed for cramping.   montelukast 10 MG tablet Commonly known as: SINGULAIR Take 1 tablet (10 mg total) by mouth at bedtime.        Allergies  Allergen Reactions   Cherry Hives and Swelling    Fresh cherries with a stem   Morphine And Related     Pt doesn't like the way it makes her feel when coming off it. If has to have, will use.    Consultations: GYN   Procedures/Studies: DG Chest 2 View  Result Date: 07/23/2020 CLINICAL DATA:  Chest pain. Additional history provided: Patient reports acid reflux, vomiting, mid chest pain. EXAM: CHEST - 2 VIEW COMPARISON:  CT angiogram chest 01/29/2015. prior chest radiographs 01/28/2015 and earlier. FINDINGS: Heart size within normal limits. No appreciable airspace consolidation. No  evidence of pleural effusion or pneumothorax. No acute bony abnormality identified. IMPRESSION: No evidence of active cardiopulmonary disease. Electronically Signed   By: Kellie Simmering DO   On: 07/23/2020 12:36   CT Head Wo Contrast  Result Date: 07/23/2020 CLINICAL DATA:  40 year old female with dizziness. EXAM: CT HEAD WITHOUT CONTRAST TECHNIQUE: Contiguous axial images were obtained from the base of the skull through the vertex without intravenous contrast. COMPARISON:  Brain MRI dated 11/17/2019. FINDINGS: Evaluation of this exam is limited due to motion artifact. Brain: The ventricles and sulci are appropriate size for the patient's age. The gray-white matter discrimination is preserved. There is no acute intracranial hemorrhage. No mass effect or midline shift. No extra-axial fluid collection. Vascular: No hyperdense vessel or unexpected calcification. Skull: Normal. Negative for fracture or focal lesion. Sinuses/Orbits: No  acute finding. Other: None IMPRESSION: Unremarkable noncontrast CT of the brain. Electronically Signed   By: Anner Crete M.D.   On: 07/23/2020 20:19   CT ABDOMEN PELVIS W CONTRAST  Result Date: 07/23/2020 CLINICAL DATA:  Right lower quadrant abdominal pain.  Vomiting. EXAM: CT ABDOMEN AND PELVIS WITH CONTRAST TECHNIQUE: Multidetector CT imaging of the abdomen and pelvis was performed using the standard protocol following bolus administration of intravenous contrast. CONTRAST:  148m OMNIPAQUE IOHEXOL 300 MG/ML  SOLN COMPARISON:  None. FINDINGS: Lower chest: Unremarkable. Hepatobiliary: No suspicious focal abnormality within the liver parenchyma. There is no evidence for gallstones, gallbladder wall thickening, or pericholecystic fluid. No intrahepatic or extrahepatic biliary dilation. Pancreas: No focal mass lesion. No dilatation of the main duct. No intraparenchymal cyst. No peripancreatic edema. Spleen: No splenomegaly. No focal mass lesion. Adrenals/Urinary Tract: No adrenal nodule or mass. Right kidney unremarkable. 9 x 5 mm nonobstructing stone identified interpolar left kidney with an additional 4 mm stone in the upper pole. Tiny hypodensity lower pole left kidney is too small to characterize but likely a benign cyst. No evidence for hydroureter. The urinary bladder appears normal for the degree of distention. Stomach/Bowel: Stomach is unremarkable. No gastric wall thickening. No evidence of outlet obstruction. Duodenum is normally positioned as is the ligament of Treitz. No small bowel wall thickening. No small bowel dilatation. The terminal ileum is normal. The appendix is normal. No gross colonic mass. No colonic wall thickening. Vascular/Lymphatic: No abdominal aortic aneurysm. No abdominal lymphadenopathy No pelvic sidewall lymphadenopathy. Reproductive: Uterus surgically absent. 3.8 x 3.2 cm complex cystic lesion identified posterior right adnexal space. Other: No intraperitoneal free fluid.  Musculoskeletal: No worrisome lytic or sclerotic osseous abnormality. IMPRESSION: 1. 3.8 x 3.2 cm complex cystic lesion posterior right adnexal space. Given the history of right lower quadrant pain, pelvic ultrasound recommended to further evaluate. 2. Nonobstructing left renal stones. Electronically Signed   By: EMisty StanleyM.D.   On: 07/23/2020 13:29   [place]LV[place] COMPLETE W TRANSVAGINAL AND TORSION R/O  Result Date: 07/23/2020 CLINICAL DATA:  40year old female with lower abdominal pain. EXAM: TRANSABDOMINAL AND TRANSVAGINAL ULTRASOUND OF PELVIS DOPPLER ULTRASOUND OF OVARIES TECHNIQUE: Both transabdominal and transvaginal ultrasound examinations of the pelvis were performed. Transabdominal technique was performed for global imaging of the pelvis including uterus, ovaries, adnexal regions, and pelvic cul-de-sac. It was necessary to proceed with endovaginal exam following the transabdominal exam to visualize the ovaries. Color and duplex Doppler ultrasound was utilized to evaluate blood flow to the ovaries. COMPARISON:  CT of the pelvis dated 07/25/2020. FINDINGS: Uterus Hysterectomy. Endometrium Hysterectomy. Right ovary Measurements: 4.8 x 2.7 x 3.4 cm = volume: 23.4 mL. There  is a 3.2 x 2.1 x 2.9 cm septated/complex cyst or 2 adjacent cysts in the right ovary versus paraovarian cyst. Left ovary Measurements: 2.9 x 2.3 x 1.9 cm = volume: 6.6 mL. Normal appearance/no adnexal mass. Pulsed Doppler evaluation of both ovaries demonstrates normal low-resistance arterial and venous waveforms. Other findings No abnormal free fluid. IMPRESSION: 1. Complex/septated right ovarian or paraovarian cysts. 2. Hysterectomy. Electronically Signed   By: Anner Crete M.D.   On: 07/23/2020 18:54   (Echo, Carotid, EGD, Colonoscopy, ERCP)    Subjective: Seen and examined at the time of discharge.  Abd pain and nausea improved.  Stable for discharge home.  Discharge Exam: Vitals:   07/28/20 1136 07/28/20 1439  BP: (!)  132/97 119/88  Pulse: 80 86  Resp: 18 18  Temp: 98.5 F (36.9 C) 97.9 F (36.6 C)  SpO2: 99% 98%   Vitals:   07/28/20 0418 07/28/20 0743 07/28/20 1136 07/28/20 1439  BP: (!) 125/93 (!) 147/98 (!) 132/97 119/88  Pulse: 81 83 80 86  Resp: 16 16 18 18   Temp: 98.1 F (36.7 C) 98.1 F (36.7 C) 98.5 F (36.9 C) 97.9 F (36.6 C)  TempSrc:      SpO2: 98% 100% 99% 98%  Weight:      Height:        General: Pt is alert, awake, not in acute distress Cardiovascular: RRR, S1/S2 +, no rubs, no gallops Respiratory: CTA bilaterally, no wheezing, no rhonchi Abdominal: Soft, NT, ND, bowel sounds + Extremities: no edema, no cyanosis    The results of significant diagnostics from this hospitalization (including imaging, microbiology, ancillary and laboratory) are listed below for reference.     Microbiology: Recent Results (from the past 240 hour(s))  Resp Panel by RT-PCR (Flu A&B, Covid) Nasopharyngeal Swab     Status: None   Collection Time: 07/23/20 11:56 AM   Specimen: Nasopharyngeal Swab; Nasopharyngeal(NP) swabs in vial transport medium  Result Value Ref Range Status   SARS Coronavirus 2 by RT PCR NEGATIVE NEGATIVE Final    Comment: (NOTE) SARS-CoV-2 target nucleic acids are NOT DETECTED.  The SARS-CoV-2 RNA is generally detectable in upper respiratory specimens during the acute phase of infection. The lowest concentration of SARS-CoV-2 viral copies this assay can detect is 138 copies/mL. A negative result does not preclude SARS-Cov-2 infection and should not be used as the sole basis for treatment or other patient management decisions. A negative result may occur with  improper specimen collection/handling, submission of specimen other than nasopharyngeal swab, presence of viral mutation(s) within the areas targeted by this assay, and inadequate number of viral copies(<138 copies/mL). A negative result must be combined with clinical observations, patient history, and  epidemiological information. The expected result is Negative.  Fact Sheet for Patients:  EntrepreneurPulse.com.au  Fact Sheet for Healthcare Providers:  IncredibleEmployment.be  This test is no t yet approved or cleared by the [place] FDA and  has been authorized for detection and/or diagnosis of SARS-CoV-2 by FDA under an Emergency Use Authorization (EUA). This EUA will remain  in effect (meaning this test can be used) for the duration of the COVID-19 declaration under Section 564(b)(1) of the Act, 21 U.S.C.section 360bbb-3(b)(1), unless the authorization is terminated  or revoked sooner.       Influenza A by PCR NEGATIVE NEGATIVE Final   Influenza B by PCR NEGATIVE NEGATIVE Final    Comment: (NOTE) The Xpert Xpress SARS-CoV-2/FLU/RSV plus assay is intended as an aid in the diagnosis of  influenza from Nasopharyngeal swab specimens and should not be used as a sole basis for treatment. Nasal washings and aspirates are unacceptable for Xpert Xpress SARS-CoV-2/FLU/RSV testing.  Fact Sheet for Patients: EntrepreneurPulse.com.au  Fact Sheet for Healthcare Providers: IncredibleEmployment.be  This test is not yet approved or cleared by the [place] FDA and has been authorized for detection and/or diagnosis of SARS-CoV-2 by FDA under an Emergency Use Authorization (EUA). This EUA will remain in effect (meaning this test can be used) for the duration of the COVID-19 declaration under Section 564(b)(1) of the Act, 21 U.S.C. section 360bbb-3(b)(1), unless the authorization is terminated or revoked.  Performed at [facility], [address] [place], [place] [address]   CULTURE, BLOOD (ROUTINE X 2) w Reflex to ID Panel     Status: None   Collection Time: 07/23/20  9:47 PM   Specimen: BLOOD  Result Value Ref Range Status   Specimen Description BLOOD BLOOD RIGHT HAND  Final   Special  Requests   Final    BOTTLES DRAWN AEROBIC AND ANAEROBIC Blood Culture adequate volume   Culture   Final    NO GROWTH 5 DAYS Performed at [facility], [address] [place], [place] [address]    Report Status 07/28/2020 FINAL  Final  CULTURE, BLOOD (ROUTINE X 2) w Reflex to ID Panel     Status: None   Collection Time: 07/23/20  9:51 PM   Specimen: BLOOD  Result Value Ref Range Status   Specimen Description BLOOD BLOOD LEFT HAND  Final   Special Requests   Final    BOTTLES DRAWN AEROBIC AND ANAEROBIC Blood Culture adequate volume   Culture   Final    NO GROWTH 5 DAYS Performed at [facility], [address], [place], [place] [address]    Report Status 07/28/2020 FINAL  Final  Aerobic/Anaerobic Culture w Gram Stain (surgical/deep wound)     Status: None (Preliminary result)   Collection Time: 07/24/20  9:56 PM   Specimen: PATH Other; Wound  Result Value Ref Range Status   Specimen Description PERITONEAL FLUID  Final   Special Requests   Final    CUP PATIENT ON FOLLOWING CEFIPIME,FLAGYL,VANCOMYCIN   Gram Stain   Final    RARE WBC PRESENT, PREDOMINANTLY MONONUCLEAR NO ORGANISMS SEEN    Culture   Final    NO GROWTH 3 DAYS NO ANAEROBES ISOLATED; CULTURE IN PROGRESS FOR 5 DAYS Performed at [facility], [address], [place], [place] [address]    Report Status PENDING  Incomplete     Labs: BNP (last 3 results) No results for input(s): BNP in the last 8760 hours. Basic Metabolic Panel: Recent Labs  Lab 07/23/20 1050 07/23/20 2020 07/24/20 0532 07/24/20 1948 07/25/20 0632  NA 135  --  135  --  139  K 3.9  --  2.9* 3.2* 3.8  CL 102  --  106  --  105  CO2 25  --  25  --  25  GLUCOSE 111*  --  95  --  143*  BUN 15  --  11  --  6  CREATININE 0.63  --  0.65  --  0.63  CALCIUM 9.0  --  7.9*  --  8.4*  MG  --  1.4* 1.5*  --  1.9  PHOS  --  3.2 2.6  --   --    Liver Function Tests: Recent Labs  Lab 07/23/20 1050 07/24/20 0532  AST 28 18   ALT 36 26  ALKPHOS 64 55  BILITOT 0.8 1.0  PROT 8.0 6.1*  ALBUMIN 4.1 3.2*   Recent Labs  Lab 07/23/20 1050  LIPASE 30   No results for input(s): AMMONIA in the last 168 hours. CBC: Recent Labs  Lab 07/23/20 1050 07/24/20 0532 07/25/20 0632  WBC 9.4  9.2 5.1 6.6  NEUTROABS 8.1* 4.1 5.6  HGB 12.5  12.9 10.1* 10.3*  HCT 38.3  38.0 29.9* 30.2*  MCV 91.0  88.0 87.9 86.3  PLT 347  330 270 288   Cardiac Enzymes: Recent Labs  Lab 07/23/20 2020  CKTOTAL 53   BNP: Invalid input(s): POCBNP CBG: Recent Labs  Lab 07/24/20 0050  GLUCAP 91   D-Dimer No results for input(s): DDIMER in the last 72 hours. Hgb A1c No results for input(s): HGBA1C in the last 72 hours. Lipid Profile No results for input(s): CHOL, HDL, LDLCALC, TRIG, CHOLHDL, LDLDIRECT in the last 72 hours. Thyroid function studies No results for input(s): TSH, T4TOTAL, T3FREE, THYROIDAB in the last 72 hours.  Invalid input(s): FREET3 Anemia work up No results for input(s): VITAMINB12, FOLATE, FERRITIN, TIBC, IRON, RETICCTPCT in the last 72 hours. Urinalysis    Component Value Date/Time   COLORURINE YELLOW (A) 07/23/2020 1156   APPEARANCEUR HAZY (A) 07/23/2020 1156   LABSPEC 1.027 07/23/2020 1156   PHURINE 6.0 07/23/2020 1156   GLUCOSEU NEGATIVE 07/23/2020 1156   HGBUR NEGATIVE 07/23/2020 [place] 07/23/2020 1156   BILIRUBINUR trace 02/21/2020 0900   KETONESUR NEGATIVE 07/23/2020 1156   PROTEINUR NEGATIVE 07/23/2020 1156   UROBILINOGEN 0.2 02/21/2020 0900   NITRITE NEGATIVE 07/23/2020 1156   LEUKOCYTESUR NEGATIVE 07/23/2020 1156   Sepsis Labs Invalid input(s): PROCALCITONIN,  WBC,  LACTICIDVEN Microbiology Recent Results (from the past 240 hour(s))  Resp Panel by RT-PCR (Flu A&B, Covid) Nasopharyngeal Swab     Status: None   Collection Time: 07/23/20 11:56 AM   Specimen: Nasopharyngeal Swab; Nasopharyngeal(NP) swabs in vial transport medium  Result Value Ref Range Status    SARS Coronavirus 2 by RT PCR NEGATIVE NEGATIVE Final    Comment: (NOTE) SARS-CoV-2 target nucleic acids are NOT DETECTED.  The SARS-CoV-2 RNA is generally detectable in upper respiratory specimens during the acute phase of infection. The lowest concentration of SARS-CoV-2 viral copies this assay can detect is 138 copies/mL. A negative result does not preclude SARS-Cov-2 infection and should not be used as the sole basis for treatment or other patient management decisions. A negative result may occur with  improper specimen collection/handling, submission of specimen other than nasopharyngeal swab, presence of viral mutation(s) within the areas targeted by this assay, and inadequate number of viral copies(<138 copies/mL). A negative result must be combined with clinical observations, patient history, and epidemiological information. The expected result is Negative.  Fact Sheet for Patients:  EntrepreneurPulse.com.au  Fact Sheet for Healthcare Providers:  IncredibleEmployment.be  This test is no t yet approved or cleared by the [place] FDA and  has been authorized for detection and/or diagnosis of SARS-CoV-2 by FDA under an Emergency Use Authorization (EUA). This EUA will remain  in effect (meaning this test can be used) for the duration of the COVID-19 declaration under Section 564(b)(1) of the Act, 21 U.S.C.section 360bbb-3(b)(1), unless the authorization is terminated  or revoked sooner.       Influenza A by PCR NEGATIVE NEGATIVE Final   Influenza B by PCR NEGATIVE NEGATIVE Final    Comment: (NOTE)  The Xpert Xpress SARS-CoV-2/FLU/RSV plus assay is intended as an aid in the diagnosis of influenza from Nasopharyngeal swab specimens and should not be used as a sole basis for treatment. Nasal washings and aspirates are unacceptable for Xpert Xpress SARS-CoV-2/FLU/RSV testing.  Fact Sheet for  Patients: EntrepreneurPulse.com.au  Fact Sheet for Healthcare Providers: IncredibleEmployment.be  This test is not yet approved or cleared by the [place] FDA and has been authorized for detection and/or diagnosis of SARS-CoV-2 by FDA under an Emergency Use Authorization (EUA). This EUA will remain in effect (meaning this test can be used) for the duration of the COVID-19 declaration under Section 564(b)(1) of the Act, 21 U.S.C. section 360bbb-3(b)(1), unless the authorization is terminated or revoked.  Performed at [facility], [address] [place], [place] [address]   CULTURE, BLOOD (ROUTINE X 2) w Reflex to ID Panel     Status: None   Collection Time: 07/23/20  9:47 PM   Specimen: BLOOD  Result Value Ref Range Status   Specimen Description BLOOD BLOOD RIGHT HAND  Final   Special Requests   Final    BOTTLES DRAWN AEROBIC AND ANAEROBIC Blood Culture adequate volume   Culture   Final    NO GROWTH 5 DAYS Performed at [facility], [address] [place], [place] [address]    Report Status 07/28/2020 FINAL  Final  CULTURE, BLOOD (ROUTINE X 2) w Reflex to ID Panel     Status: None   Collection Time: 07/23/20  9:51 PM   Specimen: BLOOD  Result Value Ref Range Status   Specimen Description BLOOD BLOOD LEFT HAND  Final   Special Requests   Final    BOTTLES DRAWN AEROBIC AND ANAEROBIC Blood Culture adequate volume   Culture   Final    NO GROWTH 5 DAYS Performed at [facility], [address], [place], [place] [address]    Report Status 07/28/2020 FINAL  Final  Aerobic/Anaerobic Culture w Gram Stain (surgical/deep wound)     Status: None (Preliminary result)   Collection Time: 07/24/20  9:56 PM   Specimen: PATH Other; Wound  Result Value Ref Range Status   Specimen Description PERITONEAL FLUID  Final   Special Requests   Final    CUP PATIENT ON FOLLOWING CEFIPIME,FLAGYL,VANCOMYCIN   Gram Stain   Final     RARE WBC PRESENT, PREDOMINANTLY MONONUCLEAR NO ORGANISMS SEEN    Culture   Final    NO GROWTH 3 DAYS NO ANAEROBES ISOLATED; CULTURE IN PROGRESS FOR 5 DAYS Performed at [facility], [address] [place],  [address]    Report Status PENDING  Incomplete     Time coordinating discharge: Over 30 minutes  SIGNED:   Sidney Ace, MD  [facility] 07/28/2020, 4:23 PM Pager   If 7PM-7AM, please contact night-coverage

## 2020-07-28 NOTE — Progress Notes (Signed)
Pt is A&O, VS stable, NSR on the monitor. Lap/chole sites are clean/dry/intact. Complained of pain which was relieved with norco. Up to BR. Passing gas. Spoke with Gershon Cull NP who gave her miralax to assist with having a BM. Plan is for D/C home today

## 2020-07-30 ENCOUNTER — Telehealth: Payer: Self-pay

## 2020-07-30 LAB — AEROBIC/ANAEROBIC CULTURE W GRAM STAIN (SURGICAL/DEEP WOUND): Culture: NO GROWTH

## 2020-07-30 NOTE — Telephone Encounter (Signed)
Transition Care Management Follow-up Telephone Call Date of discharge and from where: 07/28/20 [facility] How have you been since you were released from the hospital? Pt states she is still having pain steady at 7/10 but managing with medication Any questions or concerns? No  Items Reviewed: Did the pt receive and understand the discharge instructions provided? Yes  Medications obtained and verified? Yes  Other? No  Any new allergies since your discharge? No  Dietary orders reviewed? Yes Do you have support at home? Yes   Home Care and Equipment/Supplies: Were home health services ordered? no Were any new equipment or medical supplies ordered?  No  Functional Questionnaire: (I = Independent and D = Dependent) ADLs: I  Bathing/Dressing- I  Meal Prep- I  Eating- I  Maintaining continence- I  Transferring/Ambulation- I  Managing Meds- I  Follow up appointments reviewed:  PCP Hospital f/u appt confirmed? Yes  Scheduled to see Dr. Ky Barban on 08/06/20 @ 8:40. [facility] f/u appt confirmed? No  awaiting appt with cardiology Are transportation arrangements needed? No  If their condition worsens, is the pt aware to call PCP or go to the Emergency Dept.? Yes Was the patient provided with contact information for the PCP's office or ED? Yes Was to pt encouraged to call back with questions or concerns? Yes

## 2020-08-06 ENCOUNTER — Encounter: Payer: Self-pay | Admitting: Family Medicine

## 2020-08-06 ENCOUNTER — Other Ambulatory Visit: Payer: Self-pay

## 2020-08-06 ENCOUNTER — Ambulatory Visit: Payer: Managed Care, Other (non HMO) | Admitting: Family Medicine

## 2020-08-06 VITALS — BP 118/72 | HR 97 | Temp 98.0°F | Resp 16 | Ht 60.0 in | Wt 197.1 lb

## 2020-08-06 DIAGNOSIS — R112 Nausea with vomiting, unspecified: Secondary | ICD-10-CM | POA: Diagnosis not present

## 2020-08-06 DIAGNOSIS — I10 Essential (primary) hypertension: Secondary | ICD-10-CM

## 2020-08-06 NOTE — Assessment & Plan Note (Addendum)
Low normal today. Recommend monitoring at home if develops orthostatic sx and call for low readings.

## 2020-08-06 NOTE — Assessment & Plan Note (Signed)
Improved since discharge, no longer N/V. Continue prn bentyl and marinol. Note provided for work. If symptoms worsen or not improved over the next few weeks to month, consider GI referral for potential gastric emptying study.

## 2020-08-06 NOTE — Progress Notes (Signed)
    SUBJECTIVE:   CHIEF COMPLAINT / HPI:   [place] - presented with RLQ pain, N/V. Known RLQ pain since hysterectomy in 10/21. Time since discharge: 11 days Hospital/facility: [facility] 6/13-6/18 Diagnosis: intractable N/V, abd pain, sepsis with unclear etiology (possible viral gastro). Known ovarian cyst without evidence of infection. Procedures/tests:  - CT A/P - complex adnexal cyst - Pelvic US - complex ovarian cyst - diagnostic lap 6/14 - unrevealing Consultants: GYN New medications: marinol Discharge instructions:   Status: better - able to keep food down, no nausea.  - pain improved. Hasn't had to take oxy or ibuprofen. Taking Marinol, bentyl with good effect. - still with pressure feeling with urination, not new and since hysterectomy. No dysuria or hematuria. - Bms no change. Taking home colace.   Hypertension: - Medications: diovan - Compliance: good, resumed since discharge - occasional lightheadedness and dizziness. Has not been checking BP at home   OBJECTIVE:   BP 118/72   Pulse 97   Temp 98 F (36.7 C) (Oral)   Resp 16   Ht 5' (1.524 m)   Wt 197 lb 1.6 oz (89.4 kg)   LMP 11/02/2015 Comment: has been irregular  SpO2 99%   BMI 38.49 kg/m   Gen: well appearing, in NAD Abd: +BM, soft, tender around surgical incisions and in RLQ. Surgical incisions CDI.  ASSESSMENT/PLAN:   Hypertension, benign Low normal today. Recommend monitoring at home if develops orthostatic sx and call for low readings.  Intractable nausea and vomiting Improved since discharge, no longer N/V. Continue prn bentyl and marinol. Note provided for work. If symptoms worsen or not improved over the next few weeks to month, consider GI referral for potential gastric emptying study.     Myles Gip, DO

## 2020-08-06 NOTE — Patient Instructions (Signed)
It was great to see you!  Our plans for today:  - Let us know if you are still having issues in the next few weeks. We may need to send you to a GI specialist. - Keep an eye on your blood pressure and let us know if the top number falls below 100.  Take care and seek immediate care sooner if you develop any concerns.   Dr. Ky Barban

## 2020-08-09 ENCOUNTER — Encounter: Payer: Managed Care, Other (non HMO) | Admitting: Family Medicine

## 2020-08-09 NOTE — Progress Notes (Deleted)
Name: Anita Newton   MRN: [id number]    DOB: 08-06-1980   Date:08/09/2020       Progress Note  Subjective  Chief Complaint  Annual Exam  HPI  Patient presents for annual CPE.  Diet: *** Exercise: ***   Flowsheet Row Office Visit from 08/06/2020 in [facility]  AUDIT-C Score 0      Depression: Phq 9 is  {Desc; negative/positive:13464} Depression screen [facility] 2/9 08/06/2020 07/15/2019 07/13/2018 05/21/2018 04/16/2018  Decreased Interest 0 0 0 0 0  Down, Depressed, Hopeless 1 0 0 0 0  PHQ - 2 Score 1 0 0 0 0  Altered sleeping - 2 0 3 3  Tired, decreased energy - 0 0 3 3  Change in appetite - 0 0 0 0  Feeling bad or failure about yourself  - 0 0 0 0  Trouble concentrating - 0 0 0 0  Moving slowly or fidgety/restless - 0 0 0 0  Suicidal thoughts - 0 0 0 0  PHQ-9 Score - 2 0 6 6  Difficult doing work/chores - Not difficult at all - Not difficult at all Somewhat difficult   Hypertension: BP Readings from Last 3 Encounters:  08/06/20 118/72  07/28/20 119/88  03/09/20 113/72   Obesity: Wt Readings from Last 3 Encounters:  08/06/20 197 lb 1.6 oz (89.4 kg)  07/23/20 204 lb 3.2 oz (92.6 kg)  03/09/20 197 lb (89.4 kg)   BMI Readings from Last 3 Encounters:  08/06/20 38.49 kg/m  07/23/20 39.88 kg/m  03/09/20 38.47 kg/m     Vaccines:  HPV: up to at age 30 , ask insurance if age between 33-45  Shingrix: 10-64 yo and ask insurance if covered when patient above 56 yo Pneumonia: educated and discussed with patient. Flu: educated and discussed with patient.  Hep C Screening: 07/10/17 STD testing and prevention (HIV/chl/gon/syphilis): 07/24/20 Intimate partner violence:negative Sexual History : Menstrual History/LMP/Abnormal Bleeding:  Incontinence Symptoms:   Breast cancer:  - Last Mammogram: N/A - BRCA gene screening: N/A  Osteoporosis: Discussed high calcium and vitamin D supplementation, weight bearing exercises  Cervical cancer screening:  07/15/19  Skin cancer: Discussed monitoring for atypical lesions  Colorectal cancer: N/A   Lung cancer:  Low Dose CT Chest recommended if Age 31-80 years, 20 pack-year currently smoking OR have quit w/in 15years. Patient does not qualify.   ECG: 07/23/20  Advanced Care Planning: A voluntary discussion about advance care planning including the explanation and discussion of advance directives.  Discussed health care proxy and Living will, and the patient was able to identify a health care proxy as ***.  Patient does not have a living will at present time. If patient does have living will, I have requested they bring this to the clinic to be scanned in to their chart.  Lipids: Lab Results  Component Value Date   CHOL 169 07/20/2019   CHOL 150 07/20/2018   CHOL 160 07/10/2017   Lab Results  Component Value Date   HDL 43 07/20/2019   HDL 48 07/20/2018   HDL 47 07/10/2017   Lab Results  Component Value Date   LDLCALC 99 07/20/2019   LDLCALC 84 07/20/2018   LDLCALC 103 (H) 07/10/2017   Lab Results  Component Value Date   TRIG 156 (H) 07/20/2019   TRIG 92 07/20/2018   TRIG 48 07/10/2017   Lab Results  Component Value Date   CHOLHDL 3.9 07/20/2019   CHOLHDL 3.4 07/10/2017   CHOLHDL 3.9  07/08/2016   No results found for: LDLDIRECT  Glucose: Glucose, Bld  Date Value Ref Range Status  07/25/2020 143 (H) 70 - 99 mg/dL Final    Comment:    Glucose reference range applies only to samples taken after fasting for at least 8 hours.  07/24/2020 95 70 - 99 mg/dL Final    Comment:    Glucose reference range applies only to samples taken after fasting for at least 8 hours.  07/23/2020 111 (H) 70 - 99 mg/dL Final    Comment:    Glucose reference range applies only to samples taken after fasting for at least 8 hours.   Glucose-Capillary  Date Value Ref Range Status  07/24/2020 91 70 - 99 mg/dL Final    Comment:    Glucose reference range applies only to samples taken after fasting for  at least 8 hours.    Patient Active Problem List   Diagnosis Date Noted   RLQ abdominal pain 07/24/2020   Intractable nausea and vomiting 07/23/2020   Ovarian cyst 07/23/2020   Dehydration 07/23/2020   Sepsis ([place]) 07/23/2020   S/P hysterectomy 11/16/2019   S/P laparoscopic hysterectomy 11/15/2019   Menorrhagia with regular cycle    Morbid obesity ([place]) 04/16/2018   Encounter for surveillance of injectable contraceptive 02/23/2018   DUB (dysfunctional uterine bleeding) 07/16/2017   B12 deficiency 07/08/2016   History of iron deficiency anemia 07/08/2016   Seasonal allergic rhinitis 05/16/2015   History of eclampsia 12/15/2014   Hypertension, benign 12/15/2014   Large breasts 12/15/2014   Obesity (BMI 35.0-39.9 without comorbidity) 12/15/2014   Thoracic back pain 12/15/2014   Vitamin D deficiency 12/15/2014    Past Surgical History:  Procedure Laterality Date   CYSTOSCOPY N/A 11/15/2019   Procedure: CYSTOSCOPY;  Surgeon: Homero Fellers, MD;  Location: [facility] ORS;  Service: Gynecology;  Laterality: N/A;   DILATATION & CURETTAGE/HYSTEROSCOPY WITH MYOSURE N/A 10/06/2017   Procedure: DILATATION & CURETTAGE /HYSTEROSCOPY;  Surgeon: Homero Fellers, MD;  Location: [facility] ORS;  Service: Gynecology;  Laterality: N/A;   EYE SURGERY Right 2004   eyelid laceration    LAPAROSCOPIC OVARIAN CYSTECTOMY N/A 07/24/2020   Procedure: Diagnostic laparoscopy, lysis of adhesions;  Surgeon: Will Bonnet, MD;  Location: [facility] ORS;  Service: Gynecology;  Laterality: N/A;   TOTAL LAPAROSCOPIC HYSTERECTOMY WITH SALPINGECTOMY Bilateral 11/15/2019   Procedure: TOTAL LAPAROSCOPIC HYSTERECTOMY WITH SALPINGECTOMY;  Surgeon: Homero Fellers, MD;  Location: [facility] ORS;  Service: Gynecology;  Laterality: Bilateral;   TUBAL LIGATION      Family History  Problem Relation Age of Onset   Cancer Mother        eye tumor   Diabetes Mother    Lupus Mother    Alcohol abuse Father    Drug abuse  Father    Eczema Daughter    ADD / ADHD Daughter    ADD / ADHD Son    Allergic Disorder Son    Lupus Sister    Depression Sister    Hypertension Brother    Breast cancer Neg Hx     Social History   Socioeconomic History   Marital status: Single    Spouse name: Not on file   Number of children: 3   Years of education: Not on file   Highest education level: Some college, no degree  Occupational History   Not on file  Tobacco Use   Smoking status: Never   Smokeless tobacco: Never  Vaping Use   Vaping Use: Never  used  Substance and Sexual Activity   Alcohol use: Yes    Alcohol/week: 0.0 standard drinks    Comment: socially   Drug use: No   Sexual activity: Yes    Birth control/protection: Condom, Injection, Surgical    Comment: Tubal Ligation   Other Topics Concern   Not on file  Social History Narrative   Not on file   Social Determinants of Health   Financial Resource Strain: Not on file  Food Insecurity: Not on file  Transportation Needs: Not on file  Physical Activity: Not on file  Stress: Not on file  Social Connections: Not on file  Intimate Partner Violence: Not on file     Current Outpatient Medications:    ascorbic acid (VITAMIN C) 500 MG tablet, Take 500 mg by mouth daily., Disp: , Rfl:    Cyanocobalamin (B-12) 1000 MCG SUBL, Place 1 tablet under the tongue daily. (Patient taking differently: Place 1,000 mcg under the tongue daily.), Disp: 100 tablet, Rfl: 1   dicyclomine (BENTYL) 20 MG tablet, Take 1 tablet (20 mg total) by mouth 3 (three) times daily as needed for up to 7 days for spasms., Disp: 21 tablet, Rfl: 0   docusate sodium (COLACE) 100 MG capsule, Take 1 capsule (100 mg total) by mouth 2 (two) times daily as needed for mild constipation., Disp: 30 capsule, Rfl: 0   dronabinol (MARINOL) 5 MG capsule, Take 1 capsule (5 mg total) by mouth 3 times/day as needed-between meals & bedtime for up to 14 days., Disp: 28 capsule, Rfl: 0   ibuprofen  (ADVIL,MOTRIN) 600 MG tablet, Take 1 tablet (600 mg total) by mouth every 6 (six) hours as needed for cramping. (Patient taking differently: Take 1,200 mg by mouth every 6 (six) hours as needed (chronic back pain).), Disp: 60 tablet, Rfl: 0   loratadine (CLARITIN) 10 MG tablet, Take 1 tablet (10 mg total) by mouth daily as needed for allergies., Disp: 30 tablet, Rfl: 11   metoCLOPramide (REGLAN) 10 MG tablet, Take 1 tablet (10 mg total) by mouth 3 (three) times daily before meals for 14 days., Disp: 42 tablet, Rfl: 0   montelukast (SINGULAIR) 10 MG tablet, Take 1 tablet (10 mg total) by mouth at bedtime. (Patient taking differently: Take 10 mg by mouth at bedtime as needed (allergies.).), Disp: 90 tablet, Rfl: 1   Multiple Vitamin (MULTIVITAMIN WITH MINERALS) TABS tablet, Take 1 tablet by mouth daily., Disp: , Rfl:    Omega-3 Fatty Acids (FISH OIL) 1000 MG CAPS, Take 1,000 mg by mouth every Sunday. , Disp: , Rfl:    oxyCODONE (OXY IR/ROXICODONE) 5 MG immediate release tablet, Take 1 tablet (5 mg total) by mouth every 6 (six) hours as needed for moderate pain (pain score 4-7/10)., Disp: 15 tablet, Rfl: 0   pantoprazole (PROTONIX) 40 MG tablet, Take 1 tablet (40 mg total) by mouth daily., Disp: 30 tablet, Rfl: 0   simethicone (MYLICON) 80 MG chewable tablet, Chew 1 tablet (80 mg total) by mouth 4 (four) times daily., Disp: 30 tablet, Rfl: 0   valsartan-hydrochlorothiazide (DIOVAN-HCT) 160-25 MG tablet, TAKE 1 TABLET BY MOUTH EVERY DAY, Disp: 30 tablet, Rfl: 0   Vitamin D, Ergocalciferol, (DRISDOL) 1.25 MG (50000 UNIT) CAPS capsule, Take 1 capsule (50,000 Units total) by mouth every Sunday., Disp: 12 capsule, Rfl: 1  Allergies  Allergen Reactions   Cherry Hives and Swelling    Fresh cherries with a stem   Morphine And Related     Pt doesn't  like the way it makes her feel when coming off it. If has to have, will use.     ROS  ***  Objective  There were no vitals filed for this visit.  There  is no height or weight on file to calculate BMI.  Physical Exam ***  Recent Results (from the past 2160 hour(s))  Urine Drug Screen, Qualitative ([facility] only)     Status: None   Collection Time: 07/23/20 10:36 AM  Result Value Ref Range   Tricyclic, Ur Screen NONE DETECTED NONE DETECTED   Amphetamines, Ur Screen NONE DETECTED NONE DETECTED   MDMA (Ecstasy)Ur Screen NONE DETECTED NONE DETECTED   Cocaine Metabolite,Ur [place] NONE DETECTED NONE DETECTED   Opiate, Ur Screen NONE DETECTED NONE DETECTED   Phencyclidine (PCP) Ur S NONE DETECTED NONE DETECTED   Cannabinoid 50 Ng, Ur [place] NONE DETECTED NONE DETECTED   Barbiturates, Ur Screen NONE DETECTED NONE DETECTED   Benzodiazepine, Ur Scrn NONE DETECTED NONE DETECTED   Methadone Scn, Ur NONE DETECTED NONE DETECTED    Comment: (NOTE) Tricyclics + metabolites, urine    Cutoff 1000 ng/mL Amphetamines + metabolites, urine  Cutoff 1000 ng/mL MDMA (Ecstasy), urine              Cutoff 500 ng/mL Cocaine Metabolite, urine          Cutoff 300 ng/mL Opiate + metabolites, urine        Cutoff 300 ng/mL Phencyclidine (PCP), urine         Cutoff 25 ng/mL Cannabinoid, urine                 Cutoff 50 ng/mL Barbiturates + metabolites, urine  Cutoff 200 ng/mL Benzodiazepine, urine              Cutoff 200 ng/mL Methadone, urine                   Cutoff 300 ng/mL  The urine drug screen provides only a preliminary, unconfirmed analytical test result and should not be used for non-medical purposes. Clinical consideration and professional judgment should be applied to any positive drug screen result due to possible interfering substances. A more specific alternate chemical method must be used in order to obtain a confirmed analytical result. Gas chromatography / mass spectrometry (GC/MS) is the preferred confirm atory method. Performed at [facility], [address] [place], [place] [address]   Lipase, blood     Status: None   Collection Time:  07/23/20 10:50 AM  Result Value Ref Range   Lipase 30 11 - 51 U/L    Comment: Performed at [facility], [address] [place], [place] [address]  Comprehensive metabolic panel     Status: Abnormal   Collection Time: 07/23/20 10:50 AM  Result Value Ref Range   Sodium 135 135 - 145 mmol/L   Potassium 3.9 3.5 - 5.1 mmol/L   Chloride 102 98 - 111 mmol/L   CO2 25 22 - 32 mmol/L   Glucose, Bld 111 (H) 70 - 99 mg/dL    Comment: Glucose reference range applies only to samples taken after fasting for at least 8 hours.   BUN 15 6 - 20 mg/dL   Creatinine, Ser 0.63 0.44 - 1.00 mg/dL   Calcium 9.0 8.9 - 10.3 mg/dL   Total Protein 8.0 6.5 - 8.1 g/dL   Albumin 4.1 3.5 - 5.0 g/dL   AST 28 15 - 41 U/L   ALT 36 0 -  44 U/L   Alkaline Phosphatase 64 38 - 126 U/L   Total Bilirubin 0.8 0.3 - 1.2 mg/dL   GFR, Estimated >60 >60 mL/min    Comment: (NOTE) Calculated using the CKD-EPI Creatinine Equation (2021)    Anion gap 8 5 - 15    Comment: Performed at [facility], [address] [place], [place] [address]  CBC     Status: None   Collection Time: 07/23/20 10:50 AM  Result Value Ref Range   WBC 9.2 4.0 - 10.5 K/uL   RBC 4.32 3.87 - 5.11 MIL/uL   Hemoglobin 12.9 12.0 - 15.0 g/dL   HCT 38.0 36.0 - 46.0 %   MCV 88.0 80.0 - 100.0 fL   MCH 29.9 26.0 - 34.0 pg   MCHC 33.9 30.0 - 36.0 g/dL   RDW 12.1 11.5 - 15.5 %   Platelets 330 150 - 400 K/uL   nRBC 0.0 0.0 - 0.2 %    Comment: Performed at [facility], [place], [place] [address]  Troponin I (High Sensitivity)     Status: None   Collection Time: 07/23/20 10:50 AM  Result Value Ref Range   Troponin I (High Sensitivity) 3 <18 ng/L    Comment: (NOTE) Elevated high sensitivity troponin I (hsTnI) values and significant  changes across serial measurements may suggest ACS but many other  chronic and acute conditions are known to elevate hsTnI results.  Refer to the "Links" section for chest pain  algorithms and additional  guidance. Performed at [facility], [address] [place], [place] [address]   CBC with Differential/Platelet     Status: Abnormal   Collection Time: 07/23/20 10:50 AM  Result Value Ref Range   WBC 9.4 4.0 - 10.5 K/uL   RBC 4.21 3.87 - 5.11 MIL/uL   Hemoglobin 12.5 12.0 - 15.0 g/dL   HCT 38.3 36.0 - 46.0 %   MCV 91.0 80.0 - 100.0 fL   MCH 29.7 26.0 - 34.0 pg   MCHC 32.6 30.0 - 36.0 g/dL   RDW 12.5 11.5 - 15.5 %   Platelets 347 150 - 400 K/uL   nRBC 0.0 0.0 - 0.2 %   Neutrophils Relative % 87 %   Neutro Abs 8.1 (H) 1.7 - 7.7 K/uL   Lymphocytes Relative 7 %   Lymphs Abs 0.6 (L) 0.7 - 4.0 K/uL   Monocytes Relative 5 %   Monocytes Absolute 0.5 0.1 - 1.0 K/uL   Eosinophils Relative 1 %   Eosinophils Absolute 0.1 0.0 - 0.5 K/uL   Basophils Relative 0 %   Basophils Absolute 0.0 0.0 - 0.1 K/uL   Immature Granulocytes 0 %   Abs Immature Granulocytes 0.04 0.00 - 0.07 K/uL    Comment: Performed at [facility], [address] [place], [place] [address]  Urinalysis, Complete w Microscopic Nasopharyngeal Swab     Status: Abnormal   Collection Time: 07/23/20 11:56 AM  Result Value Ref Range   Color, Urine YELLOW (A) YELLOW   APPearance HAZY (A) CLEAR   Specific Gravity, Urine 1.027 1.005 - 1.030   pH 6.0 5.0 - 8.0   Glucose, UA NEGATIVE NEGATIVE mg/dL   Hgb urine dipstick NEGATIVE NEGATIVE   Bilirubin Urine NEGATIVE NEGATIVE   Ketones, ur NEGATIVE NEGATIVE mg/dL   Protein, ur NEGATIVE NEGATIVE mg/dL   Nitrite NEGATIVE NEGATIVE   Leukocytes,Ua NEGATIVE NEGATIVE   RBC / HPF 0-5 0 - 5 RBC/hpf   WBC, UA  0-5 0 - 5 WBC/hpf   Bacteria, UA NONE SEEN NONE SEEN   Squamous Epithelial / LPF 6-10 0 - 5   Mucus PRESENT     Comment: Performed at [facility], [address] [place], [place] [address]  Resp Panel by RT-PCR (Flu A&B, Covid) Nasopharyngeal Swab     Status: None   Collection Time: 07/23/20 11:56 AM   Specimen:  Nasopharyngeal Swab; Nasopharyngeal(NP) swabs in vial transport medium  Result Value Ref Range   SARS Coronavirus 2 by RT PCR NEGATIVE NEGATIVE    Comment: (NOTE) SARS-CoV-2 target nucleic acids are NOT DETECTED.  The SARS-CoV-2 RNA is generally detectable in upper respiratory specimens during the acute phase of infection. The lowest concentration of SARS-CoV-2 viral copies this assay can detect is 138 copies/mL. A negative result does not preclude SARS-Cov-2 infection and should not be used as the sole basis for treatment or other patient management decisions. A negative result may occur with  improper specimen collection/handling, submission of specimen other than nasopharyngeal swab, presence of viral mutation(s) within the areas targeted by this assay, and inadequate number of viral copies(<138 copies/mL). A negative result must be combined with clinical observations, patient history, and epidemiological information. The expected result is Negative.  Fact Sheet for Patients:  BloggerCourse.com  Fact Sheet for Healthcare Providers:  SeriousBroker.it  This test is no t yet approved or cleared by the [place] FDA and  has been authorized for detection and/or diagnosis of SARS-CoV-2 by FDA under an Emergency Use Authorization (EUA). This EUA will remain  in effect (meaning this test can be used) for the duration of the COVID-19 declaration under Section 564(b)(1) of the Act, 21 U.S.C.section 360bbb-3(b)(1), unless the authorization is terminated  or revoked sooner.       Influenza A by PCR NEGATIVE NEGATIVE   Influenza B by PCR NEGATIVE NEGATIVE    Comment: (NOTE) The Xpert Xpress SARS-CoV-2/FLU/RSV plus assay is intended as an aid in the diagnosis of influenza from Nasopharyngeal swab specimens and should not be used as a sole basis for treatment. Nasal washings and aspirates are unacceptable for Xpert Xpress  SARS-CoV-2/FLU/RSV testing.  Fact Sheet for Patients: BloggerCourse.com  Fact Sheet for Healthcare Providers: SeriousBroker.it  This test is not yet approved or cleared by the [place] FDA and has been authorized for detection and/or diagnosis of SARS-CoV-2 by FDA under an Emergency Use Authorization (EUA). This EUA will remain in effect (meaning this test can be used) for the duration of the COVID-19 declaration under Section 564(b)(1) of the Act, 21 U.S.C. section 360bbb-3(b)(1), unless the authorization is terminated or revoked.  Performed at [facility], [address] [place], [place] [address]   Pregnancy, urine     Status: None   Collection Time: 07/23/20 11:56 AM  Result Value Ref Range   Preg Test, Ur NEGATIVE NEGATIVE    Comment: Performed at [facility], [address] [place], [place] [address]  TSH     Status: None   Collection Time: 07/23/20  4:29 PM  Result Value Ref Range   TSH 0.699 0.350 - 4.500 uIU/mL    Comment: Performed by a 3rd Generation assay with a functional sensitivity of <=0.01 uIU/mL. Performed at [facility], [address] [place], [place] [address]   T4, free     Status: None   Collection Time: 07/23/20  4:29 PM  Result Value Ref Range   Free T4 0.93 0.61 - 1.12 ng/dL  Comment: (NOTE) Biotin ingestion may interfere with free T4 tests. If the results are inconsistent with the TSH level, previous test results, or the clinical presentation, then consider biotin interference. If needed, order repeat testing after stopping biotin. Performed at [facility], [address] [place], [place] [address]   D-dimer, quantitative     Status: Abnormal   Collection Time: 07/23/20  8:20 PM  Result Value Ref Range   D-Dimer, Quant 0.66 (H) 0.00 - 0.50 ug/mL-FEU    Comment: (NOTE) At the manufacturer cut-off value of 0.5 g/mL FEU, this assay has  a negative predictive value of 95-100%.This assay is intended for use in conjunction with a clinical pretest probability (PTP) assessment model to exclude pulmonary embolism (PE) and deep venous thrombosis (DVT) in outpatients suspected of PE or DVT. Results should be correlated with clinical presentation. Performed at [facility], [address], [place], [place] [address]   Magnesium     Status: Abnormal   Collection Time: 07/23/20  8:20 PM  Result Value Ref Range   Magnesium 1.4 (L) 1.7 - 2.4 mg/dL    Comment: Performed at [facility], [address] [place], [place] [address]  Phosphorus     Status: None   Collection Time: 07/23/20  8:20 PM  Result Value Ref Range   Phosphorus 3.2 2.5 - 4.6 mg/dL    Comment: Performed at [facility], [address] [place], [place] [address]  CK     Status: None   Collection Time: 07/23/20  8:20 PM  Result Value Ref Range   Total CK 53 38 - 234 U/L    Comment: Performed at [facility], [address] [place], [place] [address]  Lactic acid, plasma     Status: None   Collection Time: 07/23/20  8:20 PM  Result Value Ref Range   Lactic Acid, Venous 1.1 0.5 - 1.9 mmol/L    Comment: Performed at [facility], [address] [place], [place] [address]  CULTURE, BLOOD (ROUTINE X 2) w Reflex to ID Panel     Status: None   Collection Time: 07/23/20  9:47 PM   Specimen: BLOOD  Result Value Ref Range   Specimen Description BLOOD BLOOD RIGHT HAND    Special Requests      BOTTLES DRAWN AEROBIC AND ANAEROBIC Blood Culture adequate volume   Culture      NO GROWTH 5 DAYS Performed at [facility], [address] [place], [place] [address]    Report Status 07/28/2020 FINAL   Procalcitonin     Status: None   Collection Time: 07/23/20  9:47 PM  Result Value Ref Range   Procalcitonin <0.10 ng/mL    Comment:        Interpretation: PCT (Procalcitonin) <= 0.5 ng/mL: Systemic infection  (sepsis) is not likely. Local bacterial infection is possible. (NOTE)       Sepsis PCT Algorithm           Lower Respiratory Tract                                      Infection PCT Algorithm    ----------------------------     ----------------------------         PCT < 0.25 ng/mL                PCT < 0.10 ng/mL  Strongly encourage             Strongly discourage   discontinuation of antibiotics    initiation of antibiotics    ----------------------------     -----------------------------       PCT 0.25 - 0.50 ng/mL            PCT 0.10 - 0.25 ng/mL               OR       >80% decrease in PCT            Discourage initiation of                                            antibiotics      Encourage discontinuation           of antibiotics    ----------------------------     -----------------------------         PCT >= 0.50 ng/mL              PCT 0.26 - 0.50 ng/mL               AND        <80% decrease in PCT             Encourage initiation of                                             antibiotics       Encourage continuation           of antibiotics    ----------------------------     -----------------------------        PCT >= 0.50 ng/mL                  PCT > 0.50 ng/mL               AND         increase in PCT                  Strongly encourage                                      initiation of antibiotics    Strongly encourage escalation           of antibiotics                                     -----------------------------                                           PCT <= 0.25 ng/mL                                                 OR                                        >   80% decrease in PCT                                      Discontinue / Do not initiate                                             antibiotics  Performed at [facility], [address] [place], [place] [address]   CULTURE, BLOOD (ROUTINE X 2) w Reflex to ID Panel     Status: None    Collection Time: 07/23/20  9:51 PM   Specimen: BLOOD  Result Value Ref Range   Specimen Description BLOOD BLOOD LEFT HAND    Special Requests      BOTTLES DRAWN AEROBIC AND ANAEROBIC Blood Culture adequate volume   Culture      NO GROWTH 5 DAYS Performed at [facility], [address], [place], [place] [address]    Report Status 07/28/2020 FINAL   Glucose, capillary     Status: None   Collection Time: 07/24/20 12:50 AM  Result Value Ref Range   Glucose-Capillary 91 70 - 99 mg/dL    Comment: Glucose reference range applies only to samples taken after fasting for at least 8 hours.  HIV Antibody (routine testing w rflx)     Status: None   Collection Time: 07/24/20  5:32 AM  Result Value Ref Range   HIV Screen 4th Generation wRfx Non Reactive Non Reactive    Comment: Performed at [facility], [address], [place], [place] [address]  Magnesium     Status: Abnormal   Collection Time: 07/24/20  5:32 AM  Result Value Ref Range   Magnesium 1.5 (L) 1.7 - 2.4 mg/dL    Comment: Performed at [facility], [address] [place], [place] [address]  Phosphorus     Status: None   Collection Time: 07/24/20  5:32 AM  Result Value Ref Range   Phosphorus 2.6 2.5 - 4.6 mg/dL    Comment: Performed at [facility], [address] [place], [place] [address]  CBC WITH DIFFERENTIAL     Status: Abnormal   Collection Time: 07/24/20  5:32 AM  Result Value Ref Range   WBC 5.1 4.0 - 10.5 K/uL   RBC 3.40 (L) 3.87 - 5.11 MIL/uL   Hemoglobin 10.1 (L) 12.0 - 15.0 g/dL   HCT 29.9 (L) 36.0 - 46.0 %   MCV 87.9 80.0 - 100.0 fL   MCH 29.7 26.0 - 34.0 pg   MCHC 33.8 30.0 - 36.0 g/dL   RDW 12.1 11.5 - 15.5 %   Platelets 270 150 - 400 K/uL   nRBC 0.0 0.0 - 0.2 %   Neutrophils Relative % 81 %   Neutro Abs 4.1 1.7 - 7.7 K/uL   Lymphocytes Relative 13 %   Lymphs Abs 0.7 0.7 - 4.0 K/uL   Monocytes Relative 6 %   Monocytes Absolute 0.3 0.1 - 1.0 K/uL   Eosinophils  Relative 0 %   Eosinophils Absolute 0.0 0.0 - 0.5 K/uL   Basophils Relative 0 %   Basophils Absolute 0.0 0.0 - 0.1 K/uL   Immature Granulocytes 0 %   Abs Immature Granulocytes 0.01 0.00 - 0.07 K/uL    Comment: Performed at [facility],  [place], [place] [address]  TSH     Status: Abnormal   Collection Time: 07/24/20  5:32 AM  Result Value Ref Range   TSH 0.324 (L) 0.350 - 4.500 uIU/mL    Comment: Performed by a 3rd Generation assay with a functional sensitivity of <=0.01 uIU/mL. Performed at [facility], [address] [place], [place] [address]   Comprehensive metabolic panel     Status: Abnormal   Collection Time: 07/24/20  5:32 AM  Result Value Ref Range   Sodium 135 135 - 145 mmol/L   Potassium 2.9 (L) 3.5 - 5.1 mmol/L   Chloride 106 98 - 111 mmol/L   CO2 25 22 - 32 mmol/L   Glucose, Bld 95 70 - 99 mg/dL    Comment: Glucose reference range applies only to samples taken after fasting for at least 8 hours.   BUN 11 6 - 20 mg/dL   Creatinine, Ser 0.65 0.44 - 1.00 mg/dL   Calcium 7.9 (L) 8.9 - 10.3 mg/dL   Total Protein 6.1 (L) 6.5 - 8.1 g/dL   Albumin 3.2 (L) 3.5 - 5.0 g/dL   AST 18 15 - 41 U/L   ALT 26 0 - 44 U/L   Alkaline Phosphatase 55 38 - 126 U/L   Total Bilirubin 1.0 0.3 - 1.2 mg/dL   GFR, Estimated >60 >60 mL/min    Comment: (NOTE) Calculated using the CKD-EPI Creatinine Equation (2021)    Anion gap 4 (L) 5 - 15    Comment: Performed at [facility], [address], [place], [place] [address]  Potassium     Status: Abnormal   Collection Time: 07/24/20  7:48 PM  Result Value Ref Range   Potassium 3.2 (L) 3.5 - 5.1 mmol/L    Comment: Performed at [facility], [address] [place], [place] [address]  Aerobic/Anaerobic Culture w Gram Stain (surgical/deep wound)     Status: None   Collection Time: 07/24/20  9:56 PM   Specimen: PATH Other; Wound  Result Value Ref Range   Specimen Description PERITONEAL  FLUID    Special Requests      CUP PATIENT ON FOLLOWING CEFIPIME,FLAGYL,VANCOMYCIN   Gram Stain      RARE WBC PRESENT, PREDOMINANTLY MONONUCLEAR NO ORGANISMS SEEN    Culture      No growth aerobically or anaerobically. Performed at [facility], [place] [address], [place], [place] [address]    Report Status 07/30/2020 FINAL   Magnesium     Status: None   Collection Time: 07/25/20  6:32 AM  Result Value Ref Range   Magnesium 1.9 1.7 - 2.4 mg/dL    Comment: Performed at [facility], [address] [place], [place] [address]  Basic metabolic panel     Status: Abnormal   Collection Time: 07/25/20  6:32 AM  Result Value Ref Range   Sodium 139 135 - 145 mmol/L   Potassium 3.8 3.5 - 5.1 mmol/L   Chloride 105 98 - 111 mmol/L   CO2 25 22 - 32 mmol/L   Glucose, Bld 143 (H) 70 - 99 mg/dL    Comment: Glucose reference range applies only to samples taken after fasting for at least 8 hours.   BUN 6 6 - 20 mg/dL   Creatinine, Ser 0.63 0.44 - 1.00 mg/dL   Calcium 8.4 (L) 8.9 - 10.3 mg/dL   GFR, Estimated >60 >60 mL/min    Comment: (NOTE) Calculated using the CKD-EPI Creatinine  Equation (2021)    Anion gap 9 5 - 15    Comment: Performed at [facility], [address] [place], [place] [address]  CBC with Differential/Platelet     Status: Abnormal   Collection Time: 07/25/20  6:32 AM  Result Value Ref Range   WBC 6.6 4.0 - 10.5 K/uL   RBC 3.50 (L) 3.87 - 5.11 MIL/uL   Hemoglobin 10.3 (L) 12.0 - 15.0 g/dL   HCT 30.2 (L) 36.0 - 46.0 %   MCV 86.3 80.0 - 100.0 fL   MCH 29.4 26.0 - 34.0 pg   MCHC 34.1 30.0 - 36.0 g/dL   RDW 12.1 11.5 - 15.5 %   Platelets 288 150 - 400 K/uL   nRBC 0.0 0.0 - 0.2 %   Neutrophils Relative % 85 %   Neutro Abs 5.6 1.7 - 7.7 K/uL   Lymphocytes Relative 10 %   Lymphs Abs 0.7 0.7 - 4.0 K/uL   Monocytes Relative 5 %   Monocytes Absolute 0.3 0.1 - 1.0 K/uL   Eosinophils Relative 0 %   Eosinophils Absolute 0.0 0.0 - 0.5 K/uL   Basophils  Relative 0 %   Basophils Absolute 0.0 0.0 - 0.1 K/uL   Immature Granulocytes 0 %   Abs Immature Granulocytes 0.02 0.00 - 0.07 K/uL    Comment: Performed at [facility], [address] [place], [place] [address]    Diabetic Foot Exam: Diabetic Foot Exam - Simple   No data filed    ***  Fall Risk: Fall Risk  08/06/2020 07/15/2019 07/13/2018 05/21/2018 04/16/2018  Falls in the past year? $RemoveBe'1 1 1 'yFEPTCPAH$ 0 1  Number falls in past yr: 0 0 1 0 1  Injury with Fall? $RemoveBe'1 1 1 'zdzUwNjlj$ 0 1  Risk for fall due to : History of fall(s) - - - -  Follow up Falls evaluation completed - - - -   ***  Functional Status Survey:   ***  Assessment & Plan  1. Well adult exam ***   -USPSTF grade A and B recommendations reviewed with patient; age-appropriate recommendations, preventive care, screening tests, etc discussed and encouraged; healthy living encouraged; see AVS for patient education given to patient -Discussed importance of 150 minutes of physical activity weekly, eat two servings of fish weekly, eat one serving of tree nuts ( cashews, pistachios, pecans, almonds.Marland Kitchen) every other day, eat 6 servings of fruit/vegetables daily and drink plenty of water and avoid sweet beverages.   -Reviewed Health Maintenance: ***

## 2020-08-10 ENCOUNTER — Other Ambulatory Visit: Payer: Self-pay | Admitting: Family Medicine

## 2020-08-10 DIAGNOSIS — I10 Essential (primary) hypertension: Secondary | ICD-10-CM

## 2020-08-10 MED ORDER — VALSARTAN-HYDROCHLOROTHIAZIDE 160-25 MG PO TABS: 1.0000 | ORAL_TABLET | Freq: Every day | ORAL | 0 refills | Status: DC

## 2020-08-10 MED ORDER — DOCUSATE SODIUM 100 MG PO CAPS: 100.0000 mg | ORAL_CAPSULE | Freq: Two times a day (BID) | ORAL | 0 refills | Status: DC | PRN

## 2020-08-10 MED ORDER — DICYCLOMINE HCL 20 MG PO TABS: 20.0000 mg | ORAL_TABLET | Freq: Three times a day (TID) | ORAL | 0 refills | Status: DC | PRN

## 2020-08-10 MED ORDER — PANTOPRAZOLE SODIUM 40 MG PO TBEC: 40.0000 mg | DELAYED_RELEASE_TABLET | Freq: Every day | ORAL | 0 refills | Status: DC

## 2020-08-10 MED ORDER — METOCLOPRAMIDE HCL 10 MG PO TABS: 10.0000 mg | ORAL_TABLET | Freq: Three times a day (TID) | ORAL | 0 refills | Status: DC

## 2020-08-10 MED ORDER — DRONABINOL 5 MG PO CAPS: 5.0000 mg | ORAL_CAPSULE | Freq: Two times a day (BID) | ORAL | 0 refills | Status: AC | PRN

## 2020-08-27 ENCOUNTER — Telehealth: Payer: Self-pay | Admitting: Family Medicine

## 2020-08-27 NOTE — Telephone Encounter (Signed)
Requested medication (s) are due for refill today: expired medications, prescribed for specific days   Requested medication (s) are on the active medication list: not marinol  Last refill:    Future visit scheduled: yes in 3 weeks   Notes to clinic:  interface surescript requests. Medications expired for reglan, colace, and bentyl. Marinol not on med list . Medication not assigned to a protocol . Protonix , expires 09/09/20.     Requested Prescriptions  Pending Prescriptions Disp Refills   pantoprazole (PROTONIX) 40 MG tablet [Pharmacy Med Name: PANTOPRAZOLE SOD DR 40 MG TAB] 90 tablet 0    Sig: TAKE 1 TABLET BY MOUTH EVERY DAY      Gastroenterology: Proton Pump Inhibitors Passed - 08/27/2020  5:33 PM      Passed - Valid encounter within last 12 months    Recent Outpatient Visits           3 weeks ago Hypertension, benign   [facility] Myles Gip, DO   1 year ago Morbid obesity with BMI of 40.0-44.9, adult [facility])   [facility] Steele Sizer, MD   2 years ago Well woman exam with routine gynecological exam   [facility] Steele Sizer, MD   2 years ago Hypertension, benign   [facility] [place], Drue Stager, MD   2 years ago Morbid obesity [facility])   [facility] Steele Sizer, MD       Future Appointments             In 3 weeks Steele Sizer, MD [facility]               metoCLOPramide (REGLAN) 10 MG tablet [Pharmacy Med Name: METOCLOPRAMIDE 10 MG TABLET] 42 tablet 0    Sig: Take 1 tablet (10 mg total) by mouth 3 (three) times daily before meals for 14 days.      Not Delegated - Gastroenterology: Antiemetics Failed - 08/27/2020  5:33 PM      Failed - This refill cannot be delegated      Passed - Valid encounter within last 6 months    Recent Outpatient Visits           3 weeks ago Hypertension, benign   [facility]  Myles Gip, DO   1 year ago Morbid obesity with BMI of 40.0-44.9, adult [facility])   [facility] Steele Sizer, MD   2 years ago Well woman exam with routine gynecological exam   [facility] Steele Sizer, MD   2 years ago Hypertension, benign   [facility] [place], Drue Stager, MD   2 years ago Morbid obesity [facility])   [facility] Steele Sizer, MD       Future Appointments             In 3 weeks Steele Sizer, MD [facility], [facility]               docusate sodium (COLACE) 100 MG capsule [Pharmacy Med Name: DOCUSATE SODIUM 100 MG SOFTGEL] 30 capsule 0    Sig: Take 1 capsule (100 mg total) by mouth 2 (two) times daily as needed for mild constipation.      Over the Counter:  OTC Passed - 08/27/2020  5:33 PM      Passed - Valid encounter within last 12 months    Recent Outpatient Visits  3 weeks ago Hypertension, benign   [facility] Myles Gip, DO   1 year ago Morbid obesity with BMI of 40.0-44.9, adult [facility])   [facility] Steele Sizer, MD   2 years ago Well woman exam with routine gynecological exam   [facility] Steele Sizer, MD   2 years ago Hypertension, benign   [facility] [place], Drue Stager, MD   2 years ago Morbid obesity [facility])   [facility] Steele Sizer, MD       Future Appointments             In 3 weeks Steele Sizer, MD [facility], [facility]               dicyclomine (BENTYL) 20 MG tablet [Pharmacy Med Name: DICYCLOMINE 20 MG TABLET] 21 tablet 0    Sig: Take 1 tablet (20 mg total) by mouth 3 (three) times daily as needed for up to 7 days for spasms.      Gastroenterology:  Antispasmodic Agents Passed - 08/27/2020  5:33 PM      Passed - Last Heart Rate in normal range    Pulse Readings from Last 1 Encounters:   08/06/20 97          Passed - Valid encounter within last 12 months    Recent Outpatient Visits           3 weeks ago Hypertension, benign   [facility] [place], Jake Church, DO   1 year ago Morbid obesity with BMI of 40.0-44.9, adult [facility])   [facility] Steele Sizer, MD   2 years ago Well woman exam with routine gynecological exam   [facility] Steele Sizer, MD   2 years ago Hypertension, benign   [facility] [place], Drue Stager, MD   2 years ago Morbid obesity [facility])   [facility] Steele Sizer, MD       Future Appointments             In 3 weeks Steele Sizer, MD [facility]               dronabinol (MARINOL) 5 MG capsule [Pharmacy Med Name: DRONABINOL 5 MG CAPSULE] 28 capsule 0    Sig: Take 1 capsule (5 mg total) by mouth 3 times/day as needed-between meals & bedtime for up to 14 days.      Off-Protocol Failed - 08/27/2020  5:33 PM      Failed - Medication not assigned to a protocol, review manually.      Passed - Valid encounter within last 12 months    Recent Outpatient Visits           3 weeks ago Hypertension, benign   [place], DO   1 year ago Morbid obesity with BMI of 40.0-44.9, adult [facility])   [facility] Steele Sizer, MD   2 years ago Well woman exam with routine gynecological exam   [facility] Steele Sizer, MD   2 years ago Hypertension, benign   [facility] Steele Sizer, MD   2 years ago Morbid obesity [facility])   [facility] Steele Sizer, MD       Future Appointments             In 3 weeks Steele Sizer, MD [facility]  [place]

## 2020-08-28 NOTE — Telephone Encounter (Signed)
Pt has an appt on 09/21/20

## 2020-08-29 NOTE — Telephone Encounter (Signed)
Pts next appt is 09/21/20

## 2020-09-11 ENCOUNTER — Other Ambulatory Visit: Payer: Self-pay | Admitting: Family Medicine

## 2020-09-11 ENCOUNTER — Other Ambulatory Visit: Payer: Self-pay | Admitting: Obstetrics and Gynecology

## 2020-09-11 DIAGNOSIS — E559 Vitamin D deficiency, unspecified: Secondary | ICD-10-CM

## 2020-09-11 NOTE — Telephone Encounter (Signed)
Last seen 6.27.2022 upcoming appt sch'd for 8.12.2022

## 2020-09-11 NOTE — Telephone Encounter (Signed)
Next appt is 09/21/20

## 2020-09-11 NOTE — Telephone Encounter (Signed)
Requested medication (s) are due for refill today: yes  Requested medication (s) are on the active medication list: no  Last refill:  08/10/20 #42  Future visit scheduled: yes  Notes to clinic:  med not delegated to NT to RF   Requested Prescriptions  Pending Prescriptions Disp Refills   metoCLOPramide (REGLAN) 10 MG tablet [Pharmacy Med Name: METOCLOPRAMIDE 10 MG TABLET] 42 tablet 0    Sig: Take 1 tablet (10 mg total) by mouth 3 (three) times daily before meals for 14 days.      Not Delegated - Gastroenterology: Antiemetics Failed - 09/11/2020  7:30 AM      Failed - This refill cannot be delegated      Passed - Valid encounter within last 6 months    Recent Outpatient Visits           1 month ago Hypertension, benign   [facility] Myles Gip, DO   1 year ago Morbid obesity with BMI of 40.0-44.9, adult [facility])   [facility] Steele Sizer, MD   2 years ago Well woman exam with routine gynecological exam   [facility] Sizer, MD   2 years ago Hypertension, benign   [facility] Steele Sizer, MD   2 years ago Morbid obesity [facility])   [facility] Steele Sizer, MD       Future Appointments             In 1 week Steele Sizer, MD [facility], [facility]

## 2020-09-20 NOTE — Patient Instructions (Signed)
Preventive Care 41-41 Years Old, Female Preventive care refers to lifestyle choices and visits with your health care provider that can promote health and wellness. This includes: A yearly physical exam. This is also called an annual wellness visit. Regular dental and eye exams. Immunizations. Screening for certain conditions. Healthy lifestyle choices, such as: Eating a healthy diet. Getting regular exercise. Not using drugs or products that contain nicotine and tobacco. Limiting alcohol use. What can I expect for my preventive care visit? Physical exam Your health care provider will check your: Height and weight. These may be used to calculate your BMI (body mass index). BMI is a measurement that tells if you are at a healthy weight. Heart rate and blood pressure. Body temperature. Skin for abnormal spots. Counseling Your health care provider may ask you questions about your: Past medical problems. Family's medical history. Alcohol, tobacco, and drug use. Emotional well-being. Home life and relationship well-being. Sexual activity. Diet, exercise, and sleep habits. Work and work Statistician. Access to firearms. Method of birth control. Menstrual cycle. Pregnancy history. What immunizations do I need?  Vaccines are usually given at various ages, according to a schedule. Your health care provider will recommend vaccines for you based on your age, medicalhistory, and lifestyle or other factors, such as travel or where you work. What tests do I need? Blood tests Lipid and cholesterol levels. These may be checked every 5 years, or more often if you are over 37 years old. Hepatitis C test. Hepatitis B test. Screening Lung cancer screening. You may have this screening every year starting at age 30 if you have a 30-pack-year history of smoking and currently smoke or have quit within the past 15 years. Colorectal cancer screening. All adults should have this screening starting at  age 23 and continuing until age 3. Your health care provider may recommend screening at age 88 if you are at increased risk. You will have tests every 1-10 years, depending on your results and the type of screening test. Diabetes screening. This is done by checking your blood sugar (glucose) after you have not eaten for a while (fasting). You may have this done every 1-3 years. Mammogram. This may be done every 1-2 years. Talk with your health care provider about when you should start having regular mammograms. This may depend on whether you have a family history of breast cancer. BRCA-related cancer screening. This may be done if you have a family history of breast, ovarian, tubal, or peritoneal cancers. Pelvic exam and Pap test. This may be done every 3 years starting at age 79. Starting at age 54, this may be done every 5 years if you have a Pap test in combination with an HPV test. Other tests STD (sexually transmitted disease) testing, if you are at risk. Bone density scan. This is done to screen for osteoporosis. You may have this scan if you are at high risk for osteoporosis. Talk with your health care provider about your test results, treatment options,and if necessary, the need for more tests. Follow these instructions at home: Eating and drinking  Eat a diet that includes fresh fruits and vegetables, whole grains, lean protein, and low-fat dairy products. Take vitamin and mineral supplements as recommended by your health care provider. Do not drink alcohol if: Your health care provider tells you not to drink. You are pregnant, may be pregnant, or are planning to become pregnant. If you drink alcohol: Limit how much you have to 0-1 drink a day. Be aware  of how much alcohol is in your drink. In the [place] one drink equals one 12 oz bottle of beer (355 mL), one 5 oz glass of wine (148 mL), or one 1 oz glass of hard liquor (44 mL).  Lifestyle Take daily care of your teeth and  gums. Brush your teeth every morning and night with fluoride toothpaste. Floss one time each day. Stay active. Exercise for at least 30 minutes 5 or more days each week. Do not use any products that contain nicotine or tobacco, such as cigarettes, e-cigarettes, and chewing tobacco. If you need help quitting, ask your health care provider. Do not use drugs. If you are sexually active, practice safe sex. Use a condom or other form of protection to prevent STIs (sexually transmitted infections). If you do not wish to become pregnant, use a form of birth control. If you plan to become pregnant, see your health care provider for a prepregnancy visit. If told by your health care provider, take low-dose aspirin daily starting at age 29. Find healthy ways to cope with stress, such as: Meditation, yoga, or listening to music. Journaling. Talking to a trusted person. Spending time with friends and family. Safety Always wear your seat belt while driving or riding in a vehicle. Do not drive: If you have been drinking alcohol. Do not ride with someone who has been drinking. When you are tired or distracted. While texting. Wear a helmet and other protective equipment during sports activities. If you have firearms in your house, make sure you follow all gun safety procedures. What's next? Visit your health care provider once a year for an annual wellness visit. Ask your health care provider how often you should have your eyes and teeth checked. Stay up to date on all vaccines. This information is not intended to replace advice given to you by your health care provider. Make sure you discuss any questions you have with your healthcare provider. Document Revised: 11/01/2019 Document Reviewed: 10/08/2017 Elsevier Patient Education  2022 [employer].

## 2020-09-20 NOTE — Progress Notes (Signed)
Name: Anita Newton   MRN: [id number]    DOB: [birth year]   Date:09/21/2020       Progress Note  Subjective  Chief Complaint  Annual Exam  HPI  Patient presents for annual CPE   She does not want to discuss other medication problems  Placing referral to gyn due to complex ovarian cyst and still having RLQ pain   Diet: low in carb, lean meat and has vegetables with every  meal  Exercise:  needs to increase to 30 minutes 5 days a week   [place] Office Visit from 08/06/2020 in [facility]  AUDIT-C Score 0      Depression: Phq 9 is  positive but she refuses medication   Depression screen [facility] 2/9 09/21/2020 08/06/2020 07/15/2019 07/13/2018 05/21/2018  Decreased Interest 1 0 0 0 0  Down, Depressed, Hopeless 2 1 0 0 0  PHQ - 2 Score 3 1 0 0 0  Altered sleeping 3 - 2 0 3  Tired, decreased energy 3 - 0 0 3  Change in appetite 0 - 0 0 0  Feeling bad or failure about yourself  1 - 0 0 0  Trouble concentrating 3 - 0 0 0  Moving slowly or fidgety/restless 0 - 0 0 0  Suicidal thoughts 0 - 0 0 0  PHQ-9 Score 13 - 2 0 6  Difficult doing work/chores - - Not difficult at all - Not difficult at all  Some recent data might be hidden   Hypertension: BP Readings from Last 3 Encounters:  09/21/20 132/70  08/06/20 118/72  07/28/20 119/88   Obesity: Wt Readings from Last 3 Encounters:  09/21/20 191 lb (86.6 kg)  08/06/20 197 lb 1.6 oz (89.4 kg)  07/23/20 204 lb 3.2 oz (92.6 kg)   BMI Readings from Last 3 Encounters:  09/21/20 37.30 kg/m  08/06/20 38.49 kg/m  07/23/20 39.88 kg/m     Vaccines:   HPV: s/p hysterectomy  Pneumonia: educated and discussed with patient. Flu: educated and discussed with patient.  Hep C Screening: 07/10/17 STD testing and prevention (HIV/chl/gon/syphilis): 07/24/20 Intimate partner violence:negative Sexual History : same partner, but likes to be checked for STI Menstrual History/LMP/Abnormal Bleeding: s/p hysterectomy   Incontinence Symptoms: urinary frequency since hysterectomy   Breast cancer:  - Last Mammogram: ordered today  - BRCA gene screening: she was adopted - she is aware her biological sister and mother had lupus,  discussed geneticist referral   Osteoporosis: Discussed high calcium and vitamin D supplementation, weight bearing exercises  Cervical cancer screening: 07/15/19  Skin cancer: Discussed monitoring for atypical lesions  Colorectal cancer: N/A   Lung cancer: Low Dose CT Chest recommended if Age 77-80 years, 75 pack-year currently smoking OR have quit w/in 15years. Patient does not qualify.   ECG: 07/23/20  Advanced Care Planning: A voluntary discussion about advance care planning including the explanation and discussion of advance directives.  Discussed health care proxy and Living will, and the patient was able to identify a health care proxy her children   Lipids: Lab Results  Component Value Date   CHOL 169 07/20/2019   CHOL 150 07/20/2018   CHOL 160 07/10/2017   Lab Results  Component Value Date   HDL 43 07/20/2019   HDL 48 07/20/2018   HDL 47 07/10/2017   Lab Results  Component Value Date   LDLCALC 99 07/20/2019   LDLCALC 84 07/20/2018   LDLCALC 103 (H) 07/10/2017   Lab Results  Component  Value Date   TRIG 156 (H) 07/20/2019   TRIG 92 07/20/2018   TRIG 48 07/10/2017   Lab Results  Component Value Date   CHOLHDL 3.9 07/20/2019   CHOLHDL 3.4 07/10/2017   CHOLHDL 3.9 07/08/2016   No results found for: LDLDIRECT  Glucose: Glucose, Bld  Date Value Ref Range Status  07/25/2020 143 (H) 70 - 99 mg/dL Final    Comment:    Glucose reference range applies only to samples taken after fasting for at least 8 hours.  07/24/2020 95 70 - 99 mg/dL Final    Comment:    Glucose reference range applies only to samples taken after fasting for at least 8 hours.  07/23/2020 111 (H) 70 - 99 mg/dL Final    Comment:    Glucose reference range applies only to samples taken  after fasting for at least 8 hours.   Glucose-Capillary  Date Value Ref Range Status  07/24/2020 91 70 - 99 mg/dL Final    Comment:    Glucose reference range applies only to samples taken after fasting for at least 8 hours.    Patient Active Problem List   Diagnosis Date Noted   Ovarian cyst 07/23/2020   S/P hysterectomy 11/16/2019   S/P laparoscopic hysterectomy 11/15/2019   Menorrhagia with regular cycle    Peroneal tendinitis 10/31/2019   Synovitis and tenosynovitis 10/31/2019   Morbid obesity ([place]) 04/16/2018   Encounter for surveillance of injectable contraceptive 02/23/2018   DUB (dysfunctional uterine bleeding) 07/16/2017   B12 deficiency 07/08/2016   History of iron deficiency anemia 07/08/2016   Seasonal allergic rhinitis 05/16/2015   History of eclampsia 12/15/2014   Hypertension, benign 12/15/2014   Large breasts 12/15/2014   Obesity (BMI 35.0-39.9 without comorbidity) 12/15/2014   Thoracic back pain 12/15/2014   Vitamin D deficiency 12/15/2014    Past Surgical History:  Procedure Laterality Date   CYSTOSCOPY N/A 11/15/2019   Procedure: CYSTOSCOPY;  Surgeon: Homero Fellers, MD;  Location: [facility] ORS;  Service: Gynecology;  Laterality: N/A;   DILATATION & CURETTAGE/HYSTEROSCOPY WITH MYOSURE N/A 10/06/2017   Procedure: DILATATION & CURETTAGE /HYSTEROSCOPY;  Surgeon: Homero Fellers, MD;  Location: [facility] ORS;  Service: Gynecology;  Laterality: N/A;   EYE SURGERY Right 2004   eyelid laceration    LAPAROSCOPIC OVARIAN CYSTECTOMY N/A 07/24/2020   Procedure: Diagnostic laparoscopy, lysis of adhesions;  Surgeon: Will Bonnet, MD;  Location: [facility] ORS;  Service: Gynecology;  Laterality: N/A;   TOTAL LAPAROSCOPIC HYSTERECTOMY WITH SALPINGECTOMY Bilateral 11/15/2019   Procedure: TOTAL LAPAROSCOPIC HYSTERECTOMY WITH SALPINGECTOMY;  Surgeon: Homero Fellers, MD;  Location: [facility] ORS;  Service: Gynecology;  Laterality: Bilateral;   TUBAL LIGATION       Family History  Problem Relation Age of Onset   Cancer Mother        eye tumor   Diabetes Mother    Lupus Mother    Alcohol abuse Father    Drug abuse Father    Eczema Daughter    ADD / ADHD Daughter    ADD / ADHD Son    Allergic Disorder Son    Lupus Sister    Depression Sister    Hypertension Brother    Breast cancer Neg Hx     Social History   Socioeconomic History   Marital status: Single    Spouse name: Not on file   Number of children: 3   Years of education: Not on file   Highest education level: Some college, no degree  Occupational History   Not on file  Tobacco Use   Smoking status: Never   Smokeless tobacco: Never  Vaping Use   Vaping Use: Never used  Substance and Sexual Activity   Alcohol use: Yes    Alcohol/week: 0.0 standard drinks    Comment: socially   Drug use: No   Sexual activity: Yes    Birth control/protection: Condom, Injection, Surgical    Comment: Tubal Ligation   Other Topics Concern   Not on file  Social History Narrative   Not on file   Social Determinants of Health   Financial Resource Strain: Low Risk    Difficulty of Paying Living Expenses: Not very hard  Food Insecurity: Food Insecurity Present   Worried About Running Out of Food in the Last Year: Sometimes true   Ran Out of Food in the Last Year: Patient refused  Transportation Needs: No Transportation Needs   Lack of Transportation (Medical): No   Lack of Transportation (Non-Medical): No  Physical Activity: Insufficiently Active   Days of Exercise per Week: 3 days   Minutes of Exercise per Session: 30 min  Stress: Stress Concern Present   Feeling of Stress : Very much  Social Connections: Unknown   Frequency of Communication with Friends and Family: Three times a week   Frequency of Social Gatherings with Friends and Family: Patient refused   Attends Religious Services: Never   Marine scientist or Organizations: No   Attends Music therapist:  Never   Marital Status: Patient refused  Human resources officer Violence: Not At Risk   Fear of Current or Ex-Partner: No   Emotionally Abused: No   Physically Abused: No   Sexually Abused: No     Current Outpatient Medications:    ascorbic acid (VITAMIN C) 500 MG tablet, Take 500 mg by mouth daily., Disp: , Rfl:    Cyanocobalamin (B-12) 1000 MCG SUBL, Place 1 tablet under the tongue daily. (Patient taking differently: Place 1,000 mcg under the tongue daily.), Disp: 100 tablet, Rfl: 1   docusate sodium (COLACE) 100 MG capsule, TAKE 1 CAPSULE (100 MG TOTAL) BY MOUTH 2 (TWO) TIMES DAILY AS NEEDED FOR MILD CONSTIPATION., Disp: 30 capsule, Rfl: 0   ibuprofen (ADVIL,MOTRIN) 600 MG tablet, Take 1 tablet (600 mg total) by mouth every 6 (six) hours as needed for cramping. (Patient taking differently: Take 1,200 mg by mouth every 6 (six) hours as needed (chronic back pain).), Disp: 60 tablet, Rfl: 0   loratadine (CLARITIN) 10 MG tablet, Take 1 tablet (10 mg total) by mouth daily as needed for allergies., Disp: 30 tablet, Rfl: 11   montelukast (SINGULAIR) 10 MG tablet, Take 1 tablet (10 mg total) by mouth at bedtime. (Patient taking differently: Take 10 mg by mouth at bedtime as needed (allergies.).), Disp: 90 tablet, Rfl: 1   Multiple Vitamin (MULTIVITAMIN WITH MINERALS) TABS tablet, Take 1 tablet by mouth daily., Disp: , Rfl:    Omega-3 Fatty Acids (FISH OIL) 1000 MG CAPS, Take 1,000 mg by mouth every Sunday. , Disp: , Rfl:    simethicone (MYLICON) 80 MG chewable tablet, Chew 1 tablet (80 mg total) by mouth 4 (four) times daily., Disp: 30 tablet, Rfl: 0   valsartan-hydrochlorothiazide (DIOVAN-HCT) 160-25 MG tablet, Take 1 tablet by mouth daily., Disp: 90 tablet, Rfl: 0   Vitamin D, Ergocalciferol, (DRISDOL) 1.25 MG (50000 UNIT) CAPS capsule, TAKE 1 CAPSULE (50,000 UNITS TOTAL) BY MOUTH EVERY SUNDAY., Disp: 12 capsule, Rfl: 1   metoCLOPramide (  REGLAN) 10 MG tablet, Take 1 tablet (10 mg total) by mouth 3  (three) times daily before meals for 14 days., Disp: 42 tablet, Rfl: 0   pantoprazole (PROTONIX) 40 MG tablet, Take 1 tablet (40 mg total) by mouth daily., Disp: 90 tablet, Rfl: 0  Allergies  Allergen Reactions   Cherry Hives and Swelling    Fresh cherries with a stem   Morphine And Related     Pt doesn't like the way it makes her feel when coming off it. If has to have, will use.     ROS  Constitutional: Negative for fever or weight change.  Respiratory: Negative for cough and shortness of breath.   Cardiovascular: Negative for chest pain or palpitations.  Gastrointestinal: positive  for abdominal pain,but no  bowel changes.  Musculoskeletal: Negative for gait problem or joint swelling.  Skin: Negative for rash.  Neurological: Negative for dizziness or headache.  No other specific complaints in a complete review of systems (except as listed in HPI above).   Objective  Vitals:   09/21/20 1344  BP: 132/70  Pulse: 84  Resp: 16  Temp: 98.2 F (36.8 C)  SpO2: 97%  Weight: 191 lb (86.6 kg)  Height: 5' (1.524 m)    Body mass index is 37.3 kg/m.  Physical Exam  Constitutional: Patient appears well-developed and well-nourished. No distress.  HENT: Head: Normocephalic and atraumatic. Ears: B TMs ok, no erythema or effusion; Nose: Nose normal. Mouth/Throat:not done  Eyes: Conjunctivae and EOM are normal. Pupils are equal, round, and reactive to light. No scleral icterus.  Neck: Normal range of motion. Neck supple. No JVD present. No thyromegaly present.  Cardiovascular: Normal rate, regular rhythm and normal heart sounds.  No murmur heard. No BLE edema. Pulmonary/Chest: Effort normal and breath sounds normal. No respiratory distress. Abdominal: Soft. Bowel sounds are normal, no distension. There is no tenderness. no masses Breast:lumpy breasts bilaterally , tender to touch - she usually has diagnostic studies during exam ( discussed fibrocystic breast disease) , no nipple  discharge or rashes FEMALE GENITALIA:  Not done  RECTAL: not done  Musculoskeletal: Normal range of motion, no joint effusions. No gross deformities Neurological: he is alert and oriented to person, place, and time. No cranial nerve deficit. Coordination, balance, strength, speech and gait are normal.  Skin: Skin is warm and dry. No rash noted. No erythema.  Psychiatric: Patient has a normal mood and affect. behavior is normal. Judgment and thought content normal.     Fall Risk: Fall Risk  09/21/2020 08/06/2020 07/15/2019 07/13/2018 05/21/2018  Falls in the past year? 0 1 1 1  0  Number falls in past yr: 0 0 0 1 0  Injury with Fall? 0 1 1 1  0  Risk for fall due to : No Fall Risks History of fall(s) - - -  Follow up Falls prevention discussed Falls evaluation completed - - -     Functional Status Survey: Is the patient deaf or have difficulty hearing?: No Does the patient have difficulty seeing, even when wearing glasses/contacts?: No Does the patient have difficulty concentrating, remembering, or making decisions?: Yes Does the patient have difficulty walking or climbing stairs?: Yes Does the patient have difficulty dressing or bathing?: No Does the patient have difficulty doing errands alone such as visiting a doctor's office or shopping?: No   Assessment & Plan  1. Well adult exam  - Lipid panel - CBC with Differential/Platelet - COMPLETE METABOLIC PANEL WITH GFR - Vitamin B12 -  Iron, TIBC and Ferritin Panel - TSH - Hemoglobin A1c  2. Vitamin D deficiency  - VITAMIN D 25 Hydroxy (Vit-D Deficiency, Fractures)  3. B12 deficiency  - Vitamin B12  4. Breast cancer screening by mammogram  - MM 3D SCREEN BREAST BILATERAL; Future  5. Morbid obesity with BMI of 40.0-44.9, adult (HCC)  BMI above 35 with co-morbidities   6. Hypertension, benign   7. Anemia, unspecified type  - CBC with Differential/Platelet - Vitamin B12 - Iron, TIBC and Ferritin Panel  8. Diabetes  mellitus screening  - Hemoglobin A1c  9. Lipid screening  - Lipid panel  10. Low TSH level  - TSH  11. Ovarian cyst, complex  - Ambulatory referral to Obstetrics / Gynecology  12. Routine screening for STI (sexually transmitted infection)  - RPR - HIV Antibody (routine testing w rflx) - Cytology (oral, anal, urethral) ancillary only  13. Adopted person  - Ambulatory referral to Genetics   Discussed sooner follow up to discuss results but she prefers waiting 3 months to come back   -USPSTF grade A and B recommendations reviewed with patient; age-appropriate recommendations, preventive care, screening tests, etc discussed and encouraged; healthy living encouraged; see AVS for patient education given to patient -Discussed importance of 150 minutes of physical activity weekly, eat two servings of fish weekly, eat one serving of tree nuts ( cashews, pistachios, pecans, almonds.Marland Kitchen) every other day, eat 6 servings of fruit/vegetables daily and drink plenty of water and avoid sweet beverages.

## 2020-09-21 ENCOUNTER — Encounter: Payer: Self-pay | Admitting: Family Medicine

## 2020-09-21 ENCOUNTER — Other Ambulatory Visit (HOSPITAL_COMMUNITY)
Admission: RE | Admit: 2020-09-21 | Discharge: 2020-09-21 | Disposition: A | Discharge status: Home / Self Care | Payer: Managed Care, Other (non HMO) | Source: Ambulatory Visit | Attending: Family Medicine | Admitting: Family Medicine

## 2020-09-21 ENCOUNTER — Ambulatory Visit (INDEPENDENT_AMBULATORY_CARE_PROVIDER_SITE_OTHER): Payer: Managed Care, Other (non HMO) | Admitting: Family Medicine

## 2020-09-21 ENCOUNTER — Other Ambulatory Visit: Payer: Self-pay

## 2020-09-21 VITALS — BP 132/70 | HR 84 | Temp 98.2°F | Resp 16 | Ht 60.0 in | Wt 191.0 lb

## 2020-09-21 DIAGNOSIS — Z131 Encounter for screening for diabetes mellitus: Secondary | ICD-10-CM

## 2020-09-21 DIAGNOSIS — Z13220 Encounter for screening for lipoid disorders: Secondary | ICD-10-CM

## 2020-09-21 DIAGNOSIS — Z113 Encounter for screening for infections with a predominantly sexual mode of transmission: Secondary | ICD-10-CM

## 2020-09-21 DIAGNOSIS — Z0282 Encounter for adoption services: Secondary | ICD-10-CM

## 2020-09-21 DIAGNOSIS — Z1231 Encounter for screening mammogram for malignant neoplasm of breast: Secondary | ICD-10-CM | POA: Diagnosis not present

## 2020-09-21 DIAGNOSIS — Z0000 Encounter for general adult medical examination without abnormal findings: Secondary | ICD-10-CM | POA: Diagnosis not present

## 2020-09-21 DIAGNOSIS — R7989 Other specified abnormal findings of blood chemistry: Secondary | ICD-10-CM

## 2020-09-21 DIAGNOSIS — E538 Deficiency of other specified B group vitamins: Secondary | ICD-10-CM | POA: Diagnosis not present

## 2020-09-21 DIAGNOSIS — E559 Vitamin D deficiency, unspecified: Secondary | ICD-10-CM

## 2020-09-21 DIAGNOSIS — I10 Essential (primary) hypertension: Secondary | ICD-10-CM

## 2020-09-21 DIAGNOSIS — D649 Anemia, unspecified: Secondary | ICD-10-CM

## 2020-09-21 DIAGNOSIS — N83299 Other ovarian cyst, unspecified side: Secondary | ICD-10-CM

## 2020-09-21 DIAGNOSIS — Z6841 Body Mass Index (BMI) 40.0 and over, adult: Secondary | ICD-10-CM

## 2020-09-24 ENCOUNTER — Telehealth: Payer: Self-pay

## 2020-09-24 LAB — CBC WITH DIFFERENTIAL/PLATELET
Absolute Monocytes: 482 cells/uL (ref 200–950)
Basophils Absolute: 40 cells/uL (ref 0–200)
Basophils Relative: 0.6 %
Eosinophils Absolute: 127 cells/uL (ref 15–500)
Eosinophils Relative: 1.9 %
HCT: 35.6 % (ref 35.0–45.0)
Hemoglobin: 11.6 g/dL — ABNORMAL LOW (ref 11.7–15.5)
Lymphs Abs: 2667 cells/uL (ref 850–3900)
MCH: 28.7 pg (ref 27.0–33.0)
MCHC: 32.6 g/dL (ref 32.0–36.0)
MCV: 88.1 fL (ref 80.0–100.0)
MPV: 10.2 fL (ref 7.5–12.5)
Monocytes Relative: 7.2 %
Neutro Abs: 3384 cells/uL (ref 1500–7800)
Neutrophils Relative %: 50.5 %
Platelets: 363 10*3/uL (ref 140–400)
RBC: 4.04 10*6/uL (ref 3.80–5.10)
RDW: 12.6 % (ref 11.0–15.0)
Total Lymphocyte: 39.8 %
WBC: 6.7 10*3/uL (ref 3.8–10.8)

## 2020-09-24 LAB — LIPID PANEL
Cholesterol: 161 mg/dL (ref <200)
HDL: 53 mg/dL (ref >=50)
LDL Cholesterol (Calc): 93 mg/dL (calc)
Non-HDL Cholesterol (Calc): 108 mg/dL (calc) (ref <130)
Total CHOL/HDL Ratio: 3 (calc) (ref <5.0)
Triglycerides: 65 mg/dL (ref <150)

## 2020-09-24 LAB — COMPLETE METABOLIC PANEL WITH GFR
AG Ratio: 1.6 (calc) (ref 1.0–2.5)
ALT: 17 U/L (ref 6–29)
AST: 15 U/L (ref 10–30)
Albumin: 4.2 g/dL (ref 3.6–5.1)
Alkaline phosphatase (APISO): 64 U/L (ref 31–125)
BUN: 14 mg/dL (ref 7–25)
CO2: 29 mmol/L (ref 20–32)
Calcium: 9.2 mg/dL (ref 8.6–10.2)
Chloride: 104 mmol/L (ref 98–110)
Creat: 0.7 mg/dL (ref 0.50–0.99)
Globulin: 2.7 g/dL (calc) (ref 1.9–3.7)
Glucose, Bld: 72 mg/dL (ref 65–99)
Potassium: 3.6 mmol/L (ref 3.5–5.3)
Sodium: 138 mmol/L (ref 135–146)
Total Bilirubin: 0.5 mg/dL (ref 0.2–1.2)
Total Protein: 6.9 g/dL (ref 6.1–8.1)
eGFR: 111 mL/min/{1.73_m2} (ref >=60)

## 2020-09-24 LAB — RPR: RPR Ser Ql: NONREACTIVE

## 2020-09-24 LAB — IRON,TIBC AND FERRITIN PANEL
%SAT: 26 % (calc) (ref 16–45)
Ferritin: 92 ng/mL (ref 16–232)
Iron: 88 ug/dL (ref 40–190)
TIBC: 343 mcg/dL (calc) (ref 250–450)

## 2020-09-24 LAB — TSH: TSH: 1.04 mIU/L

## 2020-09-24 LAB — VITAMIN D 25 HYDROXY (VIT D DEFICIENCY, FRACTURES): Vit D, 25-Hydroxy: 42 ng/mL (ref 30–100)

## 2020-09-24 LAB — HIV ANTIBODY (ROUTINE TESTING W REFLEX): HIV 1&2 Ab, 4th Generation: NONREACTIVE

## 2020-09-24 LAB — HEMOGLOBIN A1C
Hgb A1c MFr Bld: 5.4 % of total Hgb (ref <5.7)
Mean Plasma Glucose: 108 mg/dL
eAG (mmol/L): 6 mmol/L

## 2020-09-24 LAB — VITAMIN B12: Vitamin B-12: 473 pg/mL (ref 200–1100)

## 2020-09-24 NOTE — Telephone Encounter (Signed)
[facility] referring for Complex right ovarian cyst. Called and left voicemail for patient to call back to be scheduled.

## 2020-09-25 LAB — CYTOLOGY, (ORAL, ANAL, URETHRAL) ANCILLARY ONLY
Chlamydia: NEGATIVE
Comment: NEGATIVE
Comment: NORMAL
Neisseria Gonorrhea: NEGATIVE

## 2020-09-25 NOTE — Telephone Encounter (Signed)
Called and left voicemail for patient to call back to be scheduled. 

## 2020-09-26 NOTE — Telephone Encounter (Signed)
Called and left voicemail for patient to call back to be scheduled. 

## 2020-10-09 ENCOUNTER — Ambulatory Visit (INDEPENDENT_AMBULATORY_CARE_PROVIDER_SITE_OTHER): Payer: Managed Care, Other (non HMO) | Admitting: Obstetrics and Gynecology

## 2020-10-09 ENCOUNTER — Other Ambulatory Visit: Payer: Self-pay

## 2020-10-09 ENCOUNTER — Encounter: Payer: Self-pay | Admitting: Licensed Clinical Social Worker

## 2020-10-09 ENCOUNTER — Inpatient Hospital Stay: Payer: Managed Care, Other (non HMO) | Attending: Oncology | Admitting: Licensed Clinical Social Worker

## 2020-10-09 ENCOUNTER — Inpatient Hospital Stay: Payer: Managed Care, Other (non HMO)

## 2020-10-09 ENCOUNTER — Encounter: Payer: Self-pay | Admitting: Obstetrics and Gynecology

## 2020-10-09 VITALS — BP 126/84 | Ht 60.0 in | Wt 194.0 lb

## 2020-10-09 DIAGNOSIS — N83201 Unspecified ovarian cyst, right side: Secondary | ICD-10-CM | POA: Diagnosis not present

## 2020-10-09 DIAGNOSIS — Z789 Other specified health status: Secondary | ICD-10-CM

## 2020-10-09 DIAGNOSIS — N6001 Solitary cyst of right breast: Secondary | ICD-10-CM

## 2020-10-09 DIAGNOSIS — N6002 Solitary cyst of left breast: Secondary | ICD-10-CM

## 2020-10-09 DIAGNOSIS — N83202 Unspecified ovarian cyst, left side: Secondary | ICD-10-CM

## 2020-10-09 DIAGNOSIS — Z90710 Acquired absence of both cervix and uterus: Secondary | ICD-10-CM

## 2020-10-09 DIAGNOSIS — Z809 Family history of malignant neoplasm, unspecified: Secondary | ICD-10-CM | POA: Diagnosis not present
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
## 2020-10-17 ENCOUNTER — Ambulatory Visit
Admission: RE | Admit: 2020-10-17 | Discharge: 2020-10-17 | Disposition: A | Discharge status: Home / Self Care | Payer: Managed Care, Other (non HMO) | Source: Ambulatory Visit | Attending: Obstetrics and Gynecology | Admitting: Obstetrics and Gynecology

## 2020-10-17 ENCOUNTER — Other Ambulatory Visit: Payer: Self-pay

## 2020-10-17 DIAGNOSIS — N83201 Unspecified ovarian cyst, right side: Secondary | ICD-10-CM

## 2020-10-17 IMAGING — US US TRANSVAGINAL NON-OB
1 series · 13 of 25 positions shown · non-contrast
Comparison: Prior ultrasound from [DATE].

CLINICAL DATA: Follow-up examination of right ovarian cyst. History
of prior hysterectomy.

EXAM:
TRANSABDOMINAL AND TRANSVAGINAL ULTRASOUND OF PELVIS
TECHNIQUE: Both transabdominal and transvaginal ultrasound examinations of the
pelvis were performed. Transabdominal technique was performed for
global imaging of the pelvis including uterus, ovaries, adnexal
regions, and pelvic cul-de-sac. It was necessary to proceed with
endovaginal exam following the transabdominal exam to visualize the
pelvic structures.

[Series 1: us transvaginal non-ob · 0.09mm/px · 29 acquisitions, 13 frames shown]
[im 1/29]
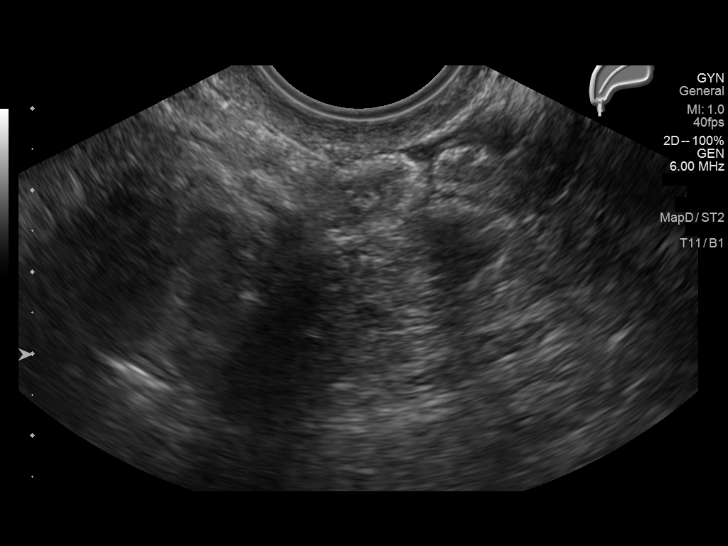
[im 3/29]
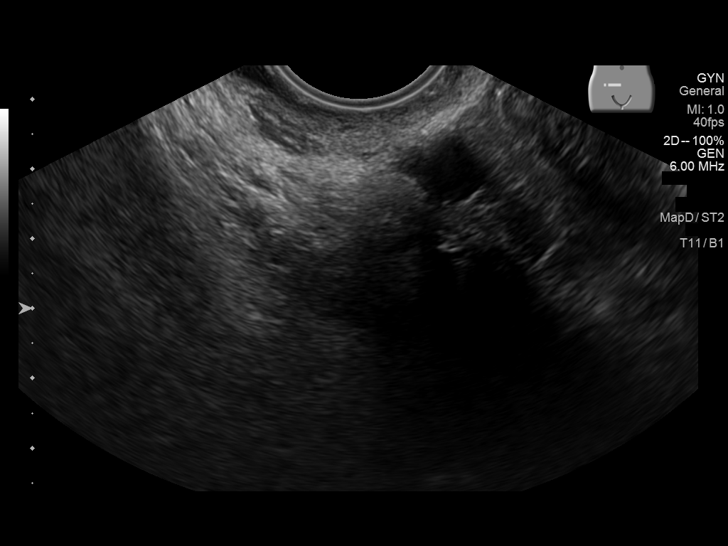
[im 5/29]
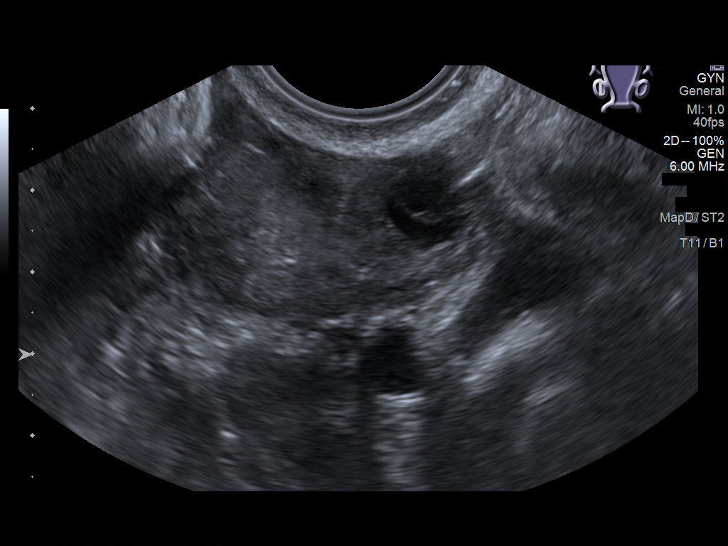
[im 8/29]
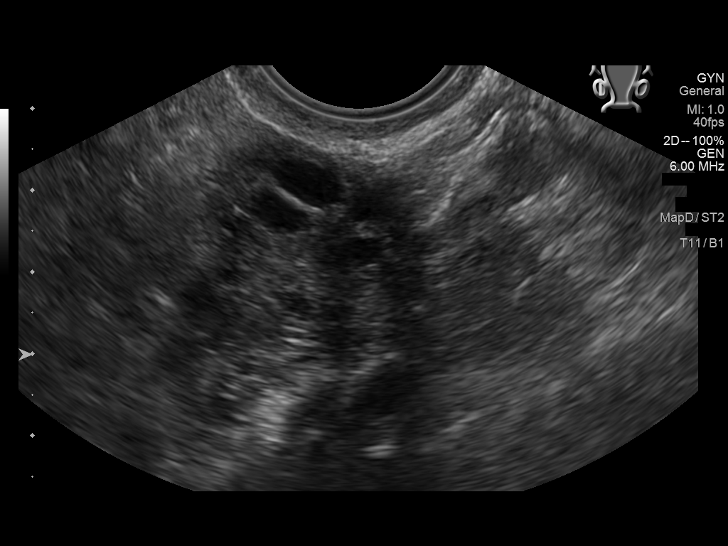
[im 10/29]
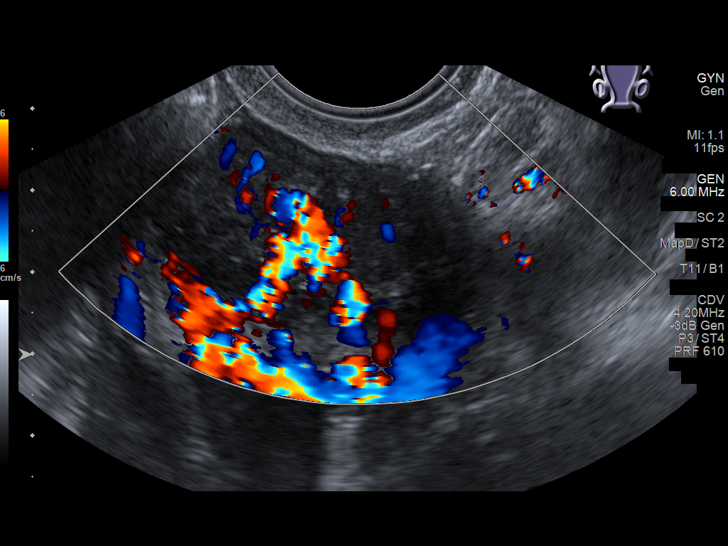
[im 12/29]
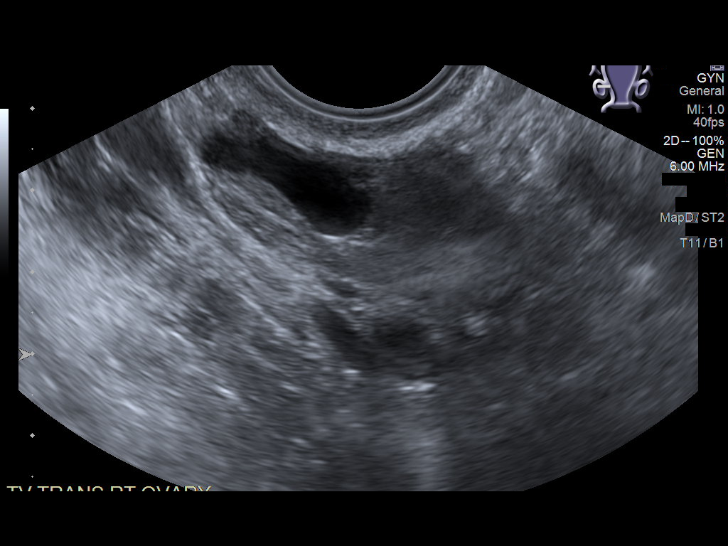
[im 15/29]
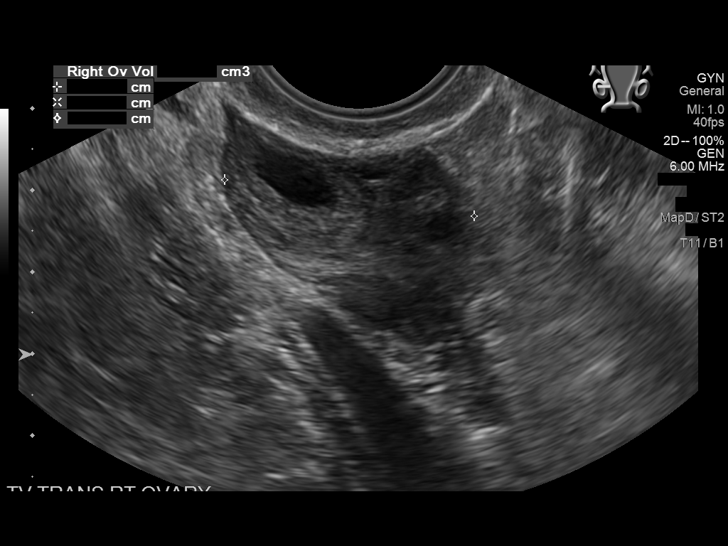
[im 17/29]
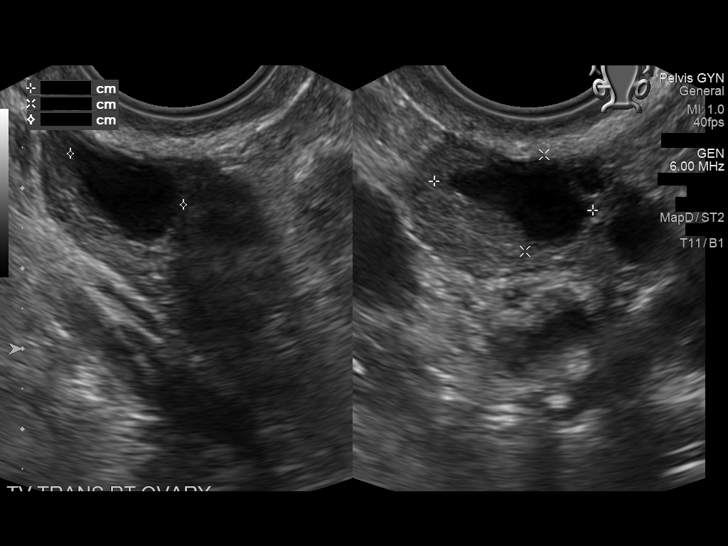
[im 19/29]
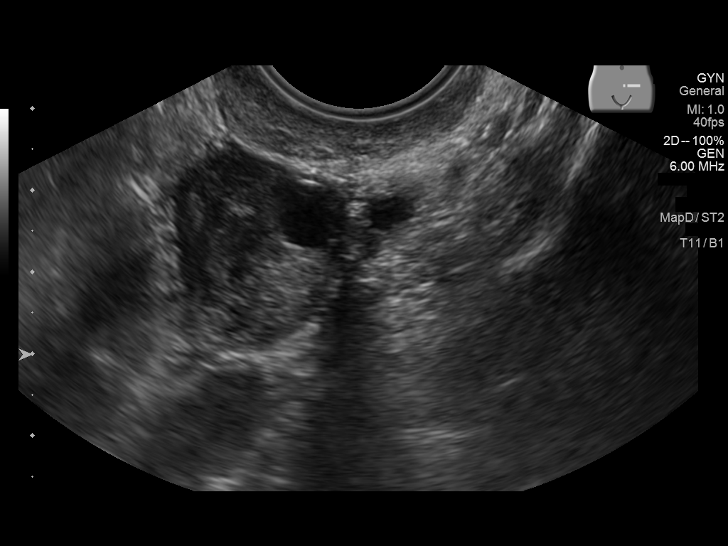
[im 22/29]
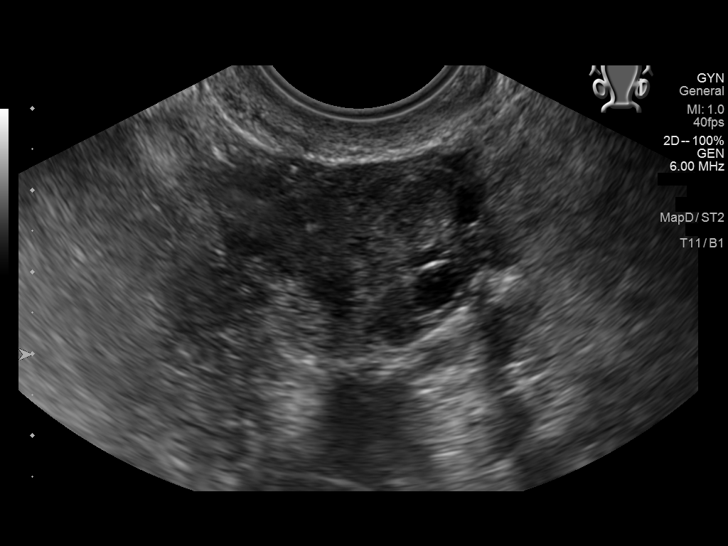
[im 24/29]
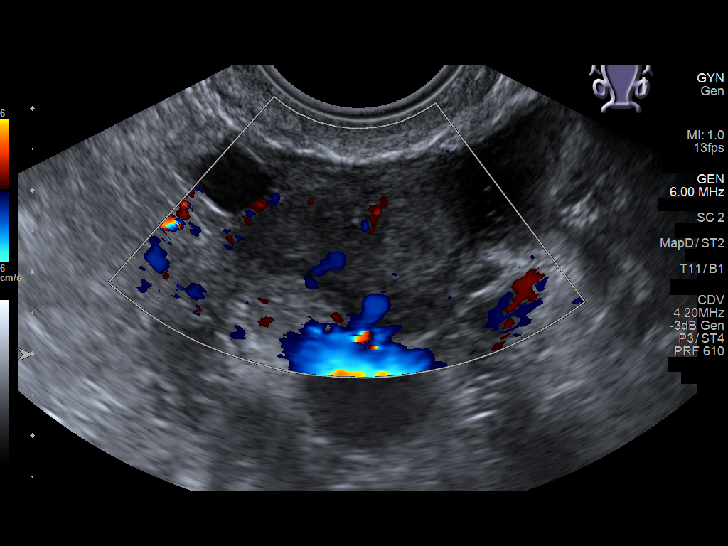
[im 26/29]
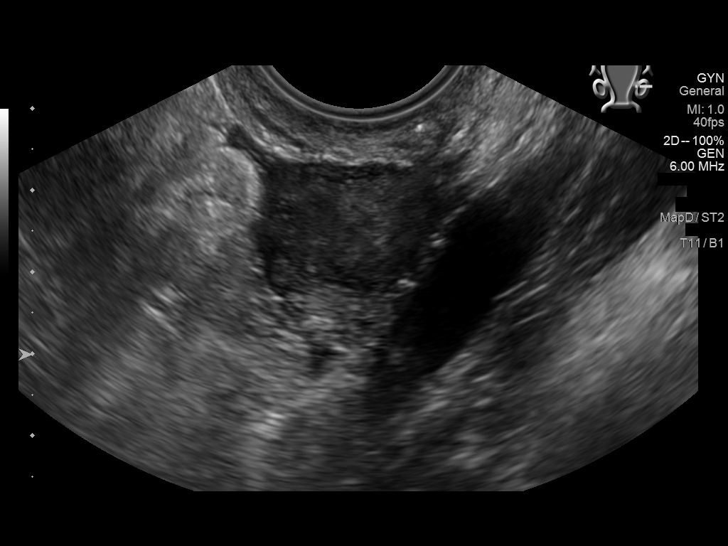
[im 29/29]
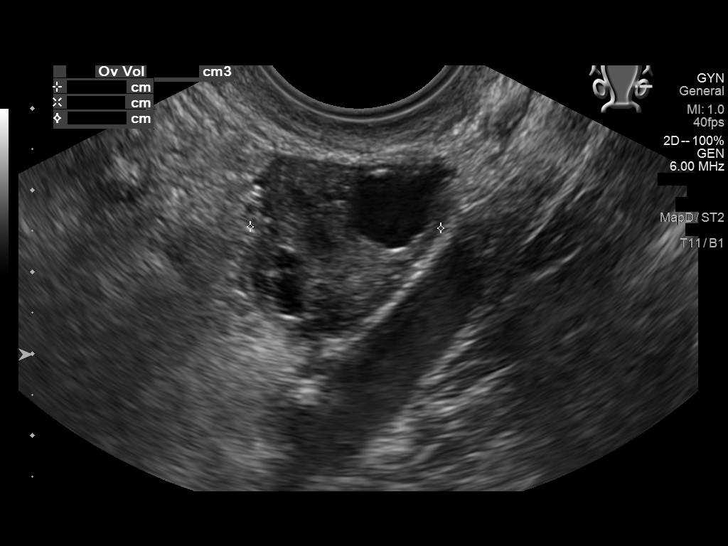

[13 of 25 positions shown; findings below may reference images not displayed]

FINDINGS: Uterus

Prior hysterectomy.  No abnormality about the vaginal cuff.

Endometrium

Surgically absent.

Right ovary

Measurements: 4.2 x 2.1 x 3.1 cm = volume: 14.0 mL. Previously seen
complex right ovarian cyst is no longer clearly visualized and has
resolved. A 2 cm collapsing dominant follicle noted. Few other
additional scattered subcentimeter follicles noted as well. No other
adnexal mass.

Left ovary

Measurements: 3.2 x 1.7 x 2.3 cm = volume: 6.6 mL. Normal
appearance/no adnexal mass.

Other findings

Small volume free fluid within the pelvis, likely physiologic.
IMPRESSION: 1. Interval resolution of previously seen complex right ovarian
cyst.
2. Small volume free fluid within the pelvis, likely physiologic.
3. Prior hysterectomy.

## 2020-10-24 ENCOUNTER — Encounter: Payer: Self-pay | Admitting: Licensed Clinical Social Worker

## 2020-10-24 ENCOUNTER — Telehealth: Payer: Self-pay | Admitting: Licensed Clinical Social Worker

## 2020-10-24 ENCOUNTER — Other Ambulatory Visit: Payer: Self-pay

## 2020-10-24 ENCOUNTER — Ambulatory Visit (INDEPENDENT_AMBULATORY_CARE_PROVIDER_SITE_OTHER): Payer: Managed Care, Other (non HMO) | Admitting: Obstetrics and Gynecology

## 2020-10-24 ENCOUNTER — Encounter: Payer: Self-pay | Admitting: Obstetrics and Gynecology

## 2020-10-24 DIAGNOSIS — R102 Pelvic and perineal pain: Secondary | ICD-10-CM

## 2020-10-24 DIAGNOSIS — N83201 Unspecified ovarian cyst, right side: Secondary | ICD-10-CM | POA: Diagnosis not present

## 2020-10-24 NOTE — Progress Notes (Signed)
Virtual Visit via Telephone Note  I connected with Anita Newton on 10/24/20 at 10:10 AM EDT by telephone and verified that I am speaking with the correct person using two identifiers.   I discussed the limitations, risks, security and privacy concerns of performing an evaluation and management service by telephone and the availability of in person appointments. I also discussed with the patient that there may be a patient responsible charge related to this service. The patient expressed understanding and agreed to proceed.  The patient was at home I spoke with the patient from my office The names of people involved in this encounter were: Anita Newton and Prentice Docker, MD.   History of Present Illness: 40 y.o. female who presents via phone for right ovarian cyst.  She still has some discomfort and bloating in her right lower quadrant. The ultrasound showed resolution of the right ovarian cyst.  We discussed her ultrasound findings that showed no concerning findings with her ovary.  There are no other findings to suggest another etiology for her discomfort and her sepsis that she had in June.   Ultrasound report 10/17/2020: FINDINGS: Uterus   Prior hysterectomy.  No abnormality about the vaginal cuff.   Endometrium   Surgically absent.   Right ovary   Measurements: 4.2 x 2.1 x 3.1 cm = volume: 14.0 mL. Previously seen complex right ovarian cyst is no longer clearly visualized and has resolved. A 2 cm collapsing dominant follicle noted. Few other additional scattered subcentimeter follicles noted as well. No other adnexal mass.   Left ovary   Measurements: 3.2 x 1.7 x 2.3 cm = volume: 6.6 mL. Normal appearance/no adnexal mass.   Other findings   Small volume free fluid within the pelvis, likely physiologic.   IMPRESSION: 1. Interval resolution of previously seen complex right ovarian cyst. 2. Small volume free fluid within the pelvis, likely physiologic. 3. Prior  hysterectomy.  Observations/Objective:  Physical Exam could not be performed. Because of the COVID-19 outbreak this visit was performed over the phone and not in person.   Assessment and Plan:   ICD-10-CM   1. Right ovarian cyst  N83.201     2. Pelvic pain  R10.2        Follow Up Instructions: We discussed that there are no indicators that her pain, discomfort, infection history are related to her prior right ovarian cyst, which has now resolved based on the ultrasound findings.  Therefore, I can offer her no predictable way of helping her with her pain.  We discussed removal of the ovary.  However, given no pathologic findings on surgical evaluation and on follow-up ultrasound, it is unlikely that removal of the ovary would be of any benefit to the patient.  She voiced understanding to this and is in agreement with the plan to perform no further surgery and have no further gynecologic intervention.  If new symptoms develop, she is always welcome to contact my office.   I discussed the assessment and treatment plan with the patient. The patient was provided an opportunity to ask questions and all were answered. The patient agreed with the plan and demonstrated an understanding of the instructions.   The patient was advised to call back or seek an in-person evaluation if the symptoms worsen or if the condition fails to improve as anticipated.  I provided 17 minutes of non-face-to-face time during this encounter.  Prentice Docker, MD  [facility], [facility] 10/24/2020 12:23 PM

## 2020-10-26 ENCOUNTER — Ambulatory Visit: Payer: Self-pay | Admitting: Licensed Clinical Social Worker

## 2020-10-26 DIAGNOSIS — Z789 Other specified health status: Secondary | ICD-10-CM

## 2020-10-26 DIAGNOSIS — Z1379 Encounter for other screening for genetic and chromosomal anomalies: Secondary | ICD-10-CM

## 2020-10-26 NOTE — Progress Notes (Signed)
HPI:  Anita Newton was previously seen in the [place] clinic due to a unknown family history and concerns regarding a hereditary predisposition to cancer. Please refer to our prior cancer genetics clinic note for more information regarding our discussion, assessment and recommendations, at the time. Anita Newton recent genetic test results were disclosed to her, as were recommendations warranted by these results. These results and recommendations are discussed in more detail below.  CANCER HISTORY:  Oncology History   No history exists.    FAMILY HISTORY:  We obtained a detailed, 4-generation family history.  Significant diagnoses are listed below: Family History  Adopted: Yes  Problem Relation Age of Onset   Cancer Mother        eye tumor   Diabetes Mother    Lupus Mother    Alcohol abuse Father    Drug abuse Father    Lupus Sister    Depression Sister    Hypertension Brother    Eczema Daughter    ADD / ADHD Daughter    ADD / ADHD Son    Allergic Disorder Son    Breast cancer Neg Hx    Anita Newton has very limited information about her family. She was raised by her maternal great aunt, who had a different father than her grandmother. Patient knows her biological mother had an eye tumor but is unsure if it was cancerous. A paternal aunt died of cancer, unknown type. No other known cancers in her family, however the patient is very concerned about her personal risks for breast and ovarian cancer as she has had issues with both in the past.    Anita Newton is unaware of previous family history of genetic testing for hereditary cancer risks. Patient's maternal ancestors are of Black descent, and paternal ancestors are of Black descent. There is no reported Ashkenazi Jewish ancestry. There is no known consanguinity.      GENETIC TEST RESULTS: Genetic testing reported out on 10/24/2020 through the [facility] +RNA cancer panel found no pathogenic  mutations.   The CancerNext-Expanded + RNAinsight gene panel offered by [employer] and includes sequencing and rearrangement analysis for the following 77 genes: IP, ALK, APC*, ATM*, AXIN2, BAP1, BARD1, BLM, BMPR1A, BRCA1*, BRCA2*, BRIP1*, CDC73, CDH1*,CDK4, CDKN1B, CDKN2A, CHEK2*, CTNNA1, DICER1, FANCC, FH, FLCN, GALNT12, KIF1B, LZTR1, MAX, MEN1, MET, MLH1*, MSH2*, MSH3, MSH6*, MUTYH*, NBN, NF1*, NF2, NTHL1, PALB2*, PHOX2B, PMS2*, POT1, PRKAR1A, PTCH1, PTEN*, RAD51C*, RAD51D*,RB1, RECQL, RET, SDHA, SDHAF2, SDHB, SDHC, SDHD, SMAD4, SMARCA4, SMARCB1, SMARCE1, STK11, SUFU, TMEM127, TP53*,TSC1, TSC2, VHL and XRCC2 (sequencing and deletion/duplication); EGFR, EGLN1, HOXB13, KIT, MITF, PDGFRA, POLD1 and POLE (sequencing only); EPCAM and GREM1 (deletion/duplication only).   The test report has been scanned into EPIC and is located under the Molecular Pathology section of the Results Review tab.  A portion of the result report is included below for reference.     We discussed that because current genetic testing is not perfect, it is possible there may be a gene mutation in one of these genes that current testing cannot detect, but that chance is small.  There could be another gene that has not yet been discovered, or that we have not yet tested, that is responsible for the cancer diagnoses in the family. It is also possible there is a hereditary cause for the cancer in the family that Anita Newton did not inherit and therefore was not identified in her testing.  Therefore, it is important to remain in touch with  cancer genetics in the future so that we can continue to offer Anita Newton the most up to date genetic testing.   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  At this time, it is unknown if this variant is associated with increased cancer risk or if this is a normal finding, but most variants such as this get reclassified to being inconsequential. It  should not be used to make medical management decisions. With time, we suspect the lab will determine the significance of this variant, if any. If we do learn more about it we will try to contact Anita Newton to discuss it further. However, it is important to stay in touch with [place] periodically and keep the address and phone number up to date.  ADDITIONAL GENETIC TESTING: We discussed with Anita Newton that her genetic testing was fairly extensive.  If there are genes identified to increase cancer risk that can be analyzed in the future, we would be happy to discuss and coordinate this testing at that time.    CANCER SCREENING RECOMMENDATIONS: Anita Newton test result is considered negative (normal).  This means that we have not identified a hereditary cause for her family history of cancer at this time.   While reassuring, this does not definitively rule out a hereditary predisposition to cancer. It is still possible that there could be genetic mutations that are undetectable by current technology. There could be genetic mutations in genes that have not been tested or identified to increase cancer risk.  Therefore, it is recommended she continue to follow the cancer management and screening guidelines provided by her  primary healthcare provider.   An individual's cancer risk and medical management are not determined by genetic test results alone. Overall cancer risk assessment incorporates additional factors, including personal medical history, family history, and any available genetic information that may result in a personalized plan for cancer prevention and surveillance.  RECOMMENDATIONS FOR FAMILY MEMBERS:  Relatives in this family might be at some increased risk of developing cancer, over the general population risk, simply due to the family history of cancer.  We recommended female relatives in this family have a yearly mammogram beginning at age 47, or 34 years younger than the earliest onset of  cancer, an annual clinical breast exam, and perform monthly breast self-exams. Female relatives in this family should also have a gynecological exam as recommended by their primary provider.  All family members should be referred for colonoscopy starting at age 54.   FOLLOW-UP: Lastly, we discussed with Anita Newton that cancer genetics is a rapidly advancing field and it is possible that new genetic tests will be appropriate for her and/or her family members in the future. We encouraged her to remain in contact with cancer genetics on an annual basis so we can update her personal and family histories and let her know of advances in cancer genetics that may benefit this family.   Our contact number was provided. Anita Newton questions were answered to her satisfaction, and she knows she is welcome to call us at anytime with additional questions or concerns.   Faith Rogue, MS, [facility] Genetic Counselor [email or web address] Phone: [phone]

## 2020-10-26 NOTE — Telephone Encounter (Signed)
Revealed negative genetic testing.  Revealed that a VUS in RB1 was identified. This normal result is reassuring. It is unlikely that there is an increased risk of cancer due to a mutation in one of these genes.  However, genetic testing is not perfect, and cannot definitively rule out a hereditary cause.  It will be important for her to keep in contact with genetics to learn if any additional testing may be needed in the future.

## 2020-12-25 ENCOUNTER — Ambulatory Visit: Payer: Managed Care, Other (non HMO) | Admitting: Family Medicine

## 2021-01-22 NOTE — Progress Notes (Signed)
Name: Anita Newton   MRN: [id number]    DOB: 1980-03-25   Date:01/23/2021       Progress Note  Subjective  Chief Complaint  Follow up  HPI  B12 deficiency: taking SL supplementation and last level was normal   Vitamin D deficiency: taking supplements and last level was back to normal   S/p hysterectomy 11/2019 and since that time continues to RLQ pain and discomfort when she voids, no blood in urine, she developed N/V and had a complex ovarian cyst on the right side had laparoscopic surgery and lysis of adhesions. She is not having N/V but the pain and discomfort is still present. She is able to work and exercise but has intermittent pain and worse when voiding up to 7/10 . She went back to see Dr. Glennon Mac 09/22 and repeat Pelvic US showed resolution of complex ovarian cyst. Discussed referral to Urologist since she seems to have pain when voiding. She is frustrated about all the medical bills and no answers. She will let me know when she is ready to go   HTN: taking medications, denies chest pain or palpitation. No side effects . BP is at goal   Large breast/Thoracic back pain: we have referred her to PT and has been physically active but continues to take ibuprofen daily to control her upper back pain. She is going to the gym M-T and doing the PT that she learned to help with upper back pain    Patient Active Problem List   Diagnosis Date Noted   Genetic testing 10/26/2020   Family history unknown 10/09/2020   Ovarian cyst 07/23/2020   S/P laparoscopic hysterectomy 11/15/2019   Menorrhagia with regular cycle    Peroneal tendinitis 10/31/2019   Synovitis and tenosynovitis 10/31/2019   Morbid obesity ([place]) 04/16/2018   B12 deficiency 07/08/2016   History of iron deficiency anemia 07/08/2016   Seasonal allergic rhinitis 05/16/2015   History of eclampsia 12/15/2014   Hypertension, benign 12/15/2014   Large breasts 12/15/2014   Obesity (BMI 35.0-39.9 without comorbidity)  12/15/2014   Thoracic back pain 12/15/2014   Vitamin D deficiency 12/15/2014    Past Surgical History:  Procedure Laterality Date   CYSTOSCOPY N/A 11/15/2019   Procedure: CYSTOSCOPY;  Surgeon: Homero Fellers, MD;  Location: [facility];  Service: Gynecology;  Laterality: N/A;   DILATATION & CURETTAGE/HYSTEROSCOPY WITH MYOSURE N/A 10/06/2017   Procedure: DILATATION & CURETTAGE /HYSTEROSCOPY;  Surgeon: Homero Fellers, MD;  Location: [facility];  Service: Gynecology;  Laterality: N/A;   EYE SURGERY Right 2004   eyelid laceration    LAPAROSCOPIC OVARIAN CYSTECTOMY N/A 07/24/2020   Procedure: Diagnostic laparoscopy, lysis of adhesions;  Surgeon: Will Bonnet, MD;  Location: [facility];  Service: Gynecology;  Laterality: N/A;   TOTAL LAPAROSCOPIC HYSTERECTOMY WITH SALPINGECTOMY Bilateral 11/15/2019   Procedure: TOTAL LAPAROSCOPIC HYSTERECTOMY WITH SALPINGECTOMY;  Surgeon: Homero Fellers, MD;  Location: [facility];  Service: Gynecology;  Laterality: Bilateral;   TUBAL LIGATION      Family History  Adopted: Yes  Problem Relation Age of Onset   Cancer Mother        eye tumor   Diabetes Mother    Lupus Mother    Alcohol abuse Father    Drug abuse Father    Lupus Sister    Depression Sister    Hypertension Brother    Eczema Daughter    ADD / ADHD Daughter    ADD / ADHD Son    Allergic  Disorder Son    Breast cancer Neg Hx     Social History   Tobacco Use   Smoking status: Never   Smokeless tobacco: Never  Substance Use Topics   Alcohol use: Yes    Alcohol/week: 0.0 standard drinks    Comment: socially     Current Outpatient Medications:    ascorbic acid (VITAMIN C) 500 MG tablet, Take 500 mg by mouth daily., Disp: , Rfl:    Cyanocobalamin (B-12) 1000 MCG SUBL, Place 1 tablet under the tongue daily. (Patient taking differently: Place 1,000 mcg under the tongue daily.), Disp: 100 tablet, Rfl: 1   docusate sodium (COLACE) 100 MG capsule, TAKE 1 CAPSULE (100 MG  TOTAL) BY MOUTH 2 (TWO) TIMES DAILY AS NEEDED FOR MILD CONSTIPATION., Disp: 30 capsule, Rfl: 0   ibuprofen (ADVIL,MOTRIN) 600 MG tablet, Take 1 tablet (600 mg total) by mouth every 6 (six) hours as needed for cramping. (Patient taking differently: Take 1,200 mg by mouth every 6 (six) hours as needed (chronic back pain).), Disp: 60 tablet, Rfl: 0   loratadine (CLARITIN) 10 MG tablet, Take 1 tablet (10 mg total) by mouth daily as needed for allergies., Disp: 30 tablet, Rfl: 11   montelukast (SINGULAIR) 10 MG tablet, Take 1 tablet (10 mg total) by mouth at bedtime. (Patient taking differently: Take 10 mg by mouth at bedtime as needed (allergies.).), Disp: 90 tablet, Rfl: 1   Multiple Vitamin (MULTIVITAMIN WITH MINERALS) TABS tablet, Take 1 tablet by mouth daily., Disp: , Rfl:    Omega-3 Fatty Acids (FISH OIL) 1000 MG CAPS, Take 1,000 mg by mouth every Sunday. , Disp: , Rfl:    simethicone (MYLICON) 80 MG chewable tablet, Chew 1 tablet (80 mg total) by mouth 4 (four) times daily., Disp: 30 tablet, Rfl: 0   valsartan-hydrochlorothiazide (DIOVAN-HCT) 160-25 MG tablet, Take 1 tablet by mouth daily., Disp: 90 tablet, Rfl: 0   Vitamin D, Ergocalciferol, (DRISDOL) 1.25 MG (50000 UNIT) CAPS capsule, TAKE 1 CAPSULE (50,000 UNITS TOTAL) BY MOUTH EVERY SUNDAY., Disp: 12 capsule, Rfl: 1  Allergies  Allergen Reactions   Cherry Hives and Swelling    Fresh cherries with a stem   Morphine And Related     Pt doesn't like the way it makes her feel when coming off it. If has to have, will use.    I personally reviewed active problem list, medication list, allergies, family history, social history, health maintenance with the patient/caregiver today.   ROS  Constitutional: Negative for fever or weight change.  Respiratory: Negative for cough and shortness of breath.   Cardiovascular: Negative for chest pain or palpitations.  Gastrointestinal: Negative for abdominal pain, no bowel changes.  Musculoskeletal:  Negative for gait problem or joint swelling.  Skin: Negative for rash.  Neurological: Negative for dizziness or headache.  No other specific complaints in a complete review of systems (except as listed in HPI above).   Objective  Vitals:   01/23/21 1408  BP: 112/78  Pulse: 77  Resp: 16  Temp: 97.8 F (36.6 C)  TempSrc: Oral  SpO2: 98%  Weight: 185 lb (83.9 kg)  Height: 5' (1.524 m)    Body mass index is 36.13 kg/m.  Physical Exam  Constitutional: Patient appears well-developed and well-nourished. Obese  No distress.  HEENT: head atraumatic, normocephalic, pupils equal and reactive to light, neck supple Cardiovascular: Normal rate, regular rhythm and normal heart sounds.  No murmur heard. No BLE edema. Pulmonary/Chest: Effort normal and breath sounds normal. No  respiratory distress. Abdominal: Soft.  There is no tenderness. Muscular skeletal: no pain during palpation of upper back  Psychiatric: Patient has a normal mood and affect. behavior is normal. Judgment and thought content normal.   PHQ2/9: Depression screen [facility] 2/9 01/23/2021 09/21/2020 08/06/2020 07/15/2019 07/13/2018  Decreased Interest 1 1 0 0 0  Down, Depressed, Hopeless 1 2 1  0 0  PHQ - 2 Score 2 3 1  0 0  Altered sleeping 3 3 - 2 0  Tired, decreased energy 2 3 - 0 0  Change in appetite 0 0 - 0 0  Feeling bad or failure about yourself  0 1 - 0 0  Trouble concentrating 0 3 - 0 0  Moving slowly or fidgety/restless 1 0 - 0 0  Suicidal thoughts 0 0 - 0 0  PHQ-9 Score 8 13 - 2 0  Difficult doing work/chores Somewhat difficult Somewhat difficult - Not difficult at all -  Some recent data might be hidden    phq 9 is positive   Fall Risk: Fall Risk  01/23/2021 09/21/2020 08/06/2020 07/15/2019 07/13/2018  Falls in the past year? 0 0 1 1 1   Number falls in past yr: - 0 0 0 1  Injury with Fall? - 0 1 1 1   Risk for fall due to : - No Fall Risks History of fall(s) - -  Follow up Falls prevention discussed Falls prevention  discussed Falls evaluation completed - -     Functional Status Survey: Is the patient deaf or have difficulty hearing?: No Does the patient have difficulty seeing, even when wearing glasses/contacts?: No Does the patient have difficulty concentrating, remembering, or making decisions?: No Does the patient have difficulty walking or climbing stairs?: Yes Does the patient have difficulty dressing or bathing?: No Does the patient have difficulty doing errands alone such as visiting a doctor's office or shopping?: No    Assessment & Plan   1. Hypertension, benign  - valsartan-hydrochlorothiazide (DIOVAN-HCT) 160-25 MG tablet; Take 1 tablet by mouth daily.  Dispense: 90 tablet; Refill: 1  2. Vitamin D deficiency  - Vitamin D, Ergocalciferol, (DRISDOL) 1.25 MG (50000 UNIT) CAPS capsule; Take 1 capsule (50,000 Units total) by mouth every Sunday.  Dispense: 12 capsule; Refill: 1  3. Kidney stone on left side   4. Obesity (BMI 35.0-39.9 without comorbidity)   5. Large breasts  - Ambulatory referral to Plastic Surgery  6. B12 deficiency

## 2021-01-23 ENCOUNTER — Encounter: Payer: Self-pay | Admitting: Family Medicine

## 2021-01-23 ENCOUNTER — Ambulatory Visit (INDEPENDENT_AMBULATORY_CARE_PROVIDER_SITE_OTHER): Payer: Managed Care, Other (non HMO) | Admitting: Family Medicine

## 2021-01-23 VITALS — BP 112/78 | HR 77 | Temp 97.8°F | Resp 16 | Ht 60.0 in | Wt 185.0 lb

## 2021-01-23 DIAGNOSIS — N62 Hypertrophy of breast: Secondary | ICD-10-CM

## 2021-01-23 DIAGNOSIS — E669 Obesity, unspecified: Secondary | ICD-10-CM | POA: Diagnosis not present

## 2021-01-23 DIAGNOSIS — E559 Vitamin D deficiency, unspecified: Secondary | ICD-10-CM | POA: Diagnosis not present

## 2021-01-23 DIAGNOSIS — I10 Essential (primary) hypertension: Secondary | ICD-10-CM | POA: Diagnosis not present

## 2021-01-23 DIAGNOSIS — N200 Calculus of kidney: Secondary | ICD-10-CM

## 2021-01-23 DIAGNOSIS — E538 Deficiency of other specified B group vitamins: Secondary | ICD-10-CM

## 2021-01-23 MED ORDER — VITAMIN D (ERGOCALCIFEROL) 1.25 MG (50000 UNIT) PO CAPS: 50000.0000 [IU] | ORAL_CAPSULE | ORAL | 1 refills | Status: DC

## 2021-01-23 MED ORDER — VALSARTAN-HYDROCHLOROTHIAZIDE 160-25 MG PO TABS: 1.0000 | ORAL_TABLET | Freq: Every day | ORAL | 1 refills | Status: DC

## 2021-02-06 ENCOUNTER — Encounter: Payer: Self-pay | Admitting: Plastic Surgery

## 2021-02-06 ENCOUNTER — Other Ambulatory Visit: Payer: Self-pay

## 2021-02-06 ENCOUNTER — Ambulatory Visit (INDEPENDENT_AMBULATORY_CARE_PROVIDER_SITE_OTHER): Payer: Managed Care, Other (non HMO) | Admitting: Plastic Surgery

## 2021-02-06 VITALS — BP 132/89 | HR 77 | Ht 60.0 in | Wt 186.0 lb

## 2021-02-06 DIAGNOSIS — G8929 Other chronic pain: Secondary | ICD-10-CM

## 2021-02-06 DIAGNOSIS — N62 Hypertrophy of breast: Secondary | ICD-10-CM

## 2021-02-06 DIAGNOSIS — M546 Pain in thoracic spine: Secondary | ICD-10-CM
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
## 2021-02-15 ENCOUNTER — Ambulatory Visit
Admission: RE | Admit: 2021-02-15 | Discharge: 2021-02-15 | Disposition: A | Discharge status: Home / Self Care | Payer: Managed Care, Other (non HMO) | Source: Ambulatory Visit | Attending: Family Medicine | Admitting: Family Medicine

## 2021-02-15 ENCOUNTER — Other Ambulatory Visit: Payer: Self-pay

## 2021-02-15 DIAGNOSIS — Z1231 Encounter for screening mammogram for malignant neoplasm of breast: Secondary | ICD-10-CM | POA: Diagnosis present

## 2021-02-15 IMAGING — MG MM DIGITAL SCREENING BILAT W/ TOMO AND CAD
6 of 10 series · 6 of 30 positions shown · non-contrast
Comparison: Previous exam(s).

CLINICAL DATA: Screening.

EXAM:
DIGITAL SCREENING BILATERAL MAMMOGRAM WITH TOMOSYNTHESIS AND CAD
TECHNIQUE: Bilateral screening digital craniocaudal and mediolateral oblique
mammograms were obtained. Bilateral screening digital breast
tomosynthesis was performed. The images were evaluated with
computer-aided detection.

[L MLO synth-2D]
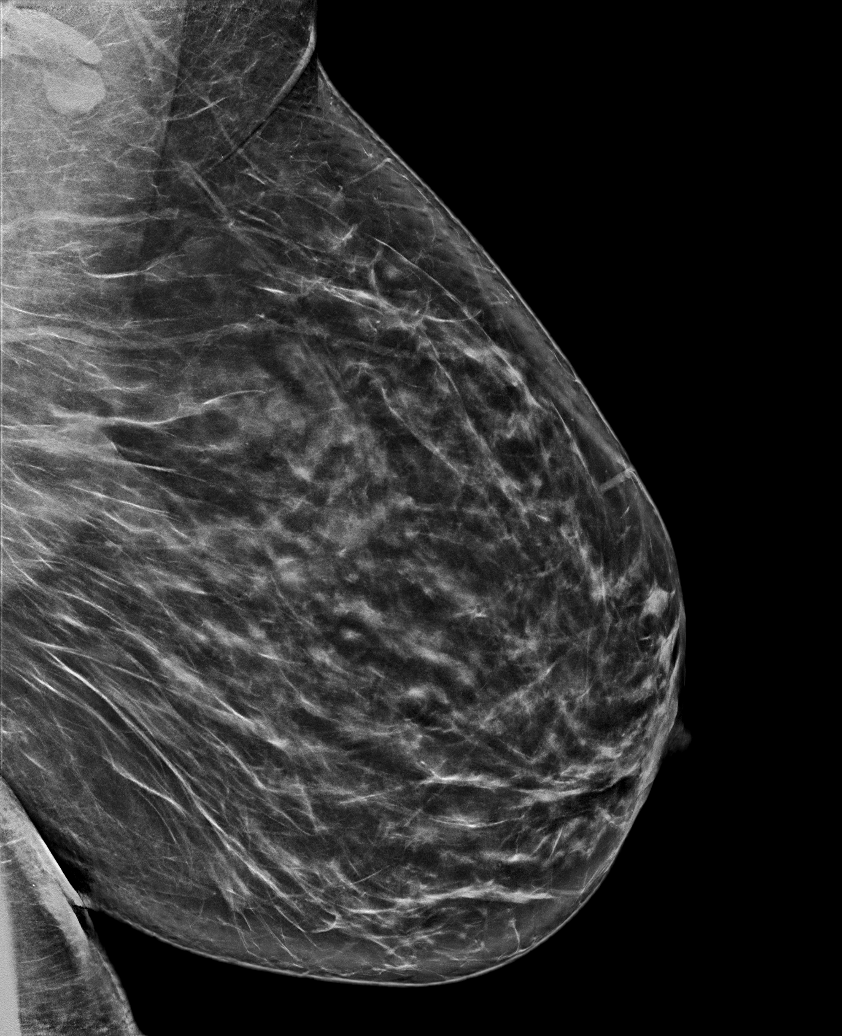

[L CC synth-2D]
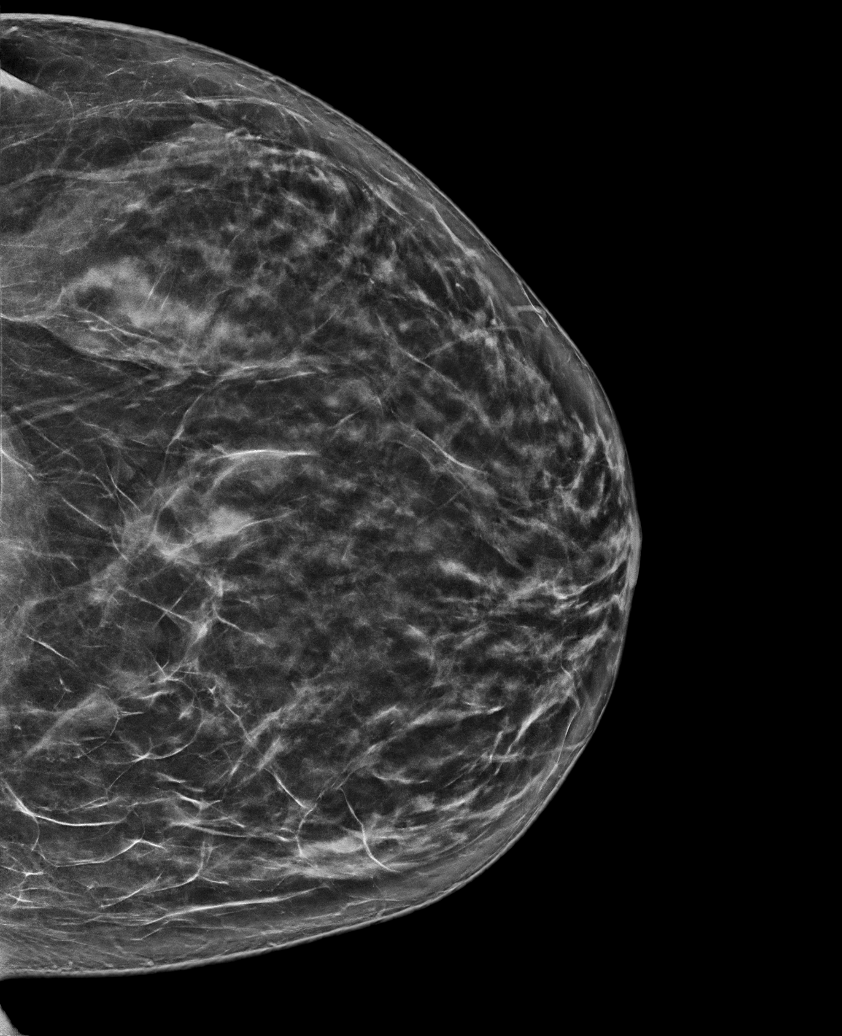

[R CC synth-2D]
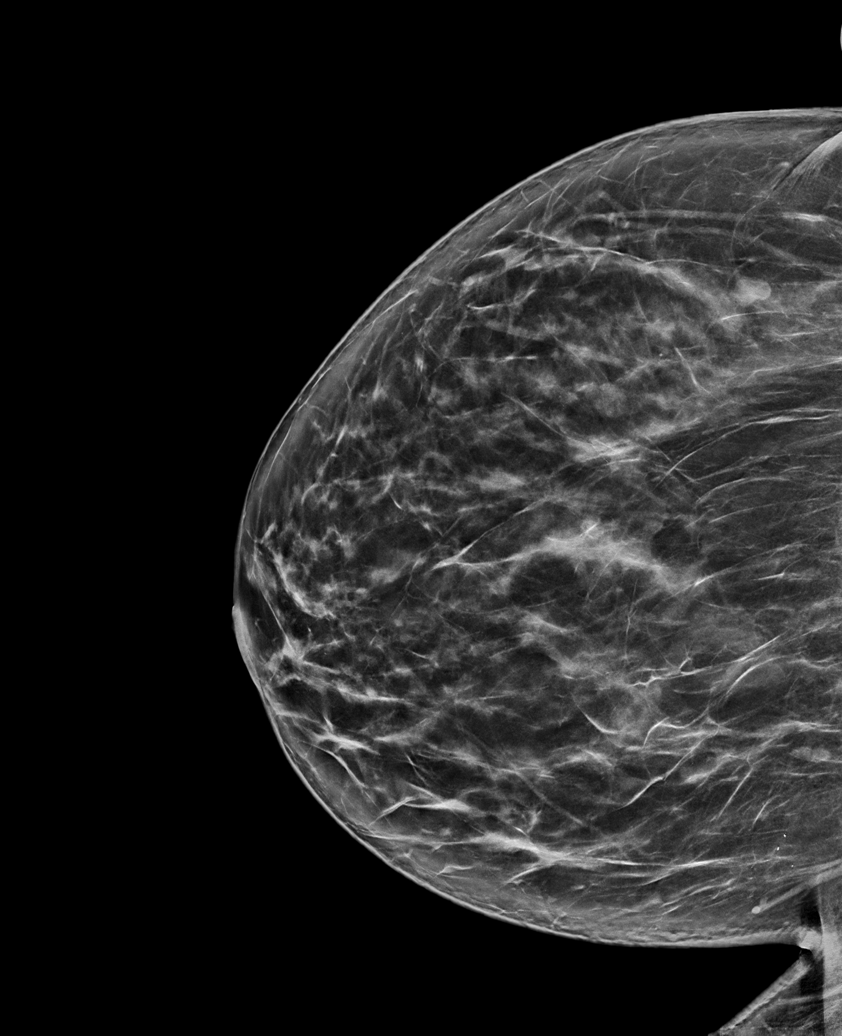

[R MLO synth-2D (1 of 2)]
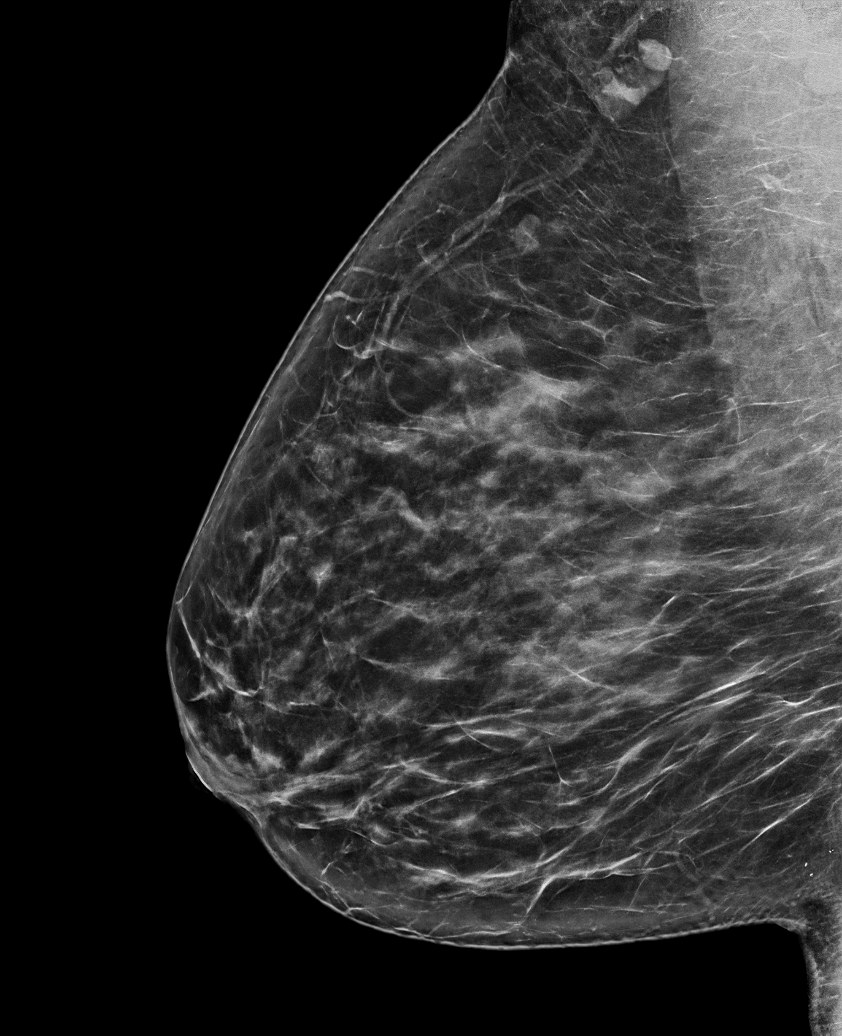

[R MLO synth-2D (2 of 2)]
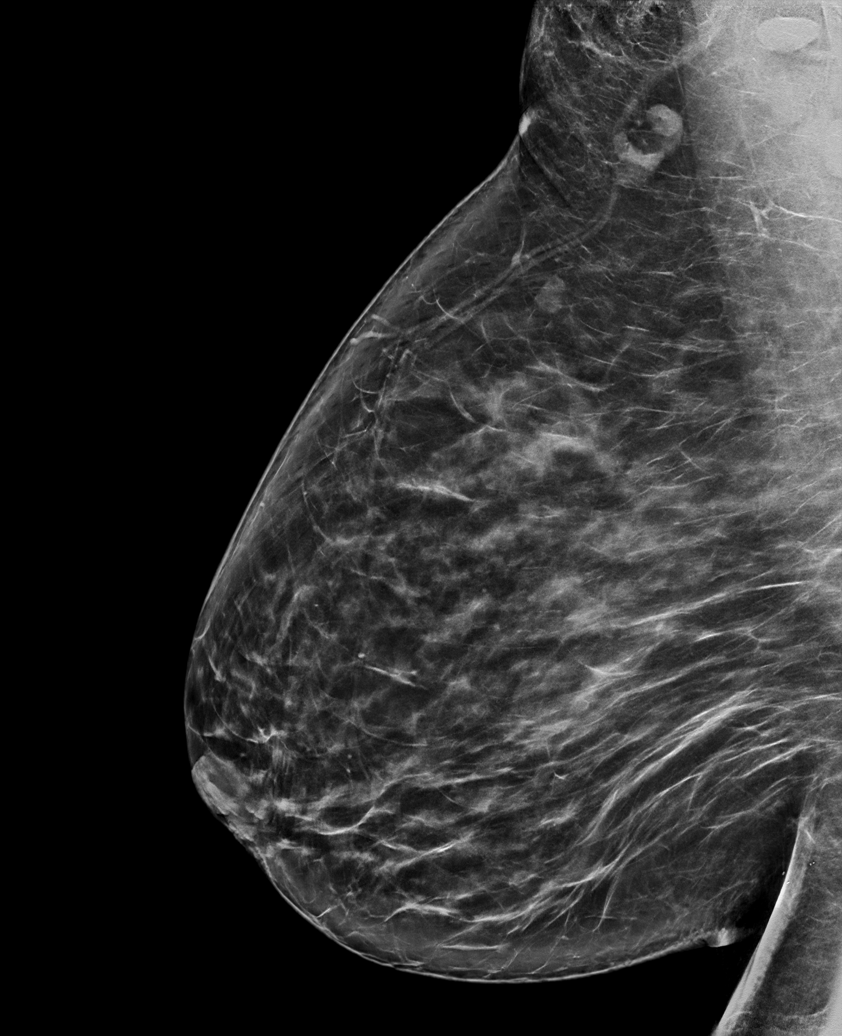

[L MLO tomo · tomo slice 43/85.0]
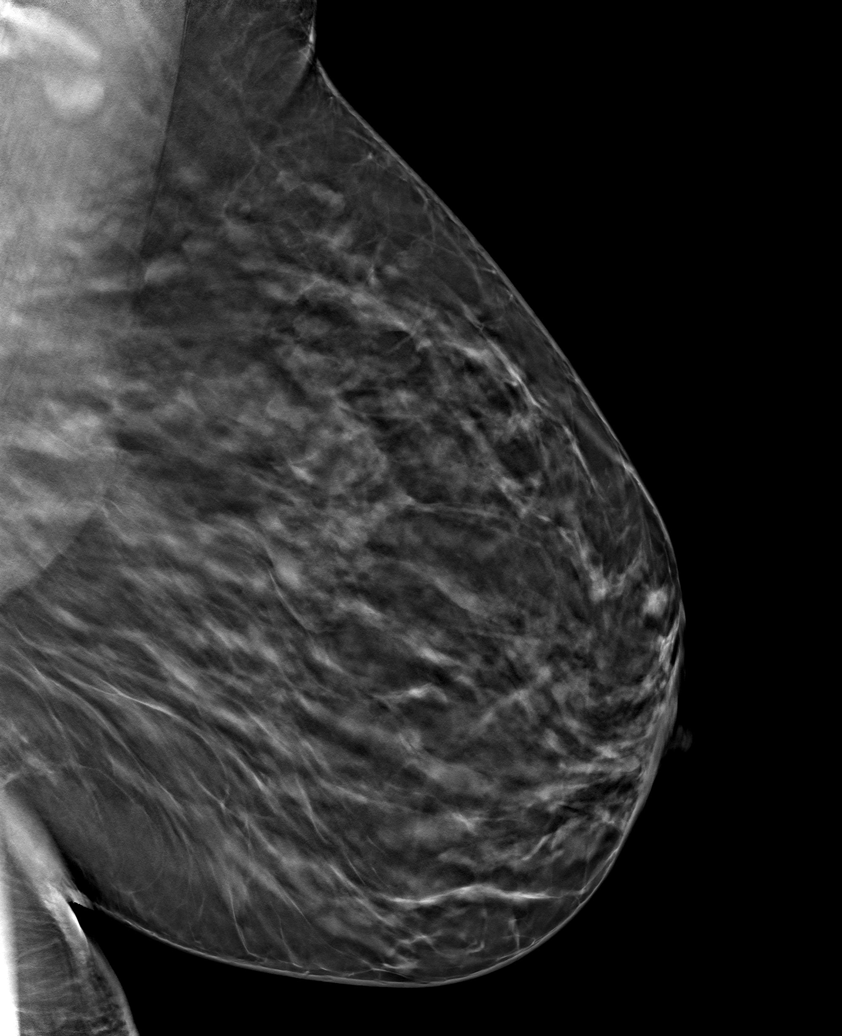

[6 of 30 positions shown; findings below may reference images not displayed]

ACR Breast Density Category c: The breast tissue is heterogeneously
dense, which may obscure small masses.
FINDINGS: There are no findings suspicious for malignancy.
IMPRESSION: No mammographic evidence of malignancy. A result letter of this
screening mammogram will be mailed directly to the patient.

RECOMMENDATION:
Screening mammogram in one year. (Code:[V2])

BI-RADS CATEGORY  1: Negative.

## 2021-02-18 ENCOUNTER — Telehealth: Payer: Self-pay | Admitting: Plastic Surgery

## 2021-02-18 NOTE — Telephone Encounter (Signed)
Left message for patient to call back or respond to [employer]. Unable to identify if voicemail belonged to patient only so didn't leave any detailed information. Mychart details purpose of phone call.

## 2021-02-19 ENCOUNTER — Telehealth: Payer: Self-pay

## 2021-02-19 NOTE — Telephone Encounter (Signed)
Copied from [place] [id number]. Topic: Referral - Question >> Feb 19, 2021  4:03 PM Pawlus, Brayton Layman A wrote: Reason for CRM: Pt called in regarding her referral to [facility] [place], pt stated additional notes need to be sent over showing she has also done PT, please advise.

## 2021-05-01 ENCOUNTER — Telehealth: Payer: Self-pay | Admitting: Plastic Surgery

## 2021-05-01 NOTE — Telephone Encounter (Signed)
Received letter in the mail from [employer] requesting additional information. Called customer service number and spoke with Kayleen Memos. He advised that the clinical information was incomplete and to send via fax. Will resent information to [employer] to fax # [phone]. ?

## 2021-05-06 ENCOUNTER — Telehealth: Payer: Self-pay

## 2021-05-06 NOTE — Telephone Encounter (Signed)
Called patient, and advise her insurance denied authorization. They do not cover services  no matter what the reasons are being requested. ?

## 2021-07-23 ENCOUNTER — Other Ambulatory Visit: Payer: Self-pay | Admitting: Family Medicine

## 2021-07-23 DIAGNOSIS — I10 Essential (primary) hypertension: Secondary | ICD-10-CM

## 2021-07-26 ENCOUNTER — Ambulatory Visit: Payer: Managed Care, Other (non HMO) | Admitting: Family Medicine

## 2021-08-01 ENCOUNTER — Other Ambulatory Visit: Payer: Self-pay | Admitting: Family Medicine

## 2021-08-01 DIAGNOSIS — E559 Vitamin D deficiency, unspecified: Secondary | ICD-10-CM

## 2021-09-25 NOTE — Patient Instructions (Signed)
Preventive Care 42-42 Years Old, Female Preventive care refers to lifestyle choices and visits with your health care provider that can promote health and wellness. Preventive care visits are also called wellness exams. What can I expect for my preventive care visit? Counseling Your health care provider may ask you questions about your: Medical history, including: Past medical problems. Family medical history. Pregnancy history. Current health, including: Menstrual cycle. Method of birth control. Emotional well-being. Home life and relationship well-being. Sexual activity and sexual health. Lifestyle, including: Alcohol, nicotine or tobacco, and drug use. Access to firearms. Diet, exercise, and sleep habits. Work and work environment. Sunscreen use. Safety issues such as seatbelt and bike helmet use. Physical exam Your health care provider will check your: Height and weight. These may be used to calculate your BMI (body mass index). BMI is a measurement that tells if you are at a healthy weight. Waist circumference. This measures the distance around your waistline. This measurement also tells if you are at a healthy weight and may help predict your risk of certain diseases, such as type 2 diabetes and high blood pressure. Heart rate and blood pressure. Body temperature. Skin for abnormal spots. What immunizations do I need?  Vaccines are usually given at various ages, according to a schedule. Your health care provider will recommend vaccines for you based on your age, medical history, and lifestyle or other factors, such as travel or where you work. What tests do I need? Screening Your health care provider may recommend screening tests for certain conditions. This may include: Lipid and cholesterol levels. Diabetes screening. This is done by checking your blood sugar (glucose) after you have not eaten for a while (fasting). Pelvic exam and Pap test. Hepatitis B test. Hepatitis C  test. HIV (human immunodeficiency virus) test. STI (sexually transmitted infection) testing, if you are at risk. Lung cancer screening. Colorectal cancer screening. Mammogram. Talk with your health care provider about when you should start having regular mammograms. This may depend on whether you have a family history of breast cancer. BRCA-related cancer screening. This may be done if you have a family history of breast, ovarian, tubal, or peritoneal cancers. Bone density scan. This is done to screen for osteoporosis. Talk with your health care provider about your test results, treatment options, and if necessary, the need for more tests. Follow these instructions at home: Eating and drinking  Eat a diet that includes fresh fruits and vegetables, whole grains, lean protein, and low-fat dairy products. Take vitamin and mineral supplements as recommended by your health care provider. Do not drink alcohol if: Your health care provider tells you not to drink. You are pregnant, may be pregnant, or are planning to become pregnant. If you drink alcohol: Limit how much you have to 0-1 drink a day. Know how much alcohol is in your drink. In the [place] one drink equals one 12 oz bottle of beer (355 mL), one 5 oz glass of wine (148 mL), or one 1 oz glass of hard liquor (44 mL). Lifestyle Brush your teeth every morning and night with fluoride toothpaste. Floss one time each day. Exercise for at least 30 minutes 5 or more days each week. Do not use any products that contain nicotine or tobacco. These products include cigarettes, chewing tobacco, and vaping devices, such as e-cigarettes. If you need help quitting, ask your health care provider. Do not use drugs. If you are sexually active, practice safe sex. Use a condom or other form of protection to   prevent STIs. If you do not wish to become pregnant, use a form of birth control. If you plan to become pregnant, see your health care provider for a  prepregnancy visit. Take aspirin only as told by your health care provider. Make sure that you understand how much to take and what form to take. Work with your health care provider to find out whether it is safe and beneficial for you to take aspirin daily. Find healthy ways to manage stress, such as: Meditation, yoga, or listening to music. Journaling. Talking to a trusted person. Spending time with friends and family. Minimize exposure to UV radiation to reduce your risk of skin cancer. Safety Always wear your seat belt while driving or riding in a vehicle. Do not drive: If you have been drinking alcohol. Do not ride with someone who has been drinking. When you are tired or distracted. While texting. If you have been using any mind-altering substances or drugs. Wear a helmet and other protective equipment during sports activities. If you have firearms in your house, make sure you follow all gun safety procedures. Seek help if you have been physically or sexually abused. What's next? Visit your health care provider once a year for an annual wellness visit. Ask your health care provider how often you should have your eyes and teeth checked. Stay up to date on all vaccines. This information is not intended to replace advice given to you by your health care provider. Make sure you discuss any questions you have with your health care provider. Document Revised: 07/25/2020 Document Reviewed: 07/25/2020 Elsevier Patient Education  [place].

## 2021-09-25 NOTE — Progress Notes (Unsigned)
Name: Anita Newton   MRN: [id number]    DOB: 1980-04-17   Date:09/26/2021       Progress Note  Subjective  Chief Complaint  Annual Exam  HPI  Patient presents for annual CPE.  Diet: she tries to eat healthy , avoids fried food , lots of fruit and vegetables  Exercise: continue regular physical activity   Last Eye Exam: every 2 years, she is due for an eye exam now  Last Dental Exam: she goes every 6 months   [place] Visit from 01/23/2021 in [facility]  AUDIT-C Score 1      Depression: Phq 9 is  positive    09/26/2021    9:16 AM 01/23/2021    2:10 PM 09/21/2020    1:51 PM 08/06/2020    8:42 AM 07/15/2019    8:11 AM  Depression screen PHQ 2/9  Decreased Interest _0 0 0  Down, Depressed, Hopeless 0 _1 0  PHQ - 2 Score _2 0  Altered sleeping _3 Tired, decreased energy _4 0  Change in appetite 0 0 0  0  Feeling bad or failure about yourself  0 0 1  0  Trouble concentrating 0 0 3  0  Moving slowly or fidgety/restless 0 1 0  0  Suicidal thoughts 0 0 0  0  PHQ-9 Score _5 Difficult doing work/chores Not difficult at all Somewhat difficult Somewhat difficult  Not difficult at all   Hypertension: BP Readings from Last 3 Encounters:  09/26/21 124/76  02/06/21 132/89  01/23/21 112/78   Obesity: Wt Readings from Last 3 Encounters:  09/26/21 188 lb 9.6 oz (85.5 kg)  02/06/21 186 lb (84.4 kg)  01/23/21 185 lb (83.9 kg)   BMI Readings from Last 3 Encounters:  09/26/21 36.83 kg/m  02/06/21 36.33 kg/m  01/23/21 36.13 kg/m     Vaccines:   Tdap: up to date Shingrix: N/A Pneumonia: N/A HLK:[id number]  COVID-19: she will get the booster this Fall    Hep C Screening: 07/10/17 STD testing and prevention (HIV/chl/gon/syphilis): 09/21/20 Intimate partner violence: negative screen  Sexual History : female partner, same person for the past 3 years  Menstrual History/LMP/Abnormal Bleeding: s/p hysterectomy   Discussed importance of follow up if any post-menopausal bleeding: not applicable  Incontinence Symptoms: she has urgency since hysterectomy , she does not want medications, she is working on [employer], does not want PT   Breast cancer:  - Last Mammogram: 02/15/21 - BRCA gene screening: N/A  Osteoporosis Prevention : Discussed high calcium and vitamin D supplementation, weight bearing exercises Bone density: not applicable   Cervical cancer screening: 07/15/19 - S/p hysterectomy in 11/2019   Skin cancer: Discussed monitoring for atypical lesions  Colorectal cancer: N/A   Lung cancer:  Low Dose CT Chest recommended if Age 62-80 years, 20 pack-year currently smoking OR have quit w/in 15years. Patient does not qualify for screen   ECG: 07/23/20  Advanced Care Planning: A voluntary discussion about advance care planning including the explanation and discussion of advance directives.  Discussed health care proxy and Living will, and the patient was able to identify a health care proxy as Anita Newton - youngest daughter .  Patient does not have a living will and power of attorney of health care   Lipids: Lab Results  Component Value Date   CHOL 161 09/21/2020  CHOL 169 07/20/2019   CHOL 150 07/20/2018   Lab Results  Component Value Date   HDL 53 09/21/2020   HDL 43 07/20/2019   HDL 48 07/20/2018   Lab Results  Component Value Date   LDLCALC 93 09/21/2020   LDLCALC 99 07/20/2019   LDLCALC 84 07/20/2018   Lab Results  Component Value Date   TRIG 65 09/21/2020   TRIG 156 (H) 07/20/2019   TRIG 92 07/20/2018   Lab Results  Component Value Date   CHOLHDL 3.0 09/21/2020   CHOLHDL 3.9 07/20/2019   CHOLHDL 3.4 07/10/2017   No results found for: "LDLDIRECT"  Glucose: Glucose, Bld  Date Value Ref Range Status  09/21/2020 72 65 - 99 mg/dL Final    Comment:    .            Fasting reference interval .   07/25/2020 143 (H) 70 - 99 mg/dL Final    Comment:    Glucose reference  range applies only to samples taken after fasting for at least 8 hours.  07/24/2020 95 70 - 99 mg/dL Final    Comment:    Glucose reference range applies only to samples taken after fasting for at least 8 hours.   Glucose-Capillary  Date Value Ref Range Status  07/24/2020 91 70 - 99 mg/dL Final    Comment:    Glucose reference range applies only to samples taken after fasting for at least 8 hours.    Patient Active Problem List   Diagnosis Date Noted   Genetic testing 10/26/2020   Family history unknown 10/09/2020   Ovarian cyst 07/23/2020   S/P laparoscopic hysterectomy 11/15/2019   Menorrhagia with regular cycle    Peroneal tendinitis 10/31/2019   Synovitis and tenosynovitis 10/31/2019   Morbid obesity ([place]) 04/16/2018   B12 deficiency 07/08/2016   History of iron deficiency anemia 07/08/2016   Seasonal allergic rhinitis 05/16/2015   History of eclampsia 12/15/2014   Hypertension, benign 12/15/2014   Large breasts 12/15/2014   Obesity (BMI 35.0-39.9 without comorbidity) 12/15/2014   Thoracic back pain 12/15/2014   Vitamin D deficiency 12/15/2014    Past Surgical History:  Procedure Laterality Date   CYSTOSCOPY N/A 11/15/2019   Procedure: CYSTOSCOPY;  Surgeon: Homero Fellers, MD;  Location: [facility] ORS;  Service: Gynecology;  Laterality: N/A;   DILATATION & CURETTAGE/HYSTEROSCOPY WITH MYOSURE N/A 10/06/2017   Procedure: DILATATION & CURETTAGE /HYSTEROSCOPY;  Surgeon: Homero Fellers, MD;  Location: [facility] OR[facility];  Service: Gynecology;  Laterality: N/A;   EYE SURGERY Right 2004   eyelid laceration    LAPAROSCOPIC OVARIAN CYSTECTOMY N/A 07/24/2020   Procedure: Diagnostic laparoscopy, lysis of adhesions;  Surgeon: Will Bonnet, MD;  Location: [facility] ORS;  Service: Gynecology;  Laterality: N/A;   TOTAL LAPAROSCOPIC HYSTERECTOMY WITH SALPINGECTOMY Bilateral 11/15/2019   Procedure: TOTAL LAPAROSCOPIC HYSTERECTOMY WITH SALPINGECTOMY;  Surgeon: Homero Fellers, MD;   Location: [facility] OR[facility];  Service: Gynecology;  Laterality: Bilateral;   TUBAL LIGATION      Family History  Adopted: Yes  Problem Relation Age of Onset   Cancer Mother        eye tumor   Diabetes Mother    Lupus Mother    Alcohol abuse Father    Drug abuse Father    Lupus Sister    Depression Sister    Hypertension Brother    Eczema Daughter    ADD / ADHD Daughter    ADD / ADHD Son    Allergic Disorder  Son    Breast cancer Neg Hx     Social History   Socioeconomic History   Marital status: Single    Spouse name: Not on file   Number of children: 3   Years of education: Not on file   Highest education level: Some college, no degree  Occupational History   Not on file  Tobacco Use   Smoking status: Never   Smokeless tobacco: Never  Vaping Use   Vaping Use: Never used  Substance and Sexual Activity   Alcohol use: Yes    Alcohol/week: 0.0 standard drinks of alcohol    Comment: socially   Drug use: No   Sexual activity: Yes    Birth control/protection: Condom, Injection, Surgical    Comment: Tubal Ligation   Other Topics Concern   Not on file  Social History Narrative   Not on file   Social Determinants of Health   Financial Resource Strain: High Risk (09/26/2021)   Overall Financial Resource Strain (CARDIA)    Difficulty of Paying Living Expenses: Very hard  Food Insecurity: Food Insecurity Present (09/26/2021)   Hunger Vital Sign    Worried About Running Out of Food in the Last Year: Often true    Ran Out of Food in the Last Year: Never true  Transportation Needs: No Transportation Needs (09/26/2021)   PRAPARE - Hydrologist (Medical): No    Lack of Transportation (Non-Medical): No  Physical Activity: Sufficiently Active (09/26/2021)   Exercise Vital Sign    Days of Exercise per Week: 5 days    Minutes of Exercise per Session: 40 min  Stress: Stress Concern Present (09/26/2021)   [place]    Feeling of Stress : Very much  Social Connections: Socially Isolated (09/26/2021)   Social Connection and Isolation Panel [NHANES]    Frequency of Communication with Friends and Family: Twice a week    Frequency of Social Gatherings with Friends and Family: Once a week    Attends Religious Services: Never    Marine scientist or Organizations: Patient refused    Attends Archivist Meetings: Never    Marital Status: Divorced  Human resources officer Violence: Not At Risk (09/26/2021)   Humiliation, Afraid, Rape, and Kick questionnaire    Fear of Current or Ex-Partner: No    Emotionally Abused: No    Physically Abused: No    Sexually Abused: No     Current Outpatient Medications:    ascorbic acid (VITAMIN C) 500 MG tablet, Take 500 mg by mouth daily., Disp: , Rfl:    Cyanocobalamin (B-12) 1000 MCG SUBL, Place 1 tablet under the tongue daily. (Patient taking differently: Place 1,000 mcg under the tongue daily.), Disp: 100 tablet, Rfl: 1   docusate sodium (COLACE) 100 MG capsule, TAKE 1 CAPSULE (100 MG TOTAL) BY MOUTH 2 (TWO) TIMES DAILY AS NEEDED FOR MILD CONSTIPATION., Disp: 30 capsule, Rfl: 0   ibuprofen (ADVIL,MOTRIN) 600 MG tablet, Take 1 tablet (600 mg total) by mouth every 6 (six) hours as needed for cramping. (Patient taking differently: Take 1,200 mg by mouth every 6 (six) hours as needed (chronic back pain).), Disp: 60 tablet, Rfl: 0   loratadine (CLARITIN) 10 MG tablet, Take 1 tablet (10 mg total) by mouth daily as needed for allergies., Disp: 30 tablet, Rfl: 11   montelukast (SINGULAIR) 10 MG tablet, Take 1 tablet (10 mg total) by mouth at bedtime. (Patient taking  differently: Take 10 mg by mouth at bedtime as needed (allergies.).), Disp: 90 tablet, Rfl: 1   Multiple Vitamin (MULTIVITAMIN WITH MINERALS) TABS tablet, Take 1 tablet by mouth daily., Disp: , Rfl:    Omega-3 Fatty Acids (FISH OIL) 1000 MG CAPS, Take 1,000 mg by mouth every  Sunday. , Disp: , Rfl:    simethicone (MYLICON) 80 MG chewable tablet, Chew 1 tablet (80 mg total) by mouth 4 (four) times daily., Disp: 30 tablet, Rfl: 0   valsartan-hydrochlorothiazide (DIOVAN-HCT) 160-25 MG tablet, TAKE 1 TABLET BY MOUTH EVERY DAY, Disp: 90 tablet, Rfl: 0   Vitamin D, Ergocalciferol, (DRISDOL) 1.25 MG (50000 UNIT) CAPS capsule, TAKE 1 CAPSULE (50,000 UNITS TOTAL) BY MOUTH EVERY SUNDAY., Disp: 12 capsule, Rfl: 1  Allergies  Allergen Reactions   Cherry Hives and Swelling    Fresh cherries with a stem   Morphine And Related     Pt doesn't like the way it makes her feel when coming off it. If has to have, will use.     ROS  Constitutional: Negative for fever or weight change.  Respiratory: Negative for cough and shortness of breath.   Cardiovascular: Negative for chest pain or palpitations.  Gastrointestinal: Negative for abdominal pain, no bowel changes.  Musculoskeletal: Negative for gait problem or joint swelling.  Skin: Negative for rash.  Neurological: Negative for dizziness or headache.  No other specific complaints in a complete review of systems (except as listed in HPI above).   Objective  Vitals:   09/26/21 0903  BP: 124/76  Pulse: 82  Resp: 16  Temp: 98.1 F (36.7 C)  TempSrc: Oral  SpO2: 99%  Weight: 188 lb 9.6 oz (85.5 kg)  Height: 5' (1.524 m)    Body mass index is 36.83 kg/m.  Physical Exam  Constitutional: Patient appears well-developed and well-nourished. No distress.  HENT: Head: Normocephalic and atraumatic. Ears: B TMs ok, no erythema or effusion; Nose: Nose normal. Mouth/Throat: Oropharynx is clear and moist. No oropharyngeal exudate.  Eyes: Conjunctivae and EOM are normal. Pupils are equal, round, and reactive to light. No scleral icterus.  Neck: Normal range of motion. Neck supple. No JVD present. No thyromegaly present.  Cardiovascular: Normal rate, regular rhythm and normal heart sounds.  No murmur heard. No BLE  edema. Pulmonary/Chest: Effort normal and breath sounds normal. No respiratory distress. Abdominal: Soft. Bowel sounds are normal, no distension. There is no tenderness. no masses Breast:lumpy , no nipple discharge or rashes FEMALE GENITALIA:  External genitalia normal External urethra normal Vaginal vault normal without discharge or lesions Cervix absent  Bimanual exam normal without masses RECTAL: not done  Musculoskeletal: Normal range of motion, no joint effusions. No gross deformities Neurological: he is alert and oriented to person, place, and time. No cranial nerve deficit. Coordination, balance, strength, speech and gait are normal.  Skin: Skin is warm and dry. No rash noted. No erythema.  Psychiatric: Patient has a normal mood and affect. behavior is normal. Judgment and thought content normal.   Fall Risk:    09/26/2021    9:16 AM 01/23/2021    1:59 PM 09/21/2020    1:51 PM 08/06/2020    8:41 AM 07/15/2019    8:11 AM  Fall Risk   Falls in the past year? 0 0 0 1 1  Number falls in past yr: 0  0 0 0  Injury with Fall? 0  0 1 1  Risk for fall due to :   No Fall Risks  History of fall(s)   Follow up  Falls prevention discussed Falls prevention discussed Falls evaluation completed      Functional Status Survey: Is the patient deaf or have difficulty hearing?: No Does the patient have difficulty seeing, even when wearing glasses/contacts?: No Does the patient have difficulty concentrating, remembering, or making decisions?: No Does the patient have difficulty walking or climbing stairs?: No Does the patient have difficulty dressing or bathing?: No Does the patient have difficulty doing errands alone such as visiting a doctor's office or shopping?: No   Assessment & Plan  1. Well adult exam  - Lipid panel - CBC with Differential/Platelet - COMPLETE METABOLIC PANEL WITH GFR - Vitamin B12 - VITAMIN D 25 Hydroxy (Vit-D Deficiency, Fractures) - Hemoglobin A1c - HIV  Antibody (routine testing w rflx) - RPR - Cervicovaginal ancillary only  2. Diabetes mellitus screening  - Hemoglobin A1c  3. B12 deficiency  - Vitamin B12  4. Vitamin D deficiency  - VITAMIN D 25 Hydroxy (Vit-D Deficiency, Fractures)  5. Anemia, unspecified type  - CBC with Differential/Platelet  6. Low TSH level  - TSH  7. Lipid screening  - Lipid panel  8. Routine screening for STI (sexually transmitted infection)  - HIV Antibody (routine testing w rflx) - RPR - Cervicovaginal ancillary only    -USPSTF grade A and B recommendations reviewed with patient; age-appropriate recommendations, preventive care, screening tests, etc discussed and encouraged; healthy living encouraged; see AVS for patient education given to patient -Discussed importance of 150 minutes of physical activity weekly, eat two servings of fish weekly, eat one serving of tree nuts ( cashews, pistachios, pecans, almonds.Marland Kitchen) every other day, eat 6 servings of fruit/vegetables daily and drink plenty of water and avoid sweet beverages.   -Reviewed Health Maintenance: Yes.

## 2021-09-26 ENCOUNTER — Other Ambulatory Visit (HOSPITAL_COMMUNITY)
Admission: RE | Admit: 2021-09-26 | Discharge: 2021-09-26 | Disposition: A | Discharge status: Home / Self Care | Payer: Managed Care, Other (non HMO) | Source: Ambulatory Visit | Attending: Family Medicine | Admitting: Family Medicine

## 2021-09-26 ENCOUNTER — Telehealth: Payer: Self-pay | Admitting: Family Medicine

## 2021-09-26 ENCOUNTER — Ambulatory Visit (INDEPENDENT_AMBULATORY_CARE_PROVIDER_SITE_OTHER): Payer: Managed Care, Other (non HMO) | Admitting: Family Medicine

## 2021-09-26 ENCOUNTER — Encounter: Payer: Self-pay | Admitting: Family Medicine

## 2021-09-26 VITALS — BP 124/76 | HR 82 | Temp 98.1°F | Resp 16 | Ht 60.0 in | Wt 188.6 lb

## 2021-09-26 DIAGNOSIS — E538 Deficiency of other specified B group vitamins: Secondary | ICD-10-CM | POA: Diagnosis not present

## 2021-09-26 DIAGNOSIS — B9689 Other specified bacterial agents as the cause of diseases classified elsewhere: Secondary | ICD-10-CM | POA: Diagnosis not present

## 2021-09-26 DIAGNOSIS — Z113 Encounter for screening for infections with a predominantly sexual mode of transmission: Secondary | ICD-10-CM

## 2021-09-26 DIAGNOSIS — Z0000 Encounter for general adult medical examination without abnormal findings: Secondary | ICD-10-CM | POA: Diagnosis present

## 2021-09-26 DIAGNOSIS — E559 Vitamin D deficiency, unspecified: Secondary | ICD-10-CM | POA: Diagnosis not present

## 2021-09-26 DIAGNOSIS — Z131 Encounter for screening for diabetes mellitus: Secondary | ICD-10-CM

## 2021-09-26 DIAGNOSIS — D649 Anemia, unspecified: Secondary | ICD-10-CM

## 2021-09-26 DIAGNOSIS — N760 Acute vaginitis: Secondary | ICD-10-CM | POA: Diagnosis not present

## 2021-09-26 DIAGNOSIS — R7989 Other specified abnormal findings of blood chemistry: Secondary | ICD-10-CM

## 2021-09-26 DIAGNOSIS — Z13220 Encounter for screening for lipoid disorders: Secondary | ICD-10-CM

## 2021-09-26 NOTE — Telephone Encounter (Signed)
Patient brought in her work wellness form to be completed. Pt informed that form will be completed once the ordered labs have been done.  Form is located in file cabinet at front desk.  Once form is completed call patient for pick up.

## 2021-09-26 NOTE — Telephone Encounter (Signed)
Once form is complete fax to number located at top of form and notify patient that fax went through.

## 2021-09-27 LAB — COMPLETE METABOLIC PANEL WITH GFR
AG Ratio: 1.5 (calc) (ref 1.0–2.5)
ALT: 18 U/L (ref 6–29)
AST: 17 U/L (ref 10–30)
Albumin: 4.5 g/dL (ref 3.6–5.1)
Alkaline phosphatase (APISO): 69 U/L (ref 31–125)
BUN: 14 mg/dL (ref 7–25)
CO2: 29 mmol/L (ref 20–32)
Calcium: 9.4 mg/dL (ref 8.6–10.2)
Chloride: 101 mmol/L (ref 98–110)
Creat: 0.84 mg/dL (ref 0.50–0.99)
Globulin: 3 g/dL (calc) (ref 1.9–3.7)
Glucose, Bld: 86 mg/dL (ref 65–99)
Potassium: 3.7 mmol/L (ref 3.5–5.3)
Sodium: 140 mmol/L (ref 135–146)
Total Bilirubin: 0.7 mg/dL (ref 0.2–1.2)
Total Protein: 7.5 g/dL (ref 6.1–8.1)
eGFR: 89 mL/min/{1.73_m2} (ref >=60)

## 2021-09-27 LAB — CERVICOVAGINAL ANCILLARY ONLY
Bacterial Vaginitis (gardnerella): POSITIVE — AB
Candida Glabrata: NEGATIVE
Candida Vaginitis: NEGATIVE
Chlamydia: NEGATIVE
Comment: NEGATIVE
Comment: NEGATIVE
Comment: NEGATIVE
Comment: NEGATIVE
Comment: NEGATIVE
Comment: NORMAL
Neisseria Gonorrhea: NEGATIVE
Trichomonas: NEGATIVE

## 2021-09-27 LAB — CBC WITH DIFFERENTIAL/PLATELET
Absolute Monocytes: 405 cells/uL (ref 200–950)
Basophils Absolute: 60 cells/uL (ref 0–200)
Basophils Relative: 1.2 %
Eosinophils Absolute: 100 cells/uL (ref 15–500)
Eosinophils Relative: 2 %
HCT: 35.4 % (ref 35.0–45.0)
Hemoglobin: 12 g/dL (ref 11.7–15.5)
Lymphs Abs: 2340 cells/uL (ref 850–3900)
MCH: 29.6 pg (ref 27.0–33.0)
MCHC: 33.9 g/dL (ref 32.0–36.0)
MCV: 87.2 fL (ref 80.0–100.0)
MPV: 10.4 fL (ref 7.5–12.5)
Monocytes Relative: 8.1 %
Neutro Abs: 2095 cells/uL (ref 1500–7800)
Neutrophils Relative %: 41.9 %
Platelets: 324 10*3/uL (ref 140–400)
RBC: 4.06 10*6/uL (ref 3.80–5.10)
RDW: 11.9 % (ref 11.0–15.0)
Total Lymphocyte: 46.8 %
WBC: 5 10*3/uL (ref 3.8–10.8)

## 2021-09-27 LAB — VITAMIN B12: Vitamin B-12: 501 pg/mL (ref 200–1100)

## 2021-09-27 LAB — HIV ANTIBODY (ROUTINE TESTING W REFLEX): HIV 1&2 Ab, 4th Generation: NONREACTIVE

## 2021-09-27 LAB — LIPID PANEL
Cholesterol: 167 mg/dL (ref <200)
HDL: 49 mg/dL — ABNORMAL LOW (ref >=50)
LDL Cholesterol (Calc): 102 mg/dL (calc) — ABNORMAL HIGH
Non-HDL Cholesterol (Calc): 118 mg/dL (calc) (ref <130)
Total CHOL/HDL Ratio: 3.4 (calc) (ref <5.0)
Triglycerides: 69 mg/dL (ref <150)

## 2021-09-27 LAB — HEMOGLOBIN A1C
Hgb A1c MFr Bld: 5.3 % of total Hgb (ref <5.7)
Mean Plasma Glucose: 105 mg/dL
eAG (mmol/L): 5.8 mmol/L

## 2021-09-27 LAB — TSH: TSH: 1.43 mIU/L

## 2021-09-27 LAB — VITAMIN D 25 HYDROXY (VIT D DEFICIENCY, FRACTURES): Vit D, 25-Hydroxy: 67 ng/mL (ref 30–100)

## 2021-09-27 LAB — RPR: RPR Ser Ql: NONREACTIVE

## 2021-10-11 ENCOUNTER — Other Ambulatory Visit: Payer: Self-pay | Admitting: Family Medicine

## 2021-10-11 ENCOUNTER — Encounter: Payer: Self-pay | Admitting: Family Medicine

## 2021-10-11 MED ORDER — METRONIDAZOLE 500 MG PO TABS
500.0000 mg | ORAL_TABLET | Freq: Two times a day (BID) | ORAL | 0 refills | Status: AC
Start: 2021-10-11 — End: 2021-10-18

## 2021-10-26 ENCOUNTER — Other Ambulatory Visit: Payer: Self-pay | Admitting: Family Medicine

## 2021-10-26 DIAGNOSIS — I10 Essential (primary) hypertension: Secondary | ICD-10-CM

## 2021-11-29 ENCOUNTER — Encounter: Payer: Self-pay | Admitting: Family Medicine

## 2021-11-29 ENCOUNTER — Ambulatory Visit: Payer: Managed Care, Other (non HMO) | Admitting: Family Medicine

## 2021-11-29 ENCOUNTER — Ambulatory Visit (INDEPENDENT_AMBULATORY_CARE_PROVIDER_SITE_OTHER): Payer: Managed Care, Other (non HMO) | Admitting: Family Medicine

## 2021-11-29 VITALS — BP 124/82 | HR 94 | Temp 98.3°F | Resp 16 | Ht 60.0 in | Wt 188.6 lb

## 2021-11-29 DIAGNOSIS — Z0289 Encounter for other administrative examinations: Secondary | ICD-10-CM | POA: Diagnosis not present

## 2021-11-29 LAB — POCT URINALYSIS DIPSTICK
Bilirubin, UA: NEGATIVE
Blood, UA: NEGATIVE
Glucose, UA: NEGATIVE
Ketones, UA: NEGATIVE
Leukocytes, UA: NEGATIVE
Nitrite, UA: NEGATIVE
Odor: NORMAL
Protein, UA: NEGATIVE
Spec Grav, UA: 1.025 (ref 1.010–1.025)
Urobilinogen, UA: 0.2 E.U./dL
pH, UA: 5 (ref 5.0–8.0)

## 2021-11-29 NOTE — Progress Notes (Signed)
Patient ID: Anita Newton, female    DOB: July 28, 1980, 41 y.o.   MRN: [id number]  PCP: Steele Sizer, MD  Chief Complaint  Patient presents with   Completion paperwork    Subjective:   Anita Newton is a 41 y.o. 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  Patient Active Problem List   Diagnosis Date Noted   Genetic testing 10/26/2020   Family history unknown 10/09/2020   Ovarian cyst 07/23/2020   S/P laparoscopic hysterectomy 11/15/2019   Menorrhagia with regular cycle    Peroneal tendinitis 10/31/2019   Synovitis and tenosynovitis 10/31/2019   Morbid obesity ([place]) 04/16/2018   B12 deficiency 07/08/2016   History of iron deficiency anemia 07/08/2016   Seasonal allergic rhinitis 05/16/2015   History of eclampsia 12/15/2014   Hypertension, benign 12/15/2014   Large breasts 12/15/2014   Obesity (BMI 35.0-39.9 without comorbidity) 12/15/2014   Thoracic back pain 12/15/2014   Vitamin D deficiency 12/15/2014      Current Outpatient Medications:    ascorbic acid (VITAMIN C) 500 MG tablet, Take 500 mg by mouth daily., Disp: , Rfl:    Cyanocobalamin (B-12) 1000 MCG SUBL, Place 1 tablet under the tongue daily. (Patient taking differently: Place 1,000 mcg under the tongue daily.), Disp: 100 tablet, Rfl: 1   docusate sodium (COLACE) 100 MG capsule, TAKE 1 CAPSULE (100 MG TOTAL) BY MOUTH 2 (TWO) TIMES DAILY AS NEEDED FOR MILD CONSTIPATION., Disp: 30 capsule, Rfl: 0    ibuprofen (ADVIL,MOTRIN) 600 MG tablet, Take 1 tablet (600 mg total) by mouth every 6 (six) hours as needed for cramping. (Patient taking differently: Take 1,200 mg by mouth every 6 (six) hours as needed (chronic back pain).), Disp: 60 tablet, Rfl: 0   loratadine (CLARITIN) 10 MG tablet, Take 1 tablet (10 mg total) by mouth daily as needed for allergies., Disp: 30 tablet, Rfl: 11   montelukast (SINGULAIR) 10 MG tablet, Take 1 tablet (10 mg total) by mouth at bedtime. (Patient taking differently: Take 10 mg by mouth at bedtime as needed (allergies.).), Disp: 90 tablet, Rfl: 1   Multiple Vitamin (MULTIVITAMIN WITH MINERALS) TABS tablet, Take 1 tablet by mouth daily., Disp: , Rfl:    Omega-3 Fatty Acids (FISH OIL) 1000 MG CAPS, Take 1,000 mg by mouth every Sunday. , Disp: , Rfl:    simethicone (MYLICON) 80 MG chewable tablet, Chew 1 tablet (80 mg total) by mouth 4 (four) times daily., Disp: 30 tablet, Rfl: 0   valsartan-hydrochlorothiazide (DIOVAN-HCT) 160-25 MG tablet, TAKE 1 TABLET BY MOUTH EVERY DAY, Disp: 90 tablet, Rfl: 1   Vitamin D, Ergocalciferol, (DRISDOL) 1.25 MG (50000 UNIT) CAPS capsule, TAKE 1 CAPSULE (50,000 UNITS TOTAL) BY MOUTH EVERY SUNDAY., Disp: 12 capsule, Rfl: 1   Allergies  Allergen Reactions   Cherry Hives and Swelling    Fresh cherries with a stem   Morphine And Related     Pt doesn't like the way it makes her feel when coming off it. If has to have,  will use.     Social History   Tobacco Use   Smoking status: Never   Smokeless tobacco: Never  Vaping Use   Vaping Use: Never used  Substance Use Topics   Alcohol use: Yes    Alcohol/week: 0.0 standard drinks of alcohol    Comment: socially   Drug use: No      Chart Review Today: I personally reviewed active problem list, medication list, allergies, family history, social history, health maintenance, notes from last encounter, lab results, imaging with the patient/caregiver today.   Review of Systems   Constitutional: Negative.   HENT: Negative.    Eyes: Negative.   Respiratory: Negative.    Cardiovascular: Negative.   Gastrointestinal: Negative.   Endocrine: Negative.   Genitourinary: Negative.   Musculoskeletal: Negative.   Skin: Negative.   Allergic/Immunologic: Negative.   Neurological: Negative.   Hematological: Negative.   Psychiatric/Behavioral: Negative.    All other systems reviewed and are negative.      Objective:   Vitals:   11/29/21 1547  BP: 124/82  Pulse: 94  Resp: 16  Temp: 98.3 F (36.8 C)  TempSrc: Oral  SpO2: 99%  Weight: 188 lb 9.6 oz (85.5 kg)  Height: 5' (1.524 m)    Body mass index is 36.83 kg/m.  Physical Exam Vitals and nursing note reviewed.  Constitutional:      General: She is not in acute distress.    Appearance: Normal appearance. She is well-developed. She is obese. She is not ill-appearing, toxic-appearing or diaphoretic.  HENT:     Head: Normocephalic and atraumatic.     Right Ear: External ear normal.     Left Ear: External ear normal.     Nose: Nose normal. No congestion or rhinorrhea.     Mouth/Throat:     Mouth: Mucous membranes are moist.     Pharynx: Oropharynx is clear. Uvula midline. No oropharyngeal exudate or posterior oropharyngeal erythema.  Eyes:     General: Lids are normal.        Right eye: No discharge.        Left eye: No discharge.     Conjunctiva/sclera: Conjunctivae normal.  Neck:     Trachea: Phonation normal. No tracheal deviation.  Cardiovascular:     Rate and Rhythm: Normal rate and regular rhythm.     Pulses: Normal pulses.          Radial pulses are 2+ on the right side and 2+ on the left side.       Posterior tibial pulses are 2+ on the right side and 2+ on the left side.     Heart sounds: Normal heart sounds. No murmur heard.    No friction rub. No gallop.  Pulmonary:     Effort: Pulmonary effort is normal. No respiratory distress.     Breath sounds: Normal breath sounds. No stridor. No  wheezing, rhonchi or rales.  Chest:     Chest wall: No tenderness.  Abdominal:     General: Bowel sounds are normal. There is no distension.     Palpations: Abdomen is soft.     Tenderness: There is no abdominal tenderness. There is no guarding or rebound.  Musculoskeletal:        General: Normal range of motion.     Cervical back: Normal range of motion and neck supple.     Right lower leg: No edema.     Left lower leg: No edema.  Lymphadenopathy:  Cervical: No cervical adenopathy.  Skin:    General: Skin is warm and dry.     Capillary Refill: Capillary refill takes less than 2 seconds.     Coloration: Skin is not pale.     Findings: No rash.  Neurological:     Mental Status: She is alert.     Motor: No abnormal muscle tone.     Gait: Gait normal.  Psychiatric:        Mood and Affect: Mood normal.        Speech: Speech normal.        Behavior: Behavior normal.      Results for orders placed or performed in visit on 09/26/21  Lipid panel  Result Value Ref Range   Cholesterol 167 <200 mg/dL   HDL 49 (L) > OR = 50 mg/dL   Triglycerides 69 <150 mg/dL   LDL Cholesterol (Calc) 102 (H) mg/dL (calc)   Total CHOL/HDL Ratio 3.4 <5.0 (calc)   Non-HDL Cholesterol (Calc) 118 <130 mg/dL (calc)  CBC with Differential/Platelet  Result Value Ref Range   WBC 5.0 3.8 - 10.8 Thousand/uL   RBC 4.06 3.80 - 5.10 Million/uL   Hemoglobin 12.0 11.7 - 15.5 g/dL   HCT 35.4 35.0 - 45.0 %   MCV 87.2 80.0 - 100.0 fL   MCH 29.6 27.0 - 33.0 pg   MCHC 33.9 32.0 - 36.0 g/dL   RDW 11.9 11.0 - 15.0 %   Platelets 324 140 - 400 Thousand/uL   MPV 10.4 7.5 - 12.5 fL   Neutro Abs 2,095 1,500 - 7,800 cells/uL   Lymphs Abs 2,340 850 - 3,900 cells/uL   Absolute Monocytes 405 200 - 950 cells/uL   Eosinophils Absolute 100 15 - 500 cells/uL   Basophils Absolute 60 0 - 200 cells/uL   Neutrophils Relative % 41.9 %   Total Lymphocyte 46.8 %   Monocytes Relative 8.1 %   Eosinophils Relative 2.0 %    Basophils Relative 1.2 %  COMPLETE METABOLIC PANEL WITH GFR  Result Value Ref Range   Glucose, Bld 86 65 - 99 mg/dL   BUN 14 7 - 25 mg/dL   Creat 0.84 0.50 - 0.99 mg/dL   eGFR 89 > OR = 60 mL/min/1.10m   BUN/Creatinine Ratio SEE NOTE: 6 - 22 (calc)   Sodium 140 135 - 146 mmol/L   Potassium 3.7 3.5 - 5.3 mmol/L   Chloride 101 98 - 110 mmol/L   CO2 29 20 - 32 mmol/L   Calcium 9.4 8.6 - 10.2 mg/dL   Total Protein 7.5 6.1 - 8.1 g/dL   Albumin 4.5 3.6 - 5.1 g/dL   Globulin 3.0 1.9 - 3.7 g/dL (calc)   AG Ratio 1.5 1.0 - 2.5 (calc)   Total Bilirubin 0.7 0.2 - 1.2 mg/dL   Alkaline phosphatase (APISO) 69 31 - 125 U/L   AST 17 10 - 30 U/L   ALT 18 6 - 29 U/L  Vitamin B12  Result Value Ref Range   Vitamin B-12 501 200 - 1,100 pg/mL  VITAMIN D 25 Hydroxy (Vit-D Deficiency, Fractures)  Result Value Ref Range   Vit D, 25-Hydroxy 67 30 - 100 ng/mL  Hemoglobin A1c  Result Value Ref Range   Hgb A1c MFr Bld 5.3 <5.7 % of total Hgb   Mean Plasma Glucose 105 mg/dL   eAG (mmol/L) 5.8 mmol/L  HIV Antibody (routine testing w rflx)  Result Value Ref Range   HIV 1&2 Ab, 4th Generation NON-REACTIVE  NON-REACTIVE  RPR  Result Value Ref Range   RPR Ser Ql NON-REACTIVE NON-REACTIVE  TSH  Result Value Ref Range   TSH 1.43 mIU/L  Cervicovaginal ancillary only  Result Value Ref Range   Neisseria Gonorrhea Negative    Chlamydia Negative    Trichomonas Negative    Bacterial Vaginitis (gardnerella) Positive (A)    Candida Vaginitis Negative    Candida Glabrata Negative    Comment      Normal Reference Range Bacterial Vaginosis - Negative   Comment Normal Reference Ranger Chlamydia - Negative    Comment      Normal Reference Range Neisseria Gonorrhea - Negative   Comment Normal Reference Range Candida Species - Negative    Comment Normal Reference Range Candida Galbrata - Negative    Comment Normal Reference Range Trichomonas - Negative        Assessment & Plan:     ICD-10-CM   1.  Encounter for physical examination related to employment  Z02.89 Hearing screening    QuantiFERON-TB Gold Plus    POCT urinalysis dipstick    2. Encounter for completion of form with patient  Z02.89 QuantiFERON-TB Gold Plus    POCT urinalysis dipstick     More than 100 pages of her forms, manual, exams, med hx and other med records brought in to review in addition to time spent to complete forms, chart review in this EMR more than 10 min today, additional 5 min to complete charting. Level 5 time based visit Forms to be completed and scanned May require additional testing or immunization recommended UDS, hepatitis immunity, TB screening,etc all noted in requirements of manual prior to signing forms.   Delsa Grana, PA-C 11/29/21 4:09 PM   Time spent with patient, completing this and forms was from 1540 through 1705 today.

## 2021-12-05 ENCOUNTER — Telehealth: Payer: Self-pay | Admitting: Family Medicine

## 2021-12-05 NOTE — Telephone Encounter (Signed)
Copied from [place] [id number]. Topic: General - Other >> Dec 05, 2021  3:38 PM Sabas Sous wrote: Reason for CRM: Pt called for status update on paperwork that she brought in on the 20th. Wants to know if this is ready for her, says deadline is tomorrow.   Best contact: [phone]

## 2021-12-09 NOTE — Telephone Encounter (Signed)
Pt.notified

## 2021-12-09 NOTE — Telephone Encounter (Signed)
Paperwork is it done?

## 2022-03-14 ENCOUNTER — Ambulatory Visit: Payer: Managed Care, Other (non HMO) | Admitting: Family Medicine

## 2022-05-03 ENCOUNTER — Other Ambulatory Visit: Payer: Self-pay | Admitting: Family Medicine

## 2022-05-03 DIAGNOSIS — I10 Essential (primary) hypertension: Secondary | ICD-10-CM

## 2022-07-21 NOTE — Progress Notes (Unsigned)
Name: Anita Newton   MRN: [id number]    DOB: 11/26/1980   Date:07/22/2022       Progress Note  Subjective  Chief Complaint  Follow Up  HPI  B12 deficiency: she is taking B12   Vitamin D deficiency: she ran out of vitamin D rx, advised to switch to oral vitamin D otc 2000 units   S/p hysterectomy 11/2019 and since that time continues to RLQ pain and discomfort when she voids, no blood in urine, she developed N/V and had a complex ovarian cyst on the right side had laparoscopic surgery and lysis of adhesions. She went back to see Dr. Jean Rosenthal 09/22 and repeat Pelvic US showed resolution of complex ovarian cyst. Discussed referral to Urologist since she seems to have pain when voiding. She still has intermittent right lower quadrant pain described as a pulling sensation. 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  Denies chest pain or palpitation. No side effects . BP is normal without medication.   Large breast/Thoracic back pain: we have referred her to PT and has been physically active but continues to take ibuprofen daily to control her upper back pain. She has been physically active, she lost weight but still has large breast that is pulling down - pendulous breast   Obesity: she lost weight with increase in physical activity.   Dysthymia: she worries about her middle son that is waiting to go on trial. He has been in jail in a county jail for 4 years an dis going on trial . Oldest daughter smokes weed and is unable to have a regular job - not thinking about her future She does not want medication . She is getting ready to work night shift and has difficulty falling and staying asleep   Patient Active Problem List   Diagnosis Date Noted   Genetic testing 10/26/2020   Family history unknown 10/09/2020   Ovarian cyst 07/23/2020   S/P laparoscopic hysterectomy 11/15/2019    Menorrhagia with regular cycle    Peroneal tendinitis 10/31/2019   Synovitis and tenosynovitis 10/31/2019   Morbid obesity ([facility]) 04/16/2018   B12 deficiency 07/08/2016   History of iron deficiency anemia 07/08/2016   Seasonal allergic rhinitis 05/16/2015   History of eclampsia 12/15/2014   Hypertension, benign 12/15/2014   Large breasts 12/15/2014   Obesity (BMI 35.0-39.9 without comorbidity) 12/15/2014   Thoracic back pain 12/15/2014   Vitamin D deficiency 12/15/2014    Past Surgical History:  Procedure Laterality Date   CYSTOSCOPY N/A 11/15/2019   Procedure: CYSTOSCOPY;  Surgeon: Natale Milch, MD;  Location: [facility];  Service: Gynecology;  Laterality: N/A;   DILATATION & CURETTAGE/HYSTEROSCOPY WITH MYOSURE N/A 10/06/2017   Procedure: DILATATION & CURETTAGE /HYSTEROSCOPY;  Surgeon: Natale Milch, MD;  Location: [facility];  Service: Gynecology;  Laterality: N/A;   EYE SURGERY Right 2004   eyelid laceration    LAPAROSCOPIC OVARIAN CYSTECTOMY N/A 07/24/2020   Procedure: Diagnostic laparoscopy, lysis of adhesions;  Surgeon: Conard Novak, MD;  Location: [facility];  Service: Gynecology;  Laterality: N/A;   TOTAL LAPAROSCOPIC HYSTERECTOMY WITH SALPINGECTOMY Bilateral 11/15/2019   Procedure: TOTAL LAPAROSCOPIC HYSTERECTOMY WITH SALPINGECTOMY;  Surgeon: Natale Milch, MD;  Location: [facility];  Service: Gynecology;  Laterality: Bilateral;   TUBAL LIGATION      Family History  Adopted: Yes  Problem Relation Age of Onset  Cancer Mother        eye tumor   Diabetes Mother    Lupus Mother    Alcohol abuse Father    Drug abuse Father    Lupus Sister    Depression Sister    Hypertension Brother    Eczema Daughter    ADD / ADHD Daughter    ADD / ADHD Son    Allergic Disorder Son    Breast cancer Neg Hx     Social History   Tobacco Use   Smoking status: Never   Smokeless tobacco: Never  Substance Use Topics   Alcohol use: Yes    Alcohol/week: 0.0  standard drinks of alcohol    Comment: socially     Current Outpatient Medications:    ascorbic acid (VITAMIN C) 500 MG tablet, Take 500 mg by mouth daily., Disp: , Rfl:    Cyanocobalamin (B-12) 1000 MCG SUBL, Place 1 tablet under the tongue daily., Disp: 100 tablet, Rfl: 1   docusate sodium (COLACE) 100 MG capsule, TAKE 1 CAPSULE (100 MG TOTAL) BY MOUTH 2 (TWO) TIMES DAILY AS NEEDED FOR MILD CONSTIPATION., Disp: 30 capsule, Rfl: 0   ibuprofen (ADVIL,MOTRIN) 600 MG tablet, Take 1 tablet (600 mg total) by mouth every 6 (six) hours as needed for cramping. (Patient taking differently: Take 1,200 mg by mouth every 6 (six) hours as needed (chronic back pain).), Disp: 60 tablet, Rfl: 0   loratadine (CLARITIN) 10 MG tablet, Take 1 tablet (10 mg total) by mouth daily as needed for allergies., Disp: 30 tablet, Rfl: 11   montelukast (SINGULAIR) 10 MG tablet, Take 1 tablet (10 mg total) by mouth at bedtime. (Patient taking differently: Take 10 mg by mouth at bedtime as needed (allergies.).), Disp: 90 tablet, Rfl: 1   Multiple Vitamin (MULTIVITAMIN WITH MINERALS) TABS tablet, Take 1 tablet by mouth daily., Disp: , Rfl:    Omega-3 Fatty Acids (FISH OIL) 1000 MG CAPS, Take 1,000 mg by mouth every Sunday. , Disp: , Rfl:    simethicone (MYLICON) 80 MG chewable tablet, Chew 1 tablet (80 mg total) by mouth 4 (four) times daily., Disp: 30 tablet, Rfl: 0   Vitamin D, Ergocalciferol, (DRISDOL) 1.25 MG (50000 UNIT) CAPS capsule, TAKE 1 CAPSULE (50,000 UNITS TOTAL) BY MOUTH EVERY SUNDAY., Disp: 12 capsule, Rfl: 1   valsartan-hydrochlorothiazide (DIOVAN-HCT) 160-25 MG tablet, TAKE 1 TABLET BY MOUTH EVERY DAY (Patient not taking: Reported on 07/22/2022), Disp: 90 tablet, Rfl: 0  Allergies  Allergen Reactions   Cherry Hives and Swelling    Fresh cherries with a stem   Morphine And Codeine     Pt doesn't like the way it makes her feel when coming off it. If has to have, will use.    I personally reviewed active  problem list, medication list, allergies, family history, social history, health maintenance with the patient/caregiver today.   ROS  Ten systems reviewed and is negative except as mentioned in HPI   Objective  Vitals:   07/22/22 1433  BP: 118/80  Pulse: 88  Resp: 16  SpO2: 99%  Weight: 193 lb (87.5 kg)  Height: 5' (1.524 m)    Body mass index is 37.69 kg/m.  Physical Exam  Constitutional: Patient appears well-developed and well-nourished. Obese  No distress.  HEENT: head atraumatic, normocephalic, pupils equal and reactive to light, neck supple Cardiovascular: Normal rate, regular rhythm and normal heart sounds.  No murmur heard. No BLE edema. Pulmonary/Chest: Effort normal and breath sounds normal. No respiratory  distress. Abdominal: Soft.  There is no tenderness. Psychiatric: Patient has a normal mood and affect. behavior is normal. Judgment and thought content normal.   PHQ2/9:    07/22/2022    2:32 PM 11/29/2021    3:46 PM 09/26/2021    9:16 AM 01/23/2021    2:10 PM 09/21/2020    1:51 PM  Depression screen PHQ 2/9  Decreased Interest 1 0 1 1 1   Down, Depressed, Hopeless 1 0 0 1 2  PHQ - 2 Score 2 0 1 2 3   Altered sleeping 3 0 3 3 3   Tired, decreased energy 3 0 1 2 3   Change in appetite 0 0 0 0 0  Feeling bad or failure about yourself  1 0 0 0 1  Trouble concentrating 0 0 0 0 3  Moving slowly or fidgety/restless 0 0 0 1 0  Suicidal thoughts 0 0 0 0 0  PHQ-9 Score 9 0 5 8 13   Difficult doing work/chores  Not difficult at all Not difficult at all Somewhat difficult Somewhat difficult    phq 9 is positive   Fall Risk:    07/22/2022    2:32 PM 11/29/2021    3:46 PM 09/26/2021    9:16 AM 01/23/2021    1:59 PM 09/21/2020    1:51 PM  Fall Risk   Falls in the past year? 0 0 0 0 0  Number falls in past yr: 0 0 0  0  Injury with Fall? 0 0 0  0  Risk for fall due to : No Fall Risks No Fall Risks   No Fall Risks  Follow up Falls prevention discussed Falls  prevention discussed;Education provided  Falls prevention discussed Falls prevention discussed      Functional Status Survey: Is the patient deaf or have difficulty hearing?: No Does the patient have difficulty seeing, even when wearing glasses/contacts?: No Does the patient have difficulty concentrating, remembering, or making decisions?: No Does the patient have difficulty walking or climbing stairs?: No Does the patient have difficulty dressing or bathing?: No Does the patient have difficulty doing errands alone such as visiting a doctor's office or shopping?: No    Assessment & Plan  1. Obesity (BMI 30-39.9)  Discussed with the patient the risk posed by an increased BMI. Discussed importance of portion control, calorie counting and at least 150 minutes of physical activity weekly. Avoid sweet beverages and drink more water. Eat at least 6 servings of fruit and vegetables daily    2. Vaginal discharge  - Cervicovaginal ancillary only  3. History of hypertension  Off medication  4. Need for Tdap vaccination  - Tdap vaccine greater than or equal to 7yo IM  5. Screening examination for STI  - Cervicovaginal ancillary only - HIV antibody (with reflex) - RPR  6. Dysthymia   She does not want to take medication   7. Insomnia, unspecified type  - traZODone (DESYREL) 50 MG tablet; Take 0.5-1 tablets (25-50 mg total) by mouth at bedtime as needed for sleep.  Dispense: 90 tablet; Refill: 1

## 2022-07-22 ENCOUNTER — Encounter: Payer: Self-pay | Admitting: Family Medicine

## 2022-07-22 ENCOUNTER — Ambulatory Visit: Payer: Managed Care, Other (non HMO) | Admitting: Family Medicine

## 2022-07-22 ENCOUNTER — Other Ambulatory Visit (HOSPITAL_COMMUNITY)
Admission: RE | Admit: 2022-07-22 | Discharge: 2022-07-22 | Disposition: A | Discharge status: Home / Self Care | Payer: Managed Care, Other (non HMO) | Source: Ambulatory Visit | Attending: Family Medicine | Admitting: Family Medicine

## 2022-07-22 VITALS — BP 118/80 | HR 88 | Resp 16 | Ht 60.0 in | Wt 193.0 lb

## 2022-07-22 DIAGNOSIS — Z23 Encounter for immunization: Secondary | ICD-10-CM

## 2022-07-22 DIAGNOSIS — Z113 Encounter for screening for infections with a predominantly sexual mode of transmission: Secondary | ICD-10-CM | POA: Diagnosis not present

## 2022-07-22 DIAGNOSIS — I5030 Unspecified diastolic (congestive) heart failure: Secondary | ICD-10-CM | POA: Insufficient documentation

## 2022-07-22 DIAGNOSIS — N898 Other specified noninflammatory disorders of vagina: Secondary | ICD-10-CM | POA: Insufficient documentation

## 2022-07-22 DIAGNOSIS — Z8679 Personal history of other diseases of the circulatory system: Secondary | ICD-10-CM

## 2022-07-22 DIAGNOSIS — F341 Dysthymic disorder: Secondary | ICD-10-CM | POA: Insufficient documentation

## 2022-07-22 DIAGNOSIS — T632X1A Toxic effect of venom of scorpion, accidental (unintentional), initial encounter: Secondary | ICD-10-CM | POA: Insufficient documentation

## 2022-07-22 DIAGNOSIS — G4700 Insomnia, unspecified: Secondary | ICD-10-CM

## 2022-07-22 DIAGNOSIS — E669 Obesity, unspecified: Secondary | ICD-10-CM

## 2022-07-22 MED ORDER — TRAZODONE HCL 50 MG PO TABS: 25.0000 mg | ORAL_TABLET | Freq: Every evening | ORAL | 1 refills | Status: DC | PRN

## 2022-07-23 LAB — HIV ANTIBODY (ROUTINE TESTING W REFLEX): HIV 1&2 Ab, 4th Generation: NONREACTIVE

## 2022-07-23 LAB — RPR: RPR Ser Ql: NONREACTIVE

## 2022-07-24 ENCOUNTER — Other Ambulatory Visit: Payer: Self-pay | Admitting: Family Medicine

## 2022-07-24 ENCOUNTER — Encounter: Payer: Self-pay | Admitting: Family Medicine

## 2022-07-24 DIAGNOSIS — B9689 Other specified bacterial agents as the cause of diseases classified elsewhere: Secondary | ICD-10-CM

## 2022-07-24 LAB — CERVICOVAGINAL ANCILLARY ONLY
Bacterial Vaginitis (gardnerella): POSITIVE — AB
Candida Glabrata: NEGATIVE
Candida Vaginitis: NEGATIVE
Chlamydia: NEGATIVE
Comment: NEGATIVE
Comment: NEGATIVE
Comment: NEGATIVE
Comment: NEGATIVE
Comment: NEGATIVE
Comment: NORMAL
Neisseria Gonorrhea: NEGATIVE
Trichomonas: NEGATIVE

## 2022-07-24 MED ORDER — METRONIDAZOLE 500 MG PO TABS
500.0000 mg | ORAL_TABLET | Freq: Two times a day (BID) | ORAL | 0 refills | Status: AC
Start: 2022-07-24 — End: 2022-07-31

## 2022-08-08 ENCOUNTER — Other Ambulatory Visit: Payer: Self-pay | Admitting: Family Medicine

## 2022-08-08 DIAGNOSIS — I10 Essential (primary) hypertension: Secondary | ICD-10-CM

## 2022-10-06 NOTE — Progress Notes (Unsigned)
Name: Anita Newton   MRN: [id number]    DOB: [birth year]   Date:10/07/2022       Progress Note  Subjective  Chief Complaint  CPE  HPI  Patient presents for annual CPE.  Diet: tries to eat healthy  Exercise: continue regular physical activity   Last Eye Exam: up to date Last Dental Exam: up to date  Flowsheet Row Office Visit from 01/23/2021 in [facility]  AUDIT-C Score 1      Depression: Phq 9 is  positive    10/07/2022   12:22 PM 07/22/2022    2:32 PM 11/29/2021    3:46 PM 09/26/2021    9:16 AM 01/23/2021    2:10 PM  Depression screen PHQ 2/9  Decreased Interest 1 1 0 1 1  Down, Depressed, Hopeless 1 1 0 0 1  PHQ - 2 Score 2 2 0 1 2  Altered sleeping 1 3 0 3 3  Tired, decreased energy 2 3 0 1 2  Change in appetite 0 0 0 0 0  Feeling bad or failure about yourself  0 1 0 0 0  Trouble concentrating 1 0 0 0 0  Moving slowly or fidgety/restless 0 0 0 0 1  Suicidal thoughts 0 0 0 0 0  PHQ-9 Score 6 9 0 5 8  Difficult doing work/chores Not difficult at all  Not difficult at all Not difficult at all Somewhat difficult   Hypertension: BP Readings from Last 3 Encounters:  10/07/22 118/76  07/22/22 118/80  11/29/21 124/82   Obesity: Wt Readings from Last 3 Encounters:  10/07/22 190 lb 11.2 oz (86.5 kg)  07/22/22 193 lb (87.5 kg)  11/29/21 188 lb 9.6 oz (85.5 kg)   BMI Readings from Last 3 Encounters:  10/07/22 36.03 kg/m  07/22/22 37.69 kg/m  11/29/21 36.83 kg/m     Vaccines:   HPV: Aged Out Tdap: 07/22/2022 Shingrix: N/A Pneumonia: N/A Flu: 11/29/2021 COVID-19:Completed 3 doses   Hep C Screening: 07/22/2022 STD testing and prevention (HIV/chl/gon/syphilis): recently done  Intimate partner violence: negative screen  Sexual History : no pain or discomfort  Menstrual History/LMP/Abnormal Bleeding: s/p hysterectomy  Discussed importance of follow up if any post-menopausal bleeding: not applicable  Incontinence Symptoms:  mild symptoms but positive for symptoms - kegel exercises   Breast cancer:  - Last Mammogram: 02/15/2021 - BRCA gene screening: she had genetic testing since she does not know her history   Osteoporosis Prevention : Discussed high calcium and vitamin D supplementation, weight bearing exercises Bone density :not applicable   Cervical cancer screening: Hysterectomy  Skin cancer: Discussed monitoring for atypical lesions  Colorectal cancer:   N/A Lung cancer:  Low Dose CT Chest recommended if Age 52-80 years, 20 pack-year currently smoking OR have quit w/in 15years. Patient does not qualify for screen   ECG: 07/23/2020  Advanced Care Planning: A voluntary discussion about advance care planning including the explanation and discussion of advance directives.  Discussed health care proxy and Living will, and the patient was able to identify a health care proxy as Dava - youngest daughter .  Patient does not have a living will and power of attorney of health care   Lipids: Lab Results  Component Value Date   CHOL 167 09/26/2021   CHOL 161 09/21/2020   CHOL 169 07/20/2019   Lab Results  Component Value Date   HDL 49 (L) 09/26/2021   HDL 53 09/21/2020   HDL 43 07/20/2019  Lab Results  Component Value Date   LDLCALC 102 (H) 09/26/2021   LDLCALC 93 09/21/2020   LDLCALC 99 07/20/2019   Lab Results  Component Value Date   TRIG 69 09/26/2021   TRIG 65 09/21/2020   TRIG 156 (H) 07/20/2019   Lab Results  Component Value Date   CHOLHDL 3.4 09/26/2021   CHOLHDL 3.0 09/21/2020   CHOLHDL 3.9 07/20/2019   No results found for: "LDLDIRECT"  Glucose: Glucose, Bld  Date Value Ref Range Status  09/26/2021 86 65 - 99 mg/dL Final    Comment:    .            Fasting reference interval .   09/21/2020 72 65 - 99 mg/dL Final    Comment:    .            Fasting reference interval .   07/25/2020 143 (H) 70 - 99 mg/dL Final    Comment:    Glucose reference range applies only to  samples taken after fasting for at least 8 hours.   Glucose-Capillary  Date Value Ref Range Status  07/24/2020 91 70 - 99 mg/dL Final    Comment:    Glucose reference range applies only to samples taken after fasting for at least 8 hours.    Patient Active Problem List   Diagnosis Date Noted   Congestive heart failure with right ventricular diastolic dysfunction (HCC) 07/22/2022   Dysthymia 07/22/2022   Genetic testing 10/26/2020   Family history unknown 10/09/2020   Ovarian cyst 07/23/2020   S/P laparoscopic hysterectomy 11/15/2019   Menorrhagia with regular cycle    Peroneal tendinitis 10/31/2019   Synovitis and tenosynovitis 10/31/2019   Morbid obesity (HCC) 04/16/2018   B12 deficiency 07/08/2016   History of iron deficiency anemia 07/08/2016   Seasonal allergic rhinitis 05/16/2015   History of eclampsia 12/15/2014   Hypertension, benign 12/15/2014   Large breasts 12/15/2014   Obesity (BMI 35.0-39.9 without comorbidity) 12/15/2014   Thoracic back pain 12/15/2014   Vitamin D deficiency 12/15/2014    Past Surgical History:  Procedure Laterality Date   CYSTOSCOPY N/A 11/15/2019   Procedure: CYSTOSCOPY;  Surgeon: Natale Milch, MD;  Location: [facility];  Service: Gynecology;  Laterality: N/A;   DILATATION & CURETTAGE/HYSTEROSCOPY WITH MYOSURE N/A 10/06/2017   Procedure: DILATATION & CURETTAGE /HYSTEROSCOPY;  Surgeon: Natale Milch, MD;  Location: [facility];  Service: Gynecology;  Laterality: N/A;   EYE SURGERY Right 2004   eyelid laceration    LAPAROSCOPIC OVARIAN CYSTECTOMY N/A 07/24/2020   Procedure: Diagnostic laparoscopy, lysis of adhesions;  Surgeon: Conard Novak, MD;  Location: [facility];  Service: Gynecology;  Laterality: N/A;   TOTAL LAPAROSCOPIC HYSTERECTOMY WITH SALPINGECTOMY Bilateral 11/15/2019   Procedure: TOTAL LAPAROSCOPIC HYSTERECTOMY WITH SALPINGECTOMY;  Surgeon: Natale Milch, MD;  Location: [facility];  Service: Gynecology;   Laterality: Bilateral;   TUBAL LIGATION      Family History  Adopted: Yes  Problem Relation Age of Onset   Cancer Mother        eye tumor   Diabetes Mother    Lupus Mother    Alcohol abuse Father    Drug abuse Father    Lupus Sister    Depression Sister    Hypertension Brother    Eczema Daughter    ADD / ADHD Daughter    ADD / ADHD Son    Allergic Disorder Son    Breast cancer Neg Hx     Social History  Socioeconomic History   Marital status: Single    Spouse name: Not on file   Number of children: 3   Years of education: Not on file   Highest education level: Some college, no degree  Occupational History   Not on file  Tobacco Use   Smoking status: Never   Smokeless tobacco: Never  Vaping Use   Vaping status: Never Used  Substance and Sexual Activity   Alcohol use: Yes    Alcohol/week: 0.0 standard drinks of alcohol    Comment: socially   Drug use: No   Sexual activity: Yes    Birth control/protection: Condom, Injection, Surgical    Comment: Tubal Ligation   Other Topics Concern   Not on file  Social History Narrative   Not on file   Social Determinants of Health   Financial Resource Strain: Low Risk  (10/07/2022)   Overall Financial Resource Strain (CARDIA)    Difficulty of Paying Living Expenses: Not hard at all  Food Insecurity: No Food Insecurity (10/07/2022)   Hunger Vital Sign    Worried About Running Out of Food in the Last Year: Never true    Ran Out of Food in the Last Year: Never true  Transportation Needs: No Transportation Needs (10/07/2022)   PRAPARE - Administrator, Civil Service (Medical): No    Lack of Transportation (Non-Medical): No  Physical Activity: Sufficiently Active (10/07/2022)   Exercise Vital Sign    Days of Exercise per Week: 4 days    Minutes of Exercise per Session: 50 min  Stress: Stress Concern Present (10/07/2022)   [employer] of Occupational Health - Occupational Stress Questionnaire    Feeling of  Stress : To some extent  Social Connections: Unknown (10/07/2022)   Social Connection and Isolation Panel [NHANES]    Frequency of Communication with Friends and Family: More than three times a week    Frequency of Social Gatherings with Friends and Family: Once a week    Attends Religious Services: Patient declined    Database administrator or Organizations: No    Attends Banker Meetings: Never    Marital Status: Divorced  Catering manager Violence: Not At Risk (10/07/2022)   Humiliation, Afraid, Rape, and Kick questionnaire    Fear of Current or Ex-Partner: No    Emotionally Abused: No    Physically Abused: No    Sexually Abused: No     Current Outpatient Medications:    ascorbic acid (VITAMIN C) 500 MG tablet, Take 500 mg by mouth daily., Disp: , Rfl:    Cholecalciferol (VITAMIN D) 50 MCG (2000 UT) CAPS, Take 1 capsule by mouth daily at 12 noon., Disp: , Rfl:    Cyanocobalamin (B-12) 1000 MCG SUBL, Place 1 tablet under the tongue daily., Disp: 100 tablet, Rfl: 1   docusate sodium (COLACE) 100 MG capsule, TAKE 1 CAPSULE (100 MG TOTAL) BY MOUTH 2 (TWO) TIMES DAILY AS NEEDED FOR MILD CONSTIPATION., Disp: 30 capsule, Rfl: 0   ibuprofen (ADVIL,MOTRIN) 600 MG tablet, Take 1 tablet (600 mg total) by mouth every 6 (six) hours as needed for cramping. (Patient taking differently: Take 1,200 mg by mouth every 6 (six) hours as needed (chronic back pain).), Disp: 60 tablet, Rfl: 0   loratadine (CLARITIN) 10 MG tablet, Take 1 tablet (10 mg total) by mouth daily as needed for allergies., Disp: 30 tablet, Rfl: 11   montelukast (SINGULAIR) 10 MG tablet, Take 1 tablet (10 mg total) by mouth at  bedtime. (Patient taking differently: Take 10 mg by mouth at bedtime as needed (allergies.).), Disp: 90 tablet, Rfl: 1   Multiple Vitamin (MULTIVITAMIN WITH MINERALS) TABS tablet, Take 1 tablet by mouth daily., Disp: , Rfl:    Omega-3 Fatty Acids (FISH OIL) 1000 MG CAPS, Take 1,000 mg by mouth every  Sunday. , Disp: , Rfl:    simethicone (MYLICON) 80 MG chewable tablet, Chew 1 tablet (80 mg total) by mouth 4 (four) times daily., Disp: 30 tablet, Rfl: 0   traZODone (DESYREL) 50 MG tablet, Take 0.5-1 tablets (25-50 mg total) by mouth at bedtime as needed for sleep., Disp: 90 tablet, Rfl: 1  Allergies  Allergen Reactions   Cherry Hives and Swelling    Fresh cherries with a stem   Morphine And Codeine     Pt doesn't like the way it makes her feel when coming off it. If has to have, will use.     ROS  Constitutional: Negative for fever or weight change.  Respiratory: Negative for cough and shortness of breath.   Cardiovascular: Negative for chest pain or palpitations.  Gastrointestinal: Negative for abdominal pain, no bowel changes.  Musculoskeletal: Negative for gait problem or joint swelling.  Skin: Negative for rash.  Neurological: Negative for dizziness or headache.  No other specific complaints in a complete review of systems (except as listed in HPI above).   Objective  Vitals:   10/07/22 1209  BP: 118/76  Pulse: 89  Resp: 14  Temp: 97.8 F (36.6 C)  TempSrc: Oral  SpO2: 98%  Weight: 190 lb 11.2 oz (86.5 kg)  Height: 5\' 1"  (1.549 m)    Body mass index is 36.03 kg/m.  Physical Exam  Constitutional: Patient appears well-developed and well-nourished. No distress.  HENT: Head: Normocephalic and atraumatic. Ears: B TMs ok, no erythema or effusion; Nose: Nose normal. Mouth/Throat: Oropharynx is clear and moist. No oropharyngeal exudate.  Eyes: Conjunctivae and EOM are normal. Pupils are equal, round, and reactive to light. No scleral icterus.  Neck: Normal range of motion. Neck supple. No JVD present. No thyromegaly present.  Cardiovascular: Normal rate, regular rhythm and normal heart sounds.  No murmur heard. No BLE edema. Pulmonary/Chest: Effort normal and breath sounds normal. No respiratory distress. Abdominal: Soft. Bowel sounds are normal, no distension. There  is no tenderness. no masses Breast: no lumps or masses, no nipple discharge or rashes FEMALE GENITALIA:  Not done  RECTAL: not done  Musculoskeletal: Normal range of motion, no joint effusions. No gross deformities Neurological: he is alert and oriented to person, place, and time. No cranial nerve deficit. Coordination, balance, strength, speech and gait are normal.  Skin: Skin is warm and dry. No rash noted. No erythema.  Psychiatric: Patient has a normal mood and affect. behavior is normal. Judgment and thought content normal.   Recent Results (from the past 2160 hour(s))  Cervicovaginal ancillary only     Status: Abnormal   Collection Time: 07/22/22  3:18 PM  Result Value Ref Range   Neisseria Gonorrhea Negative    Chlamydia Negative    Trichomonas Negative    Bacterial Vaginitis (gardnerella) Positive (A)    Candida Vaginitis Negative    Candida Glabrata Negative    Comment      Normal Reference Range Bacterial Vaginosis - Negative   Comment Normal Reference Range Candida Species - Negative    Comment Normal Reference Range Candida Galbrata - Negative    Comment Normal Reference Range Trichomonas - Negative  Comment Normal Reference Ranger Chlamydia - Negative    Comment      Normal Reference Range Neisseria Gonorrhea - Negative  HIV antibody (with reflex)     Status: None   Collection Time: 07/22/22  3:39 PM  Result Value Ref Range   HIV 1&2 Ab, 4th Generation NON-REACTIVE NON-REACTIVE    Comment: HIV-1 antigen and HIV-1/HIV-2 antibodies were not detected. There is no laboratory evidence of HIV infection. Marland Kitchen PLEASE NOTE: This information has been disclosed to you from records whose confidentiality may be protected by state law.  If your state requires such protection, then the state law prohibits you from making any further disclosure of the information without the specific written consent of the person to whom it pertains, or as otherwise permitted by law. A general  authorization for the release of medical or other information is NOT sufficient for this purpose. . For additional information please refer to http://education.questdiagnostics.com/faq/FAQ106 (This link is being provided for informational/ educational purposes only.) . Marland Kitchen The performance of this assay has not been clinically validated in patients less than 44 years old. .   RPR     Status: None   Collection Time: 07/22/22  3:39 PM  Result Value Ref Range   RPR Ser Ql NON-REACTIVE NON-REACTIVE    Comment: . No laboratory evidence of syphilis. If recent exposure is suspected, submit a new sample in 2-4 weeks. .      Fall Risk:    10/07/2022   12:11 PM 07/22/2022    2:32 PM 11/29/2021    3:46 PM 09/26/2021    9:16 AM 01/23/2021    1:59 PM  Fall Risk   Falls in the past year? 0 0 0 0 0  Number falls in past yr:  0 0 0   Injury with Fall?  0 0 0   Risk for fall due to : No Fall Risks No Fall Risks No Fall Risks    Follow up Falls prevention discussed;Falls evaluation completed;Education provided Falls prevention discussed Falls prevention discussed;Education provided  Falls prevention discussed     Functional Status Survey: Is the patient deaf or have difficulty hearing?: No Does the patient have difficulty seeing, even when wearing glasses/contacts?: No Does the patient have difficulty concentrating, remembering, or making decisions?: No Does the patient have difficulty walking or climbing stairs?: No Does the patient have difficulty dressing or bathing?: No Does the patient have difficulty doing errands alone such as visiting a doctor's office or shopping?: No   Assessment & Plan  1. Well adult exam  - CBC with Differential - Lipid panel - Hemoglobin A1C - B12 and Folate Panel - Nicotine/cotinine metabolites - Comprehensive metabolic panel - Nicotine screen, urine  2. B12 deficiency  Recheck level   3. Lipid screening  - Lipid panel  4. Diabetes mellitus  screening  - Hemoglobin A1C  5. Breast cancer screening by mammogram  - MM 3D SCREENING MAMMOGRAM BILATERAL BREAST; Future   -USPSTF grade A and B recommendations reviewed with patient; age-appropriate recommendations, preventive care, screening tests, etc discussed and encouraged; healthy living encouraged; see AVS for patient education given to patient -Discussed importance of 150 minutes of physical activity weekly, eat two servings of fish weekly, eat one serving of tree nuts ( cashews, pistachios, pecans, almonds.Marland Kitchen) every other day, eat 6 servings of fruit/vegetables daily and drink plenty of water and avoid sweet beverages.   -Reviewed Health Maintenance: Yes.

## 2022-10-07 ENCOUNTER — Encounter: Payer: Self-pay | Admitting: Family Medicine

## 2022-10-07 ENCOUNTER — Ambulatory Visit (INDEPENDENT_AMBULATORY_CARE_PROVIDER_SITE_OTHER): Payer: Managed Care, Other (non HMO) | Admitting: Family Medicine

## 2022-10-07 VITALS — BP 118/76 | HR 89 | Temp 97.8°F | Resp 14 | Ht 61.0 in | Wt 190.7 lb

## 2022-10-07 DIAGNOSIS — Z1231 Encounter for screening mammogram for malignant neoplasm of breast: Secondary | ICD-10-CM

## 2022-10-07 DIAGNOSIS — Z0000 Encounter for general adult medical examination without abnormal findings: Secondary | ICD-10-CM | POA: Diagnosis not present

## 2022-10-07 DIAGNOSIS — Z13220 Encounter for screening for lipoid disorders: Secondary | ICD-10-CM | POA: Diagnosis not present

## 2022-10-07 DIAGNOSIS — E538 Deficiency of other specified B group vitamins: Secondary | ICD-10-CM

## 2022-10-07 DIAGNOSIS — Z131 Encounter for screening for diabetes mellitus: Secondary | ICD-10-CM | POA: Diagnosis not present

## 2022-10-16 LAB — CBC WITH DIFFERENTIAL/PLATELET
A1c: 5.4
EGFR: 95

## 2023-01-20 ENCOUNTER — Other Ambulatory Visit: Payer: Self-pay | Admitting: Family Medicine

## 2023-01-20 DIAGNOSIS — G4700 Insomnia, unspecified: Secondary | ICD-10-CM

## 2023-01-20 DIAGNOSIS — F341 Dysthymic disorder: Secondary | ICD-10-CM

## 2023-02-19 ENCOUNTER — Ambulatory Visit
Admission: RE | Admit: 2023-02-19 | Discharge: 2023-02-19 | Disposition: A | Discharge status: Home / Self Care | Payer: Managed Care, Other (non HMO) | Source: Ambulatory Visit | Attending: Family Medicine | Admitting: Family Medicine

## 2023-02-19 DIAGNOSIS — Z1231 Encounter for screening mammogram for malignant neoplasm of breast: Secondary | ICD-10-CM | POA: Insufficient documentation

## 2023-02-20 ENCOUNTER — Other Ambulatory Visit: Payer: Self-pay | Admitting: Family Medicine

## 2023-02-20 DIAGNOSIS — R928 Other abnormal and inconclusive findings on diagnostic imaging of breast: Secondary | ICD-10-CM

## 2023-03-02 ENCOUNTER — Ambulatory Visit
Admission: RE | Admit: 2023-03-02 | Discharge: 2023-03-02 | Disposition: A | Discharge status: Home / Self Care | Payer: Managed Care, Other (non HMO) | Source: Ambulatory Visit | Attending: Family Medicine | Admitting: Family Medicine

## 2023-03-02 DIAGNOSIS — R928 Other abnormal and inconclusive findings on diagnostic imaging of breast: Secondary | ICD-10-CM

## 2023-04-19 ENCOUNTER — Other Ambulatory Visit: Payer: Self-pay | Admitting: Family Medicine

## 2023-04-19 DIAGNOSIS — F341 Dysthymic disorder: Secondary | ICD-10-CM

## 2023-04-19 DIAGNOSIS — G4700 Insomnia, unspecified: Secondary | ICD-10-CM

## 2023-10-13 ENCOUNTER — Ambulatory Visit: Admitting: Family Medicine

## 2023-10-13 ENCOUNTER — Other Ambulatory Visit (HOSPITAL_COMMUNITY)
Admission: RE | Admit: 2023-10-13 | Discharge: 2023-10-13 | Disposition: A | Discharge status: Home / Self Care | Source: Ambulatory Visit | Attending: Family Medicine | Admitting: Family Medicine

## 2023-10-13 ENCOUNTER — Encounter: Payer: Self-pay | Admitting: Family Medicine

## 2023-10-13 VITALS — BP 138/82 | HR 91 | Resp 16 | Ht 61.0 in | Wt 208.4 lb

## 2023-10-13 DIAGNOSIS — Z0001 Encounter for general adult medical examination with abnormal findings: Secondary | ICD-10-CM

## 2023-10-13 DIAGNOSIS — I10 Essential (primary) hypertension: Secondary | ICD-10-CM

## 2023-10-13 DIAGNOSIS — Z113 Encounter for screening for infections with a predominantly sexual mode of transmission: Secondary | ICD-10-CM | POA: Insufficient documentation

## 2023-10-13 DIAGNOSIS — Z0000 Encounter for general adult medical examination without abnormal findings: Secondary | ICD-10-CM

## 2023-10-13 DIAGNOSIS — Z1231 Encounter for screening mammogram for malignant neoplasm of breast: Secondary | ICD-10-CM

## 2023-10-13 DIAGNOSIS — Z23 Encounter for immunization: Secondary | ICD-10-CM

## 2023-10-13 DIAGNOSIS — E538 Deficiency of other specified B group vitamins: Secondary | ICD-10-CM

## 2023-10-13 DIAGNOSIS — Z131 Encounter for screening for diabetes mellitus: Secondary | ICD-10-CM

## 2023-10-13 DIAGNOSIS — Z13220 Encounter for screening for lipoid disorders: Secondary | ICD-10-CM

## 2023-10-13 NOTE — Patient Instructions (Signed)
 Preventive Care 58-44 Years Old, Female  Preventive care refers to lifestyle choices and visits with your health care provider that can promote health and wellness. Preventive care visits are also called wellness exams.  What can I expect for my preventive care visit?  Counseling  Your health care provider may ask you questions about your:  Medical history, including:  Past medical problems.  Family medical history.  Pregnancy history.  Current health, including:  Menstrual cycle.  Method of birth control.  Emotional well-being.  Home life and relationship well-being.  Sexual activity and sexual health.  Lifestyle, including:  Alcohol, nicotine or tobacco, and drug use.  Access to firearms.  Diet, exercise, and sleep habits.  Work and work Astronomer.  Sunscreen use.  Safety issues such as seatbelt and bike helmet use.  Physical exam  Your health care provider will check your:  Height and weight. These may be used to calculate your BMI (body mass index). BMI is a measurement that tells if you are at a healthy weight.  Waist circumference. This measures the distance around your waistline. This measurement also tells if you are at a healthy weight and may help predict your risk of certain diseases, such as type 2 diabetes and high blood pressure.  Heart rate and blood pressure.  Body temperature.  Skin for abnormal spots.  What immunizations do I need?    Vaccines are usually given at various ages, according to a schedule. Your health care provider will recommend vaccines for you based on your age, medical history, and lifestyle or other factors, such as travel or where you work.  What tests do I need?  Screening  Your health care provider may recommend screening tests for certain conditions. This may include:  Lipid and cholesterol levels.  Diabetes screening. This is done by checking your blood sugar (glucose) after you have not eaten for a while (fasting).  Pelvic exam and Pap test.  Hepatitis B test.  Hepatitis C  test.  HIV (human immunodeficiency virus) test.  STI (sexually transmitted infection) testing, if you are at risk.  Lung cancer screening.  Colorectal cancer screening.  Mammogram. Talk with your health care provider about when you should start having regular mammograms. This may depend on whether you have a family history of breast cancer.  BRCA-related cancer screening. This may be done if you have a family history of breast, ovarian, tubal, or peritoneal cancers.  Bone density scan. This is done to screen for osteoporosis.  Talk with your health care provider about your test results, treatment options, and if necessary, the need for more tests.  Follow these instructions at home:  Eating and drinking    Eat a diet that includes fresh fruits and vegetables, whole grains, lean protein, and low-fat dairy products.  Take vitamin and mineral supplements as recommended by your health care provider.  Do not drink alcohol if:  Your health care provider tells you not to drink.  You are pregnant, may be pregnant, or are planning to become pregnant.  If you drink alcohol:  Limit how much you have to 0-1 drink a day.  Know how much alcohol is in your drink. In the [place] one drink equals one 12 oz bottle of beer (355 mL), one 5 oz glass of wine (148 mL), or one 1 oz glass of hard liquor (44 mL).  Lifestyle  Brush your teeth every morning and night with fluoride toothpaste. Floss one time each day.  Exercise for at least  30 minutes 5 or more days each week.  Do not use any products that contain nicotine or tobacco. These products include cigarettes, chewing tobacco, and vaping devices, such as e-cigarettes. If you need help quitting, ask your health care provider.  Do not use drugs.  If you are sexually active, practice safe sex. Use a condom or other form of protection to prevent STIs.  If you do not wish to become pregnant, use a form of birth control. If you plan to become pregnant, see your health care provider for a  prepregnancy visit.  Take aspirin only as told by your health care provider. Make sure that you understand how much to take and what form to take. Work with your health care provider to find out whether it is safe and beneficial for you to take aspirin daily.  Find healthy ways to manage stress, such as:  Meditation, yoga, or listening to music.  Journaling.  Talking to a trusted person.  Spending time with friends and family.  Minimize exposure to UV radiation to reduce your risk of skin cancer.  Safety  Always wear your seat belt while driving or riding in a vehicle.  Do not drive:  If you have been drinking alcohol. Do not ride with someone who has been drinking.  When you are tired or distracted.  While texting.  If you have been using any mind-altering substances or drugs.  Wear a helmet and other protective equipment during sports activities.  If you have firearms in your house, make sure you follow all gun safety procedures.  Seek help if you have been physically or sexually abused.  What's next?  Visit your health care provider once a year for an annual wellness visit.  Ask your health care provider how often you should have your eyes and teeth checked.  Stay up to date on all vaccines.  This information is not intended to replace advice given to you by your health care provider. Make sure you discuss any questions you have with your health care provider.  Document Revised: 07/25/2020 Document Reviewed: 07/25/2020  Elsevier Patient Education  2024 [employer].

## 2023-10-13 NOTE — Progress Notes (Unsigned)
 Name: Anita Newton   MRN: [id number]    DOB: [birth year]   Date:10/13/2023       Progress Note  Subjective  Chief Complaint  Chief Complaint  Patient presents with   Annual Exam    HPI  Patient presents for annual CPE.  Diet: oatmeal and fruit or cereal , snacks for lunch, and dinner at home  Exercise: she walks all day at work, going to [employer] 3 days a week for 30 minutes   Last Eye Exam: encouraged to complete Last Dental Exam: completed  Flowsheet Row Office Visit from 10/13/2023 in [facility]  AUDIT-C Score 0   Depression: Phq 9 is  negative    10/13/2023    1:09 PM 10/07/2022   12:22 PM 07/22/2022    2:32 PM 11/29/2021    3:46 PM 09/26/2021    9:16 AM  Depression screen PHQ 2/9  Decreased Interest 0 1 1 0 1  Down, Depressed, Hopeless 0 1 1 0 0  PHQ - 2 Score 0 2 2 0 1  Altered sleeping 0 1 3 0 3  Tired, decreased energy 0 2 3 0 1  Change in appetite 0 0 0 0 0  Feeling bad or failure about yourself  0 0 1 0 0  Trouble concentrating 0 1 0 0 0  Moving slowly or fidgety/restless 0 0 0 0 0  Suicidal thoughts 0 0 0 0 0  PHQ-9 Score 0 6 9 0 5  Difficult doing work/chores Not difficult at all Not difficult at all  Not difficult at all Not difficult at all   Hypertension: BP Readings from Last 3 Encounters:  10/13/23 138/82  10/07/22 118/76  07/22/22 118/80   Obesity: Wt Readings from Last 3 Encounters:  10/13/23 211 lb 3.2 oz (95.8 kg)  10/07/22 190 lb 11.2 oz (86.5 kg)  07/22/22 193 lb (87.5 kg)   BMI Readings from Last 3 Encounters:  10/13/23 39.91 kg/m  10/07/22 36.03 kg/m  07/22/22 37.69 kg/m     Vaccines: reviewed with the patient. Discussed yearly flu shot   Hep C Screening: completed STD testing and prevention (HIV/chl/gon/syphilis): today  Intimate partner violence: negative screen  Sexual History : same sexual partner, no pain , currently no vaginal discharge  Menstrual History/LMP/Abnormal Bleeding:  hysterectomy  Discussed importance of follow up if any post-menopausal bleeding: not applicable  Incontinence Symptoms: positive for symptoms , she has urinary urgency   Breast cancer:  - Last Mammogram: due for repeat January 2025  - BRCA gene screening: she had negative genetic screen  Osteoporosis Prevention : Discussed high calcium and vitamin D  supplementation, weight bearing exercises Bone density :not applicable   Cervical cancer screening: not applicable due to hysterectomy  Skin cancer: Discussed monitoring for atypical lesions  Colorectal cancer: next year    Lung cancer:  Low Dose CT Chest recommended if Age 46-80 years, 20 pack-year currently smoking OR have quit w/in 15years. Patient does not qualify for screen   ECG: 2022   Advanced Care Planning: A voluntary discussion about advance care planning including the explanation and discussion of advance directives.  Discussed health care proxy and Living will, and the patient was able to identify a health care proxy as Anita Newton - daughter .  Patient does not have a living will and power of attorney of health care   Patient Active Problem List   Diagnosis Date Noted   Congestive heart failure with right ventricular diastolic  dysfunction (HCC) 07/22/2022   Dysthymia 07/22/2022   Genetic testing 10/26/2020   Family history unknown 10/09/2020   Ovarian cyst 07/23/2020   S/P laparoscopic hysterectomy 11/15/2019   Menorrhagia with regular cycle    Peroneal tendinitis 10/31/2019   Synovitis and tenosynovitis 10/31/2019   Morbid obesity (HCC) 04/16/2018   B12 deficiency 07/08/2016   History of iron deficiency anemia 07/08/2016   Seasonal allergic rhinitis 05/16/2015   History of eclampsia 12/15/2014   Hypertension, benign 12/15/2014   Large breasts 12/15/2014   Obesity (BMI 35.0-39.9 without comorbidity) 12/15/2014   Thoracic back pain 12/15/2014   Vitamin D  deficiency 12/15/2014    Past Surgical History:  Procedure  Laterality Date   CYSTOSCOPY N/A 11/15/2019   Procedure: CYSTOSCOPY;  Surgeon: Anita Claudell SAUNDERS, MD;  Location: [facility];  Service: Gynecology;  Laterality: N/A;   DILATATION & CURETTAGE/HYSTEROSCOPY WITH MYOSURE N/A 10/06/2017   Procedure: DILATATION & CURETTAGE /HYSTEROSCOPY;  Surgeon: Anita Claudell SAUNDERS, MD;  Location: [facility];  Service: Gynecology;  Laterality: N/A;   EYE SURGERY Right 2004   eyelid laceration    LAPAROSCOPIC OVARIAN CYSTECTOMY N/A 07/24/2020   Procedure: Diagnostic laparoscopy, lysis of adhesions;  Surgeon: Anita Garnette BIRCH, MD;  Location: [facility];  Service: Gynecology;  Laterality: N/A;   TOTAL LAPAROSCOPIC HYSTERECTOMY WITH SALPINGECTOMY Bilateral 11/15/2019   Procedure: TOTAL LAPAROSCOPIC HYSTERECTOMY WITH SALPINGECTOMY;  Surgeon: Anita Claudell SAUNDERS, MD;  Location: [facility];  Service: Gynecology;  Laterality: Bilateral;   TUBAL LIGATION      Family History  Adopted: Yes  Problem Relation Age of Onset   Cancer Mother        eye tumor   Diabetes Mother    Lupus Mother    Alcohol abuse Father    Drug abuse Father    Lupus Sister    Depression Sister    Hypertension Brother    Eczema Daughter    ADD / ADHD Daughter    Learning disabilities Daughter    ADD / ADHD Son    Allergic Disorder Son    Birth defects Son    Cancer Sister    Intellectual disability Daughter    Breast cancer Neg Hx     Social History   Socioeconomic History   Marital status: Single    Spouse name: Not on file   Number of children: 3   Years of education: Not on file   Highest education level: Some college, no degree  Occupational History   Not on file  Tobacco Use   Smoking status: Never   Smokeless tobacco: Never  Vaping Use   Vaping status: Never Used  Substance and Sexual Activity   Alcohol use: Yes    Alcohol/week: 0.0 standard drinks of alcohol    Comment: socially   Drug use: No   Sexual activity: Yes    Birth control/protection: Condom, Injection,  Surgical    Comment: Tubal Ligation   Other Topics Concern   Not on file  Social History Narrative   Not on file   Social Drivers of Health   Financial Resource Strain: Low Risk  (10/13/2023)   Overall Financial Resource Strain (CARDIA)    Difficulty of Paying Living Expenses: Not hard at all  Food Insecurity: No Food Insecurity (10/13/2023)   Hunger Vital Sign    Worried About Running Out of Food in the Last Year: Never true    Ran Out of Food in the Last Year: Never true  Transportation Needs: No Transportation Needs (10/13/2023)  PRAPARE - Administrator, Civil Service (Medical): No    Lack of Transportation (Non-Medical): No  Physical Activity: Insufficiently Active (10/13/2023)   Exercise Vital Sign    Days of Exercise per Week: 3 days    Minutes of Exercise per Session: 30 min  Stress: Stress Concern Present (10/13/2023)   [employer] of Occupational Health - Occupational Stress Questionnaire    Feeling of Stress: To some extent  Social Connections: Socially Isolated (10/13/2023)   Social Connection and Isolation Panel    Frequency of Communication with Friends and Family: More than three times a week    Frequency of Social Gatherings with Friends and Family: Once a week    Attends Religious Services: Patient declined    Database administrator or Organizations: No    Attends Banker Meetings: Never    Marital Status: Divorced  Catering manager Violence: Not At Risk (10/13/2023)   Humiliation, Afraid, Rape, and Kick questionnaire    Fear of Current or Ex-Partner: No    Emotionally Abused: No    Physically Abused: No    Sexually Abused: No     Current Outpatient Medications:    ascorbic acid (VITAMIN C) 500 MG tablet, Take 500 mg by mouth daily., Disp: , Rfl:    Cholecalciferol (VITAMIN D ) 50 MCG (2000 UT) CAPS, Take 1 capsule by mouth daily at 12 noon., Disp: , Rfl:    Cyanocobalamin (B-12) 1000 MCG SUBL, Place 1 tablet under the tongue daily.,  Disp: 100 tablet, Rfl: 1   docusate sodium  (COLACE) 100 MG capsule, TAKE 1 CAPSULE (100 MG TOTAL) BY MOUTH 2 (TWO) TIMES DAILY AS NEEDED FOR MILD CONSTIPATION., Disp: 30 capsule, Rfl: 0   ibuprofen  (ADVIL ,MOTRIN ) 600 MG tablet, Take 1 tablet (600 mg total) by mouth every 6 (six) hours as needed for cramping. (Patient taking differently: Take 1,200 mg by mouth every 6 (six) hours as needed (chronic back pain).), Disp: 60 tablet, Rfl: 0   loratadine  (CLARITIN ) 10 MG tablet, Take 1 tablet (10 mg total) by mouth daily as needed for allergies., Disp: 30 tablet, Rfl: 11   montelukast  (SINGULAIR ) 10 MG tablet, Take 1 tablet (10 mg total) by mouth at bedtime. (Patient taking differently: Take 10 mg by mouth at bedtime as needed (allergies.).), Disp: 90 tablet, Rfl: 1   Multiple Vitamin (MULTIVITAMIN WITH MINERALS) TABS tablet, Take 1 tablet by mouth daily., Disp: , Rfl:    Omega-3 Fatty Acids (FISH OIL) 1000 MG CAPS, Take 1,000 mg by mouth every Sunday. , Disp: , Rfl:    simethicone  (MYLICON) 80 MG chewable tablet, Chew 1 tablet (80 mg total) by mouth 4 (four) times daily., Disp: 30 tablet, Rfl: 0   traZODone  (DESYREL ) 50 MG tablet, TAKE 0.5-1 TABLETS BY MOUTH AT BEDTIME AS NEEDED FOR SLEEP., Disp: 90 tablet, Rfl: 0  Allergies  Allergen Reactions   Cherry Hives and Swelling    Fresh cherries with a stem   Morphine  And Codeine     Pt doesn't like the way it makes her feel when coming off it. If has to have, will use.     ROS  Constitutional: Negative for fever, positive  weight change.  Respiratory: Negative for cough and shortness of breath.   Cardiovascular: Negative for chest pain or palpitations.  Gastrointestinal: Negative for abdominal pain, no bowel changes.  Musculoskeletal: Negative for gait problem or joint swelling.  Skin: Negative for rash.  Neurological: Negative for dizziness or headache.  No  other specific complaints in a complete review of systems (except as listed in HPI above).    Objective  Vitals:   10/13/23 1313  BP: 138/82  Pulse: 91  Resp: 16  SpO2: 99%  Weight: 211 lb 3.2 oz (95.8 kg)  Height: 5' 1 (1.549 m)    Body mass index is 39.91 kg/m.  Physical Exam  Constitutional: Patient appears well-developed and well-nourished. No distress.  HENT: Head: Normocephalic and atraumatic. Ears: B TMs ok, no erythema or effusion; Nose: Nose normal. Mouth/Throat: Oropharynx is clear and moist. No oropharyngeal exudate.  Eyes: Conjunctivae and EOM are normal. Pupils are equal, round, and reactive to light. No scleral icterus.  Neck: Normal range of motion. Neck supple. No JVD present. No thyromegaly present.  Cardiovascular: Normal rate, regular rhythm and normal heart sounds.  No murmur heard. No BLE edema. Pulmonary/Chest: Effort normal and breath sounds normal. No respiratory distress. Abdominal: Soft. Bowel sounds are normal, no distension. There is no tenderness. no masses Breast: no lumps or masses, no nipple discharge or rashes FEMALE GENITALIA:  Not done  RECTAL: not done  Musculoskeletal: Normal range of motion, no joint effusions. No gross deformities Neurological: he is alert and oriented to person, place, and time. No cranial nerve deficit. Coordination, balance, strength, speech and gait are normal.  Skin: Skin is warm and dry. No rash noted. No erythema.  Psychiatric: Patient has a normal mood and affect. behavior is normal. Judgment and thought content normal.   {Show previous labs (optional):23779}  Assessment & Plan  1. Well adult exam (Primary)  - Comprehensive metabolic panel with GFR - CBC with Differential/Platelet - Lipid panel - Hemoglobin A1c - B12 and Folate Panel - MM 3D SCREENING MAMMOGRAM BILATERAL BREAST; Future  2. B12 deficiency  - B12 and Folate Panel  3. Lipid screening  - Lipid panel  4. Diabetes mellitus screening  - Hemoglobin A1c  5. Hypertension, benign  - Comprehensive metabolic panel with GFR -  CBC with Differential/Platelet  6. Encounter for screening mammogram for malignant neoplasm of breast  - MM 3D SCREENING MAMMOGRAM BILATERAL BREAST; Future  7. Screening examination for STI  - Cervicovaginal ancillary only - RPR - HIV Antibody (routine testing w rflx)  8. Need for hepatitis A and B vaccination  - Hepatitis A hepatitis B combined vaccine IM   -USPSTF grade A and B recommendations reviewed with patient; age-appropriate recommendations, preventive care, screening tests, etc discussed and encouraged; healthy living encouraged; see AVS for patient education given to patient -Discussed importance of 150 minutes of physical activity weekly, eat two servings of fish weekly, eat one serving of tree nuts ( cashews, pistachios, pecans, almonds.SABRA) every other day, eat 6 servings of fruit/vegetables daily and drink plenty of water and avoid sweet beverages.   -Reviewed Health Maintenance: Yes.

## 2023-10-14 ENCOUNTER — Encounter: Admitting: Family Medicine

## 2023-10-14 LAB — HIV ANTIBODY (ROUTINE TESTING W REFLEX): HIV 1&2 Ab, 4th Generation: NONREACTIVE

## 2023-10-14 LAB — COMPREHENSIVE METABOLIC PANEL WITH GFR
AG Ratio: 1.6 (calc) (ref 1.0–2.5)
ALT: 17 U/L (ref 6–29)
AST: 18 U/L (ref 10–30)
Albumin: 4.4 g/dL (ref 3.6–5.1)
Alkaline phosphatase (APISO): 67 U/L (ref 31–125)
BUN: 11 mg/dL (ref 7–25)
CO2: 27 mmol/L (ref 20–32)
Calcium: 9.3 mg/dL (ref 8.6–10.2)
Chloride: 103 mmol/L (ref 98–110)
Creat: 0.71 mg/dL (ref 0.50–0.99)
Globulin: 2.7 g/dL (ref 1.9–3.7)
Glucose, Bld: 89 mg/dL (ref 65–99)
Potassium: 3.7 mmol/L (ref 3.5–5.3)
Sodium: 139 mmol/L (ref 135–146)
Total Bilirubin: 0.5 mg/dL (ref 0.2–1.2)
Total Protein: 7.1 g/dL (ref 6.1–8.1)
eGFR: 107 mL/min/1.73m2 (ref >=60)

## 2023-10-14 LAB — CBC WITH DIFFERENTIAL/PLATELET
Absolute Lymphocytes: 2723 {cells}/uL (ref 850–3900)
Absolute Monocytes: 553 {cells}/uL (ref 200–950)
Basophils Absolute: 28 {cells}/uL (ref 0–200)
Basophils Relative: 0.4 %
Eosinophils Absolute: 112 {cells}/uL (ref 15–500)
Eosinophils Relative: 1.6 %
HCT: 34.5 % — ABNORMAL LOW (ref 35.0–45.0)
Hemoglobin: 11.3 g/dL — ABNORMAL LOW (ref 11.7–15.5)
MCH: 28.9 pg (ref 27.0–33.0)
MCHC: 32.8 g/dL (ref 32.0–36.0)
MCV: 88.2 fL (ref 80.0–100.0)
MPV: 10.3 fL (ref 7.5–12.5)
Monocytes Relative: 7.9 %
Neutro Abs: 3584 {cells}/uL (ref 1500–7800)
Neutrophils Relative %: 51.2 %
Platelets: 333 Thousand/uL (ref 140–400)
RBC: 3.91 Million/uL (ref 3.80–5.10)
RDW: 12.6 % (ref 11.0–15.0)
Total Lymphocyte: 38.9 %
WBC: 7 Thousand/uL (ref 3.8–10.8)

## 2023-10-14 LAB — HEMOGLOBIN A1C
Hgb A1c MFr Bld: 5.6 % (ref <5.7)
Mean Plasma Glucose: 114 mg/dL
eAG (mmol/L): 6.3 mmol/L

## 2023-10-14 LAB — LIPID PANEL
Cholesterol: 161 mg/dL (ref <200)
HDL: 58 mg/dL (ref >=50)
LDL Cholesterol (Calc): 85 mg/dL
Non-HDL Cholesterol (Calc): 103 mg/dL (ref <130)
Total CHOL/HDL Ratio: 2.8 (calc) (ref <5.0)
Triglycerides: 86 mg/dL (ref <150)

## 2023-10-14 LAB — B12 AND FOLATE PANEL
Folate: 18 ng/mL
Vitamin B-12: 390 pg/mL (ref 200–1100)

## 2023-10-14 LAB — RPR: RPR Ser Ql: NONREACTIVE

## 2023-10-15 ENCOUNTER — Ambulatory Visit: Payer: Self-pay | Admitting: Family Medicine

## 2023-10-15 LAB — CERVICOVAGINAL ANCILLARY ONLY
Bacterial Vaginitis (gardnerella): POSITIVE — AB
Candida Glabrata: NEGATIVE
Candida Vaginitis: NEGATIVE
Chlamydia: NEGATIVE
Comment: NEGATIVE
Comment: NEGATIVE
Comment: NEGATIVE
Comment: NEGATIVE
Comment: NEGATIVE
Comment: NORMAL
Neisseria Gonorrhea: NEGATIVE
Trichomonas: NEGATIVE

## 2023-10-19 ENCOUNTER — Other Ambulatory Visit: Payer: Self-pay | Admitting: Family Medicine

## 2023-10-19 MED ORDER — METRONIDAZOLE 250 MG PO TABS: 250.0000 mg | ORAL_TABLET | Freq: Two times a day (BID) | ORAL | 0 refills | Status: AC

## 2023-10-22 ENCOUNTER — Telehealth: Admitting: Physician Assistant

## 2023-10-22 DIAGNOSIS — J4521 Mild intermittent asthma with (acute) exacerbation: Secondary | ICD-10-CM

## 2023-10-23 MED ORDER — PREDNISONE 20 MG PO TABS: 40.0000 mg | ORAL_TABLET | Freq: Every day | ORAL | 0 refills | Status: AC

## 2023-10-23 MED ORDER — ALBUTEROL SULFATE HFA 108 (90 BASE) MCG/ACT IN AERS: 1.0000 | INHALATION_SPRAY | Freq: Four times a day (QID) | RESPIRATORY_TRACT | 0 refills | Status: AC | PRN

## 2023-10-23 NOTE — Progress Notes (Signed)

## 2024-04-14 ENCOUNTER — Ambulatory Visit: Admitting: Family Medicine
